# Patient Record
Sex: Female | Born: 1940
Health system: Southern US, Community
[De-identification: ages and names within clinical notes are randomized; demographics above are authoritative.]

## PROBLEM LIST (undated history)

## (undated) DIAGNOSIS — E119 Type 2 diabetes mellitus without complications: Secondary | ICD-10-CM

## (undated) DIAGNOSIS — G4730 Sleep apnea, unspecified: Secondary | ICD-10-CM

## (undated) DIAGNOSIS — J45909 Unspecified asthma, uncomplicated: Secondary | ICD-10-CM

## (undated) DIAGNOSIS — I219 Acute myocardial infarction, unspecified: Secondary | ICD-10-CM

## (undated) DIAGNOSIS — N289 Disorder of kidney and ureter, unspecified: Secondary | ICD-10-CM

## (undated) DIAGNOSIS — I10 Essential (primary) hypertension: Secondary | ICD-10-CM

## (undated) DIAGNOSIS — E785 Hyperlipidemia, unspecified: Secondary | ICD-10-CM

## (undated) HISTORY — DX: Hyperlipidemia, unspecified: E78.5

## (undated) HISTORY — PX: CAROTID STENT: SHX1301

## (undated) HISTORY — PX: TUBAL LIGATION: SHX77

## (undated) HISTORY — DX: Sleep apnea, unspecified: G47.30

## (undated) HISTORY — DX: Disorder of kidney and ureter, unspecified: N28.9

## (undated) HISTORY — DX: Type 2 diabetes mellitus without complications: E11.9

## (undated) HISTORY — PX: VESICOVAGINAL FISTULA CLOSURE W/ TAH: SUR271

## (undated) HISTORY — PX: CHOLECYSTECTOMY: SHX55

## (undated) HISTORY — DX: Unspecified asthma, uncomplicated: J45.909

## (undated) HISTORY — DX: Acute myocardial infarction, unspecified: I21.9

## (undated) HISTORY — DX: Essential (primary) hypertension: I10

## (undated) HISTORY — PX: ABDOMINAL HYSTERECTOMY: SHX81

---

## 1998-03-14 ENCOUNTER — Ambulatory Visit (HOSPITAL_COMMUNITY): Admission: RE | Admit: 1998-03-14 | Discharge: 1998-03-14 | Payer: Self-pay | Admitting: *Deleted

## 1998-10-31 ENCOUNTER — Ambulatory Visit (HOSPITAL_COMMUNITY): Admission: RE | Admit: 1998-10-31 | Discharge: 1998-10-31 | Payer: Self-pay | Admitting: *Deleted

## 1999-05-29 ENCOUNTER — Ambulatory Visit (HOSPITAL_COMMUNITY): Admission: RE | Admit: 1999-05-29 | Discharge: 1999-05-29 | Payer: Self-pay | Admitting: *Deleted

## 2000-02-19 ENCOUNTER — Ambulatory Visit (HOSPITAL_COMMUNITY): Admission: RE | Admit: 2000-02-19 | Discharge: 2000-02-19 | Payer: Self-pay | Admitting: *Deleted

## 2001-07-09 ENCOUNTER — Other Ambulatory Visit: Admission: RE | Admit: 2001-07-09 | Discharge: 2001-07-09 | Payer: Self-pay | Admitting: Obstetrics and Gynecology

## 2003-01-18 ENCOUNTER — Emergency Department (HOSPITAL_COMMUNITY): Admission: EM | Admit: 2003-01-18 | Discharge: 2003-01-18 | Payer: Self-pay | Admitting: Emergency Medicine

## 2003-01-18 ENCOUNTER — Other Ambulatory Visit: Admission: RE | Admit: 2003-01-18 | Discharge: 2003-01-18 | Payer: Self-pay | Admitting: Obstetrics and Gynecology

## 2004-02-16 ENCOUNTER — Other Ambulatory Visit: Admission: RE | Admit: 2004-02-16 | Discharge: 2004-02-16 | Payer: Self-pay | Admitting: Obstetrics and Gynecology

## 2004-02-20 ENCOUNTER — Encounter: Admission: RE | Admit: 2004-02-20 | Discharge: 2004-02-20 | Payer: Self-pay | Admitting: Obstetrics and Gynecology

## 2011-08-06 DIAGNOSIS — Z794 Long term (current) use of insulin: Secondary | ICD-10-CM | POA: Insufficient documentation

## 2011-08-06 DIAGNOSIS — Z8719 Personal history of other diseases of the digestive system: Secondary | ICD-10-CM

## 2011-08-06 DIAGNOSIS — N184 Chronic kidney disease, stage 4 (severe): Secondary | ICD-10-CM | POA: Insufficient documentation

## 2011-08-06 DIAGNOSIS — I701 Atherosclerosis of renal artery: Secondary | ICD-10-CM | POA: Insufficient documentation

## 2011-08-06 DIAGNOSIS — G4733 Obstructive sleep apnea (adult) (pediatric): Secondary | ICD-10-CM | POA: Insufficient documentation

## 2011-08-06 DIAGNOSIS — E119 Type 2 diabetes mellitus without complications: Secondary | ICD-10-CM

## 2011-08-06 DIAGNOSIS — J4530 Mild persistent asthma, uncomplicated: Secondary | ICD-10-CM

## 2011-08-06 DIAGNOSIS — I129 Hypertensive chronic kidney disease with stage 1 through stage 4 chronic kidney disease, or unspecified chronic kidney disease: Secondary | ICD-10-CM

## 2011-08-06 HISTORY — DX: Type 2 diabetes mellitus without complications: Z79.4

## 2011-08-06 HISTORY — DX: Mild persistent asthma, uncomplicated: J45.30

## 2011-08-06 HISTORY — DX: Type 2 diabetes mellitus without complications: E11.9

## 2011-08-06 HISTORY — DX: Chronic kidney disease, stage 4 (severe): N18.4

## 2011-08-06 HISTORY — DX: Hypertensive chronic kidney disease with stage 1 through stage 4 chronic kidney disease, or unspecified chronic kidney disease: I12.9

## 2011-08-06 HISTORY — DX: Obstructive sleep apnea (adult) (pediatric): G47.33

## 2011-08-06 HISTORY — DX: Personal history of other diseases of the digestive system: Z87.19

## 2011-09-24 DIAGNOSIS — E278 Other specified disorders of adrenal gland: Secondary | ICD-10-CM

## 2011-09-24 HISTORY — DX: Other specified disorders of adrenal gland: E27.8

## 2012-01-02 DIAGNOSIS — D509 Iron deficiency anemia, unspecified: Secondary | ICD-10-CM

## 2012-01-02 HISTORY — DX: Hypomagnesemia: E83.42

## 2012-01-02 HISTORY — DX: Iron deficiency anemia, unspecified: D50.9

## 2012-01-22 ENCOUNTER — Encounter (INDEPENDENT_AMBULATORY_CARE_PROVIDER_SITE_OTHER): Payer: Self-pay | Admitting: Ophthalmology

## 2012-01-29 ENCOUNTER — Encounter (INDEPENDENT_AMBULATORY_CARE_PROVIDER_SITE_OTHER): Payer: Medicare Other | Admitting: Ophthalmology

## 2012-01-29 DIAGNOSIS — H43819 Vitreous degeneration, unspecified eye: Secondary | ICD-10-CM

## 2012-01-29 DIAGNOSIS — H2510 Age-related nuclear cataract, unspecified eye: Secondary | ICD-10-CM

## 2012-01-29 DIAGNOSIS — H35039 Hypertensive retinopathy, unspecified eye: Secondary | ICD-10-CM

## 2012-01-29 DIAGNOSIS — H35059 Retinal neovascularization, unspecified, unspecified eye: Secondary | ICD-10-CM

## 2012-01-29 DIAGNOSIS — D3130 Benign neoplasm of unspecified choroid: Secondary | ICD-10-CM

## 2012-01-29 DIAGNOSIS — E11319 Type 2 diabetes mellitus with unspecified diabetic retinopathy without macular edema: Secondary | ICD-10-CM

## 2012-02-26 ENCOUNTER — Encounter (INDEPENDENT_AMBULATORY_CARE_PROVIDER_SITE_OTHER): Payer: Medicare Other | Admitting: Ophthalmology

## 2012-02-26 DIAGNOSIS — E11319 Type 2 diabetes mellitus with unspecified diabetic retinopathy without macular edema: Secondary | ICD-10-CM

## 2012-02-26 DIAGNOSIS — H43399 Other vitreous opacities, unspecified eye: Secondary | ICD-10-CM

## 2012-02-26 DIAGNOSIS — H35039 Hypertensive retinopathy, unspecified eye: Secondary | ICD-10-CM

## 2012-02-26 DIAGNOSIS — H2510 Age-related nuclear cataract, unspecified eye: Secondary | ICD-10-CM

## 2012-02-26 DIAGNOSIS — E1165 Type 2 diabetes mellitus with hyperglycemia: Secondary | ICD-10-CM

## 2012-02-26 DIAGNOSIS — E1139 Type 2 diabetes mellitus with other diabetic ophthalmic complication: Secondary | ICD-10-CM

## 2012-02-26 DIAGNOSIS — I10 Essential (primary) hypertension: Secondary | ICD-10-CM

## 2012-02-26 DIAGNOSIS — H35059 Retinal neovascularization, unspecified, unspecified eye: Secondary | ICD-10-CM

## 2012-03-04 ENCOUNTER — Ambulatory Visit (INDEPENDENT_AMBULATORY_CARE_PROVIDER_SITE_OTHER): Payer: Medicare Other | Admitting: Ophthalmology

## 2012-03-04 DIAGNOSIS — H3530 Unspecified macular degeneration: Secondary | ICD-10-CM

## 2012-03-04 DIAGNOSIS — H35039 Hypertensive retinopathy, unspecified eye: Secondary | ICD-10-CM

## 2012-03-04 DIAGNOSIS — E11319 Type 2 diabetes mellitus with unspecified diabetic retinopathy without macular edema: Secondary | ICD-10-CM

## 2012-03-04 DIAGNOSIS — H35059 Retinal neovascularization, unspecified, unspecified eye: Secondary | ICD-10-CM

## 2012-04-08 ENCOUNTER — Encounter (INDEPENDENT_AMBULATORY_CARE_PROVIDER_SITE_OTHER): Payer: Medicare Other | Admitting: Ophthalmology

## 2012-04-08 DIAGNOSIS — H35059 Retinal neovascularization, unspecified, unspecified eye: Secondary | ICD-10-CM

## 2012-07-15 ENCOUNTER — Encounter (INDEPENDENT_AMBULATORY_CARE_PROVIDER_SITE_OTHER): Payer: Medicare Other | Admitting: Ophthalmology

## 2012-07-15 DIAGNOSIS — H2510 Age-related nuclear cataract, unspecified eye: Secondary | ICD-10-CM

## 2012-07-15 DIAGNOSIS — E1139 Type 2 diabetes mellitus with other diabetic ophthalmic complication: Secondary | ICD-10-CM

## 2012-07-15 DIAGNOSIS — H35039 Hypertensive retinopathy, unspecified eye: Secondary | ICD-10-CM

## 2012-07-15 DIAGNOSIS — I10 Essential (primary) hypertension: Secondary | ICD-10-CM

## 2012-07-15 DIAGNOSIS — H43819 Vitreous degeneration, unspecified eye: Secondary | ICD-10-CM

## 2012-07-15 DIAGNOSIS — H35059 Retinal neovascularization, unspecified, unspecified eye: Secondary | ICD-10-CM

## 2012-07-15 DIAGNOSIS — E11319 Type 2 diabetes mellitus with unspecified diabetic retinopathy without macular edema: Secondary | ICD-10-CM

## 2012-08-26 DIAGNOSIS — I219 Acute myocardial infarction, unspecified: Secondary | ICD-10-CM | POA: Insufficient documentation

## 2012-08-26 HISTORY — DX: Acute myocardial infarction, unspecified: I21.9

## 2013-01-13 ENCOUNTER — Ambulatory Visit (INDEPENDENT_AMBULATORY_CARE_PROVIDER_SITE_OTHER): Payer: Medicare Other | Admitting: Ophthalmology

## 2013-01-13 DIAGNOSIS — I10 Essential (primary) hypertension: Secondary | ICD-10-CM

## 2013-01-13 DIAGNOSIS — H43819 Vitreous degeneration, unspecified eye: Secondary | ICD-10-CM

## 2013-01-13 DIAGNOSIS — H35039 Hypertensive retinopathy, unspecified eye: Secondary | ICD-10-CM

## 2013-01-13 DIAGNOSIS — E11319 Type 2 diabetes mellitus with unspecified diabetic retinopathy without macular edema: Secondary | ICD-10-CM

## 2013-01-13 DIAGNOSIS — E1165 Type 2 diabetes mellitus with hyperglycemia: Secondary | ICD-10-CM

## 2013-01-22 DIAGNOSIS — E559 Vitamin D deficiency, unspecified: Secondary | ICD-10-CM

## 2013-01-22 HISTORY — DX: Vitamin D deficiency, unspecified: E55.9

## 2013-07-26 ENCOUNTER — Ambulatory Visit (INDEPENDENT_AMBULATORY_CARE_PROVIDER_SITE_OTHER): Payer: Medicare Other | Admitting: Ophthalmology

## 2014-01-07 ENCOUNTER — Encounter (INDEPENDENT_AMBULATORY_CARE_PROVIDER_SITE_OTHER): Payer: Medicare Other | Admitting: Ophthalmology

## 2014-01-07 DIAGNOSIS — E1139 Type 2 diabetes mellitus with other diabetic ophthalmic complication: Secondary | ICD-10-CM

## 2014-01-07 DIAGNOSIS — E1165 Type 2 diabetes mellitus with hyperglycemia: Secondary | ICD-10-CM

## 2014-01-07 DIAGNOSIS — H35039 Hypertensive retinopathy, unspecified eye: Secondary | ICD-10-CM

## 2014-01-07 DIAGNOSIS — H43819 Vitreous degeneration, unspecified eye: Secondary | ICD-10-CM

## 2014-01-07 DIAGNOSIS — H3530 Unspecified macular degeneration: Secondary | ICD-10-CM

## 2014-01-07 DIAGNOSIS — I10 Essential (primary) hypertension: Secondary | ICD-10-CM

## 2014-01-07 DIAGNOSIS — E11319 Type 2 diabetes mellitus with unspecified diabetic retinopathy without macular edema: Secondary | ICD-10-CM

## 2014-08-04 ENCOUNTER — Ambulatory Visit (INDEPENDENT_AMBULATORY_CARE_PROVIDER_SITE_OTHER): Payer: Medicare Other | Admitting: Internal Medicine

## 2014-08-04 ENCOUNTER — Encounter: Payer: Self-pay | Admitting: Internal Medicine

## 2014-08-04 VITALS — BP 102/58 | HR 78 | Ht 68.0 in | Wt 174.0 lb

## 2014-08-04 DIAGNOSIS — G4700 Insomnia, unspecified: Secondary | ICD-10-CM

## 2014-08-04 DIAGNOSIS — R0609 Other forms of dyspnea: Secondary | ICD-10-CM

## 2014-08-04 DIAGNOSIS — G4733 Obstructive sleep apnea (adult) (pediatric): Secondary | ICD-10-CM

## 2014-08-04 DIAGNOSIS — R0600 Dyspnea, unspecified: Secondary | ICD-10-CM

## 2014-08-04 HISTORY — DX: Insomnia, unspecified: G47.00

## 2014-08-04 HISTORY — DX: Other forms of dyspnea: R06.09

## 2014-08-04 HISTORY — DX: Dyspnea, unspecified: R06.00

## 2014-08-04 NOTE — Assessment & Plan Note (Signed)
She had mild obstructive sleep apnea in 2012 but failed to tolerate CPAP. It doesn't sound as if she got adequate instruction or adjustment from [place]. She is not eager to try again with the different homecare company. I discussed the availability of oral appliances and she would like to learn more about this. Plan-orthodontic referral to Dr. Ron Parker for consideration of oral appliance to treat sleep apnea.

## 2014-08-04 NOTE — Patient Instructions (Signed)
Order- referral to orthodontist Dr Ron Parker to consider oral appliance for OSA  Try otc melatonin 3-6 mg, about an hour before bedtime  Try otc benadryl/ diphenhydramine a few minutes before bedtime to help sleep.

## 2014-08-04 NOTE — Progress Notes (Signed)
08/04/14- 89 yoF never smoker referred courtesy of Dr Allena Katz; had sleep study about 2-3 years ago at [facility]. NPSG 12/05/10([place]) AHI 14.2/ hr, weight 178 lbs.  She tried CPAP but did not tolerate fullface mask and feels she got inadequate support from [place] at that time. She describes difficulty initiating sleep, more than repeated waking Husband tells her she snores. Some daytime sleepiness if sitting quietly. One cup of coffee. Occasional nap is helpful. He never takes sleeping pill. Bedtime between 1 and 2 AM when her husband gets home from his second shift job. Sleep latency 1 hour, waking 2 or 3 times before up at 9 AM. History of TMJ surgery, HBP, MI/stent, DM, kidney disease, gout. Additional Problem: She asks why she scored poorly on office spirometry at Dr Bruna Potter. History of repeated pneumonia when she was Telesia Ates.  Prior to Admission medications   Medication Sig Start Date End Date Taking? Authorizing Provider  albuterol (PROVENTIL HFA;VENTOLIN HFA) 108 (90 BASE) MCG/ACT inhaler Inhale 2 puffs into the lungs every 6 (six) hours as needed for wheezing or shortness of breath.   Yes Historical Provider, MD  allopurinol (ZYLOPRIM) 100 MG tablet Take 100 mg by mouth daily. 07/20/14  Yes Historical Provider, MD  amLODipine (NORVASC) 5 MG tablet Take 5 mg by mouth 2 (two) times daily. 06/06/14  Yes Historical Provider, MD  aspirin 81 MG tablet Take 81 mg by mouth daily.   Yes Historical Provider, MD  atorvastatin (LIPITOR) 80 MG tablet Take 80 mg by mouth at bedtime. 07/09/14  Yes Historical Provider, MD  B-D UF III MINI PEN NEEDLES 31G X 5 MM MISC  07/09/14  Yes Historical Provider, MD  BRILINTA 90 MG TABS tablet Take 90 mg by mouth 2 (two) times daily. 06/17/14  Yes Historical Provider, MD  calcium carbonate (OS-CAL) 600 MG TABS tablet Take 600 mg by mouth daily with breakfast.   Yes Historical Provider, MD  carvedilol (COREG) 12.5 MG tablet Take 12.5 mg by mouth 2 (two)  times daily. 07/09/14  Yes Historical Provider, MD  Cholecalciferol (VITAMIN D) 2000 UNITS CAPS Take 1 capsule by mouth daily.   Yes Historical Provider, MD  clindamycin (CLEOCIN) 300 MG capsule Take 300 mg by mouth 3 (three) times daily. 07/30/14  Yes Historical Provider, MD  Coenzyme Q10 (COQ10) 200 MG CAPS Take 1 capsule by mouth daily.   Yes Historical Provider, MD  COLCRYS 0.6 MG tablet  07/30/14  Yes Historical Provider, MD  furosemide (LASIX) 40 MG tablet  06/06/14  Yes Historical Provider, MD  HUMALOG KWIKPEN 100 UNIT/ML KiwkPen  07/11/14  Yes Historical Provider, MD  hydrALAZINE (APRESOLINE) 50 MG tablet  07/23/14  Yes Historical Provider, MD  HYDROcodone-acetaminophen (NORCO/VICODIN) 5-325 MG per tablet Take 1 tablet by mouth 2 (two) times daily as needed. 07/30/14  Yes Historical Provider, MD  LANTUS SOLOSTAR 100 UNIT/ML Solostar Pen  06/17/14  Yes Historical Provider, MD  losartan (COZAAR) 50 MG tablet  06/16/14  Yes Historical Provider, MD  montelukast (SINGULAIR) 10 MG tablet Take 10 mg by mouth daily. 06/06/14  Yes Historical Provider, MD  Multiple Vitamin (MULTIVITAMIN) tablet Take 1 tablet by mouth daily.   Yes Historical Provider, MD  NITROSTAT 0.4 MG SL tablet  07/11/14  Yes Historical Provider, MD  QVAR 80 MCG/ACT inhaler  05/23/14  Yes Historical Provider, MD   Past Medical History  Diagnosis Date  . Hypertension   . Heart attack   . Asthma   . Diabetes   .  Hyperlipidemia   . Kidney disease   . Sleep apnea    Past Surgical History  Procedure Laterality Date  . Cholecystectomy    . Carotid stent    . Vesicovaginal fistula closure w/ tah    . Tubal ligation     Family History  Problem Relation Age of Onset  . Heart disease Father   . Heart disease Mother   . Heart disease Maternal Grandfather   . Heart disease Maternal Grandmother   . Heart disease Paternal Grandfather   . Heart disease Paternal Grandmother   . Lung cancer Brother   . Diabetes Sister   .  Diabetes Sister    History   Social History  . Marital Status: Married    Spouse Name: N/A    Number of Children: N/A  . Years of Education: N/A   Occupational History  . retired    Social History Main Topics  . Smoking status: Never Smoker   . Smokeless tobacco: Not on file  . Alcohol Use: No  . Drug Use: No  . Sexual Activity: Not on file   Other Topics Concern  . Not on file   Social History Narrative  . No narrative on file   ROS-see HPI Constitutional:   No-   weight loss, night sweats, fevers, chills, fatigue, lassitude. HEENT:   No-  headaches, difficulty swallowing, tooth/dental problems, sore throat,       No-  sneezing, itching, ear ache, + nasal congestion, post nasal drip,  CV:  No-   chest pain, orthopnea, PND, + swelling in lower extremities, anasarca,                                  dizziness, + palpitations Resp: No-   shortness of breath with exertion or at rest.              No-   productive cough,  No non-productive cough,  No- coughing up of blood.              No-   change in color of mucus.  No- wheezing.   Skin: No-   rash or lesions. GI:  No-   heartburn, + indigestion, abdominal pain, nausea, vomiting, diarrhea,                 change in bowel habits, loss of appetite GU: No-   dysuria, change in color of urine, no urgency or frequency.  No- flank pain. MS:  + joint pain or swelling.  No- decreased range of motion.  No- back pain. Neuro-     nothing unusual Psych:  No- change in mood or affect. No depression or anxiety.  No memory loss.  OBJ- Physical Exam General- Alert, Oriented, Affect-appropriate, Distress- none acute Skin- rash-none, lesions- none, excoriation- none Lymphadenopathy- none Head- atraumatic            Eyes- Gross vision intact, PERRLA, conjunctivae and secretions clear            Ears- Hearing, canals-normal            Nose- Clear, no-Septal dev, mucus, polyps, erosion, perforation             Throat- Mallampati III ,  mucosa clear , drainage- none, tonsils- atrophic Neck- flexible , trachea midline, no stridor , thyroid nl, carotid no bruit Chest - symmetrical excursion , unlabored  Heart/CV- RRR , no murmur , no gallop  , no rub, nl s1 s2                           - JVD- none , edema- none, stasis changes- none, varices- none           Lung- clear to P&A, wheeze- none, cough- none , dullness-none, rub- none           Chest wall-  Abd- tender-no, distended-no, bowel sounds-present, HSM- no Br/ Gen/ Rectal- Not done, not indicated Extrem- cyanosis- none, clubbing, none, atrophy- none, strength- nl. L big toe red in shoe, c/w gout Neuro- grossly intact to observation

## 2014-08-04 NOTE — Assessment & Plan Note (Signed)
She was under the impression that we were to evaluate her breathing. This was not communicated from [place], who has already been analyzing her breathing and has referred her to cardiology. Plan-we can assess from a pulmonary standpoint if requested.

## 2014-08-04 NOTE — Assessment & Plan Note (Signed)
Despite staying up late habitually, to wait for her husband, she reports needing as much as an hour more to fall asleep and waking 2 or 3 times during the night with no specific disturbance recognized. Plan-educate on sleep hygiene. Try melatonin and/or diphenhydramine to help sleep schedule and sleep onset.

## 2014-10-07 ENCOUNTER — Encounter: Payer: Self-pay | Admitting: Internal Medicine

## 2014-10-07 ENCOUNTER — Other Ambulatory Visit: Payer: Medicare Other

## 2014-10-07 ENCOUNTER — Ambulatory Visit (INDEPENDENT_AMBULATORY_CARE_PROVIDER_SITE_OTHER): Payer: Medicare Other | Admitting: Internal Medicine

## 2014-10-07 VITALS — BP 138/60 | HR 79 | Ht 68.0 in | Wt 174.0 lb

## 2014-10-07 DIAGNOSIS — R0609 Other forms of dyspnea: Secondary | ICD-10-CM

## 2014-10-07 DIAGNOSIS — G4733 Obstructive sleep apnea (adult) (pediatric): Secondary | ICD-10-CM

## 2014-10-07 DIAGNOSIS — J432 Centrilobular emphysema: Secondary | ICD-10-CM

## 2014-10-07 DIAGNOSIS — K219 Gastro-esophageal reflux disease without esophagitis: Secondary | ICD-10-CM

## 2014-10-07 HISTORY — DX: Gastro-esophageal reflux disease without esophagitis: K21.9

## 2014-10-07 NOTE — Assessment & Plan Note (Signed)
Grew up in a smoking family. She was told by Dr. Neldon Mc that her breathing scores were "20% down".   Plan-formal PFT. Alpha I antitrypsin assay

## 2014-10-07 NOTE — Progress Notes (Signed)
08/04/14- 20 yoF never smoker referred courtesy of Dr Allena Katz; had sleep study about 2-3 years ago at [facility]. NPSG 12/05/10([place]) AHI 14.2/ hr, weight 178 lbs.  She tried CPAP but did not tolerate fullface mask and feels she got inadequate support from [place] at that time. She describes difficulty initiating sleep, more than repeated waking Husband tells her she snores. Some daytime sleepiness if sitting quietly. One cup of coffee. Occasional nap is helpful. He never takes sleeping pill. Bedtime between 1 and 2 AM when her husband gets home from his second shift job. Sleep latency 1 hour, waking 2 or 3 times before up at 9 AM. History of TMJ surgery, HBP, MI/stent, DM, kidney disease, gout. Additional Problem: She asks why she scored poorly on office spirometry at Dr Bruna Potter. 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  10/07/14- 74 yoF never smoker referred courtesy of Dr Allena Katz; had sleep study about 2-3 years ago at [place]: Pt has not yet seen Dr. Ron Parker d/t insurance not covering visit. Pt denies any changes. Pt stated she is to see Dr. Neldon Mc next week.  She is now interested in trying CPAP with a nasal pillows mask which she thinks she could tolerate better. Aware of reflux. Says cardiologist told her to stop acid blocker because it was not good for her heart (?). We discussed reflux precautions. Ex line family history of COPD and lung cancer. She grew up in a smoking family. Asks again about her lung status-reporting some wheeze and cough  ROS Constitutional:   No-   weight loss, night sweats, fevers, chills, fatigue, lassitude. HEENT:   No-  headaches, difficulty swallowing, tooth/dental problems, sore throat,       No-  sneezing, itching, ear ache, + nasal congestion, post nasal drip,  CV:  No-   chest pain, orthopnea, PND, + swelling in lower extremities, anasarca,                                  dizziness, + palpitations Resp: No-    shortness of breath with exertion or at rest.              No-   productive cough,  No non-productive cough,  No- coughing up of blood.              No-   change in color of mucus.  No- wheezing.   Skin: No-   rash or lesions. GI:  No-   heartburn, + indigestion, abdominal pain, nausea, vomiting,  GU:  MS:  + joint pain or swelling.   Neuro-     nothing unusual Psych:  No- change in mood or affect. No depression or anxiety.  No memory loss.  OBJ- Physical Exam General- Alert, Oriented, Affect-appropriate, Distress- none acute Skin- rash-none, lesions- none, excoriation- none Lymphadenopathy- none Head- atraumatic            Eyes- Gross vision intact, PERRLA, conjunctivae and secretions clear            Ears- Hearing, canals-normal            Nose- Clear, no-Septal dev, mucus, polyps, erosion, perforation             Throat- Mallampati III , mucosa clear , drainage- none, tonsils- atrophic Neck- flexible , trachea midline, no stridor , thyroid nl, carotid no bruit Chest - symmetrical excursion , unlabored  Heart/CV- RRR , no murmur , no gallop  , no rub, nl s1 s2                           - JVD- none , edema- none, stasis changes- none, varices- none           Lung- clear to P&A, wheeze- none, cough- none , dullness-none, rub- none           Chest wall-  Abd-  Br/ Gen/ Rectal- Not done, not indicated Extrem- cyanosis- none, clubbing, none, atrophy- none, strength- nl. Neuro- grossly intact to observation

## 2014-10-07 NOTE — Patient Instructions (Signed)
Order- new DME new CPAP auto 5-15 nasal pillows mask   Dx OSA based on NPSG in 2012 (in media)  Order- Schedule PFT   Dx COPD with centrilobular emphysema              Lab    a1AT assay

## 2014-10-07 NOTE — Assessment & Plan Note (Signed)
Insurance would not cover oral appliance. Plan-discussed CPAP and options. Start CPAP with auto titration and nasal pillows mask

## 2014-10-07 NOTE — Assessment & Plan Note (Signed)
Says her cardiologist took her off of her acid blocker. She thought the problem was confusion of symptoms between reflux and heart pain. I asked her to clarify this and meanwhile emphasized reflux precautions.

## 2014-10-11 ENCOUNTER — Telehealth: Payer: Self-pay | Admitting: Internal Medicine

## 2014-10-11 DIAGNOSIS — G4733 Obstructive sleep apnea (adult) (pediatric): Secondary | ICD-10-CM

## 2014-10-11 NOTE — Telephone Encounter (Signed)
Spoke with representative at [facility] and she needs order sent that states " All Related CPAP supplies" .  Order placed.

## 2014-10-13 LAB — ALPHA-1 ANTITRYPSIN PHENOTYPE: A-1 Antitrypsin: 123 mg/dL (ref 83–199)

## 2014-11-07 ENCOUNTER — Ambulatory Visit (INDEPENDENT_AMBULATORY_CARE_PROVIDER_SITE_OTHER): Payer: Medicare Other | Admitting: Internal Medicine

## 2014-11-07 DIAGNOSIS — J432 Centrilobular emphysema: Secondary | ICD-10-CM

## 2014-11-07 LAB — PULMONARY FUNCTION TEST
DL/VA % pred: 66 %
DL/VA: 3.51 ml/min/mmHg/L
DLCO UNC: 18.08 ml/min/mmHg
DLCO unc % pred: 60 %
FEF 25-75 POST: 2.42 L/s
FEF 25-75 PRE: 2.21 L/s
FEF2575-%Change-Post: 9 %
FEF2575-%PRED-PRE: 110 %
FEF2575-%Pred-Post: 120 %
FEV1-%Change-Post: 1 %
FEV1-%Pred-Post: 103 %
FEV1-%Pred-Pre: 101 %
FEV1-POST: 2.66 L
FEV1-Pre: 2.62 L
FEV1FVC-%CHANGE-POST: 5 %
FEV1FVC-%Pred-Pre: 102 %
FEV6-%CHANGE-POST: -2 %
FEV6-%Pred-Post: 101 %
FEV6-%Pred-Pre: 103 %
FEV6-POST: 3.28 L
FEV6-Pre: 3.37 L
FEV6FVC-%CHANGE-POST: 0 %
FEV6FVC-%PRED-POST: 104 %
FEV6FVC-%Pred-Pre: 103 %
FVC-%CHANGE-POST: -3 %
FVC-%PRED-POST: 96 %
FVC-%PRED-PRE: 99 %
FVC-Post: 3.29 L
FVC-Pre: 3.39 L
POST FEV6/FVC RATIO: 100 %
PRE FEV1/FVC RATIO: 77 %
Post FEV1/FVC ratio: 81 %
Pre FEV6/FVC Ratio: 99 %
RV % pred: 91 %
RV: 2.24 L
TLC % pred: 96 %
TLC: 5.43 L

## 2014-11-07 NOTE — Progress Notes (Signed)
PFT done today. 

## 2014-12-09 ENCOUNTER — Telehealth: Payer: Self-pay | Admitting: Internal Medicine

## 2014-12-09 NOTE — Telephone Encounter (Signed)
Per CY-lets have patient come in on Monday 12-12-14 at 9:00am to follow up and review PFT results then. Thanks.

## 2014-12-09 NOTE — Telephone Encounter (Signed)
Pt would like to have results of her PFT that was done on 11/07/14. [facility] advised her that she will need to CY before 01/25/15 for compliance with her CPAP machine.  CY - please advise on PFT results and also if there is somewhere we can work pt in before 01/25/15. Thanks.

## 2014-12-09 NOTE — Telephone Encounter (Signed)
Attempted to contact pt. Her line was busy. Will try back.

## 2014-12-09 NOTE — Telephone Encounter (Signed)
Spoke with pt, scheduled for Monday at 9:00.  Nothing further needed.

## 2014-12-12 ENCOUNTER — Ambulatory Visit (INDEPENDENT_AMBULATORY_CARE_PROVIDER_SITE_OTHER): Payer: Medicare Other | Admitting: Internal Medicine

## 2014-12-12 ENCOUNTER — Encounter: Payer: Self-pay | Admitting: Internal Medicine

## 2014-12-12 VITALS — BP 162/60 | HR 70 | Ht 68.0 in | Wt 180.0 lb

## 2014-12-12 DIAGNOSIS — G4733 Obstructive sleep apnea (adult) (pediatric): Secondary | ICD-10-CM | POA: Diagnosis not present

## 2014-12-12 DIAGNOSIS — R0609 Other forms of dyspnea: Secondary | ICD-10-CM | POA: Diagnosis not present

## 2014-12-12 NOTE — Patient Instructions (Addendum)
We can continue CPAP at present settings. Ok to turn the humidifier down or off, and ok to use a nasal saline gel for comfort as needed.  Please call as needed

## 2014-12-12 NOTE — Progress Notes (Signed)
08/04/14- 87 yoF never smoker referred courtesy of Dr Allena Katz; had sleep study about 2-3 years ago at [facility]. NPSG 12/05/10([place]) AHI 14.2/ hr, weight 178 lbs.  She tried CPAP but did not tolerate fullface mask and feels she got inadequate support from [place] at that time. She describes difficulty initiating sleep, more than repeated waking Husband tells her she snores. Some daytime sleepiness if sitting quietly. One cup of coffee. Occasional nap is helpful. He never takes sleeping pill. Bedtime between 1 and 2 AM when her husband gets home from his second shift job. Sleep latency 1 hour, waking 2 or 3 times before up at 9 AM. History of TMJ surgery, HBP, MI/stent, DM, kidney disease, gout. Additional Problem: She asks why she scored poorly on office spirometry at Dr Bruna Potter. 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  10/07/14- 43 yoF never smoker referred courtesy of Dr Allena Katz; had sleep study about 2-3 years ago at [place]: Pt has not yet seen Dr. Ron Parker d/t insurance not covering visit. Pt denies any changes. Pt stated she is to see Dr. Neldon Mc next week.  She is now interested in trying CPAP with a nasal pillows mask which she thinks she could tolerate better. Aware of reflux. Says cardiologist told her to stop acid blocker because it was not good for her heart (?). We discussed reflux precautions. Family history of COPD and lung cancer. She grew up in a smoking family. Asks again about her lung status-reporting some wheeze and cough  12/12/14- 73 yoF never smoker followed for OSA, complicated by CAD/MI/stent, asthma PFT 11/07/14-  Moderate Diffusion deficit 60%. Normal flows and lung volumes with insignificant response to bronchodilator. a1AT- 10/07/14-  Nl MM 123 OK FOLLOW FOR:  PFT results.  wears CPAP Auto/ [facility] every night.  sometimes the air in her cpap feels warm, like she's breathing her own air.   Most recent chest  x-ray at [facility] February 2016 also had chest x-ray in [facility]. Results requested. PFT 11/07/14- moderate diffusion defect, normal spirometry flows with insignificant response to bronchodilator, normal lung volumes. DLCO 60% predicted. Asthma managed by Dr. Neldon Mc and she says doing well on Qvar.  ROS Constitutional:   No-   weight loss, night sweats, fevers, chills, fatigue, lassitude. HEENT:   No-  headaches, difficulty swallowing, tooth/dental problems, sore throat,       No-  sneezing, itching, ear ache, + nasal congestion, post nasal drip,  CV:  No-   chest pain, orthopnea, PND, + swelling in lower extremities, anasarca,                                              dizziness, + palpitations Resp: No-   shortness of breath with exertion or at rest.              No-   productive cough,  No non-productive cough,  No- coughing up of blood.              No-   change in color of mucus.  No- wheezing.   Skin: No-   rash or lesions. GI:  No-   heartburn, + indigestion, abdominal pain, nausea, vomiting,  GU:  MS:  + joint pain or swelling.   Neuro-     nothing unusual Psych:  No- change in  mood or affect. No depression or anxiety.  No memory loss.  OBJ- Physical Exam General- Alert, Oriented, Affect-appropriate, Distress- none acute Skin- rash-none, lesions- none, excoriation- none Lymphadenopathy- none Head- atraumatic            Eyes- Gross vision intact, PERRLA, conjunctivae and secretions clear            Ears- Hearing, canals-normal            Nose- Clear, no-Septal dev, mucus, polyps, erosion, perforation             Throat- Mallampati III , mucosa clear , drainage- none, tonsils- atrophic Neck- flexible , trachea midline, no stridor , thyroid nl, carotid no bruit Chest - symmetrical excursion , unlabored           Heart/CV- RRR , no murmur , no gallop  , no rub, nl s1 s2                           - JVD- none , edema- none, stasis changes- none, varices- none            Lung- clear to P&A, wheeze- none, cough- none , dullness-none, rub- none           Chest wall-  Abd-  Br/ Gen/ Rectal- Not done, not indicated Extrem- cyanosis- none, clubbing, none, atrophy- none, strength- nl. Neuro- grossly intact to observation

## 2015-01-13 ENCOUNTER — Ambulatory Visit (INDEPENDENT_AMBULATORY_CARE_PROVIDER_SITE_OTHER): Payer: Medicare Other | Admitting: Ophthalmology

## 2015-01-13 DIAGNOSIS — H43813 Vitreous degeneration, bilateral: Secondary | ICD-10-CM

## 2015-01-13 DIAGNOSIS — I10 Essential (primary) hypertension: Secondary | ICD-10-CM | POA: Diagnosis not present

## 2015-01-13 DIAGNOSIS — E11329 Type 2 diabetes mellitus with mild nonproliferative diabetic retinopathy without macular edema: Secondary | ICD-10-CM | POA: Diagnosis not present

## 2015-01-13 DIAGNOSIS — E11319 Type 2 diabetes mellitus with unspecified diabetic retinopathy without macular edema: Secondary | ICD-10-CM | POA: Diagnosis not present

## 2015-01-13 DIAGNOSIS — H35033 Hypertensive retinopathy, bilateral: Secondary | ICD-10-CM

## 2015-01-13 DIAGNOSIS — H3531 Nonexudative age-related macular degeneration: Secondary | ICD-10-CM

## 2015-02-20 ENCOUNTER — Telehealth: Payer: Self-pay | Admitting: Internal Medicine

## 2015-02-20 NOTE — Telephone Encounter (Signed)
Patient says that she spoke with [facility] 4 times already and each time they told her that they received PA, she requested proof of PA and they told her that they don't do the PA at that office.  Pt asked them for the number to the office that does the PA and they would not give her the number, they told her that everything goes through that office.    Called [facility] -Spoke to Cockrell Hill at [facility], the number to call is [phone] ext [phone] for PA Called number given, spoke with Tammy, she said I need to speak to [employer] at [employer] - left message on voicemail to call back.  Awaiting call back.

## 2015-02-20 NOTE — Telephone Encounter (Signed)
She needs to call [facility] and find out what the problem is tell them about her letter Leah Walsh

## 2015-02-20 NOTE — Telephone Encounter (Signed)
Patient says she received letter from [employer] saying they will not pay for the CPAP machine because prior authorization has not been obtained through [facility].  She called [facility] and they told her that they did a prior authorization but [facility] advised her that [facility] did not get PA.  Insurance company advised patient that [place] is at fault.  She said she paid $106.89 to [facility] when she picked up machine on 11/03/14, she is worried that she may have to turn in her CPAP machine.  She said that the machine is doing her good and she doesn't want to turn it in.  She said that if she has to turn it in, she feels like she should at least get her money back that she paid on it.    [facility] - can you follow up on this?

## 2015-02-21 NOTE — Telephone Encounter (Signed)
Will forward to PCC's to assist

## 2015-02-21 NOTE — Telephone Encounter (Signed)
Called [facility] at [facility] [phone] to obtain information regarding PA Lenna Sciara is the contact at [facility] for patient's account  Spoke with Melissa at [facility] and she said that they did not get prior approval, she said that she will work on it today and contact [employer], she said that she will contact the patient once this has been done.  Called and spoke to patient, advised her of above.  Gave her the contact information for Melissa at [facility] and advised her to contact Melissa at that number if she has any problems.  Nothing further needed.

## 2015-03-12 DIAGNOSIS — I252 Old myocardial infarction: Secondary | ICD-10-CM | POA: Insufficient documentation

## 2015-03-12 DIAGNOSIS — I219 Acute myocardial infarction, unspecified: Secondary | ICD-10-CM | POA: Insufficient documentation

## 2015-03-12 HISTORY — DX: Old myocardial infarction: I25.2

## 2015-03-12 NOTE — Assessment & Plan Note (Signed)
Good compliance with CPAP auto/[facility]

## 2015-03-12 NOTE — Assessment & Plan Note (Signed)
Dyspnea reflects her cardiac disease and associated reduced diffusion capacity. Any asthma component is well controlled, managed by Dr. Neldon Mc.

## 2015-04-13 ENCOUNTER — Encounter: Payer: Self-pay | Admitting: Internal Medicine

## 2015-04-13 ENCOUNTER — Ambulatory Visit (INDEPENDENT_AMBULATORY_CARE_PROVIDER_SITE_OTHER): Payer: Medicare Other | Admitting: Internal Medicine

## 2015-04-13 VITALS — BP 122/60 | HR 66 | Wt 178.2 lb

## 2015-04-13 DIAGNOSIS — G4733 Obstructive sleep apnea (adult) (pediatric): Secondary | ICD-10-CM

## 2015-04-13 DIAGNOSIS — I2129 ST elevation (STEMI) myocardial infarction involving other sites: Secondary | ICD-10-CM | POA: Diagnosis not present

## 2015-04-13 NOTE — Patient Instructions (Signed)
We can continue CPAP auto/ [facility]  Please call us if we can help.

## 2015-04-13 NOTE — Progress Notes (Signed)
08/04/14- 10 yoF never smoker referred courtesy of Dr Allena Katz; had sleep study about 2-3 years ago at [facility]. NPSG 12/05/10([place]) AHI 14.2/ hr, weight 178 lbs.  She tried CPAP but did not tolerate fullface mask and feels she got inadequate support from [place] at that time. She describes difficulty initiating sleep, more than repeated waking Husband tells her she snores. Some daytime sleepiness if sitting quietly. One cup of coffee. Occasional nap is helpful. He never takes sleeping pill. Bedtime between 1 and 2 AM when her husband gets home from his second shift job. Sleep latency 1 hour, waking 2 or 3 times before up at 9 AM. History of TMJ surgery, HBP, MI/stent, DM, kidney disease, gout. Additional Problem: She asks why she scored poorly on office spirometry at Dr Bruna Potter. History of repeated pneumonia when she was Elasha Tess.  10/07/14- 71 yoF never smoker referred courtesy of Dr Allena Katz; had sleep study about 2-3 years ago at [place]: Pt has not yet seen Dr. Ron Parker d/t insurance not covering visit. Pt denies any changes. Pt stated she is to see Dr. Neldon Mc next week.  She is now interested in trying CPAP with a nasal pillows mask which she thinks she could tolerate better. Aware of reflux. Says cardiologist told her to stop acid blocker because it was not good for her heart (?). We discussed reflux precautions. Family history of COPD and lung cancer. She grew up in a smoking family. Asks again about her lung status-reporting some wheeze and cough  12/12/14- 73 yoF never smoker followed for OSA, complicated by CAD/MI/stent, asthma PFT 11/07/14-  Moderate Diffusion deficit 60%. Normal flows and lung volumes with insignificant response to bronchodilator. a1AT- 10/07/14-  Nl MM 123 OK FOLLOW FOR:  PFT results.  wears CPAP Auto/ [facility] every night.  sometimes the air in her cpap feels warm, like she's breathing her own air.   Most recent chest  x-ray at [facility] February 2016 also had chest x-ray in [facility]. Results requested. Asthma managed by Dr. Neldon Mc and she says doing well on Qvar.  04/13/15- 73 yoF never smoker followed for OSA, complicated by CAD/MI/stent, asthma (Dr Neldon Mc), DM2 Follows For: Wearing cpap auto/ [facility]([employer])6-7 hrs /night - Nasal pillows work well - Denies sob, cough or chest discomfort She very much likes her CPAP now and can't sleep comfortably without it. AutoPap is comfortable for her in current pressure range. She would like simpler option for travel. Denies recent cardiac problems-followed by cardiology.  ROS Constitutional:   No-   weight loss, night sweats, fevers, chills, fatigue, lassitude. HEENT:   No-  headaches, difficulty swallowing, tooth/dental problems, sore throat,       No-  sneezing, itching, ear ache, + nasal congestion, post nasal drip,  CV:  No-   chest pain, orthopnea, PND, + swelling in lower extremities, anasarca,                                              dizziness, + palpitations Resp: No-   shortness of breath with exertion or at rest.              No-   productive cough,  No non-productive cough,  No- coughing up of blood.              No-  change in color of mucus.  No- wheezing.   Skin: No-   rash or lesions. GI:  No-   heartburn, + indigestion, abdominal pain, nausea, vomiting,  GU:  MS:  + joint pain or swelling.   Neuro-     nothing unusual Psych:  No- change in mood or affect. No depression or anxiety.  No memory loss.  OBJ- Physical Exam General- Alert, Oriented, Affect-appropriate, Distress- none acute Skin- rash-none, lesions- none, excoriation- none Lymphadenopathy- none Head- atraumatic            Eyes- Gross vision intact, PERRLA, conjunctivae and secretions clear            Ears- Hearing, canals-normal            Nose- Clear, no-Septal dev, mucus, polyps, erosion, perforation             Throat- Mallampati III , mucosa  clear , drainage- none, tonsils- atrophic Neck- flexible , trachea midline, no stridor , thyroid nl, carotid no bruit Chest - symmetrical excursion , unlabored           Heart/CV- RRR , no murmur , no gallop  , no rub, nl s1 s2                           - JVD- none , edema- none, stasis changes- none, varices- none           Lung- clear to P&A, wheeze- none, cough- none , dullness-none, rub- none           Chest wall-  Abd-  Br/ Gen/ Rectal- Not done, not indicated Extrem- cyanosis- none, clubbing, none, atrophy- none, strength- nl. Neuro- grossly intact to observation

## 2015-04-14 NOTE — Assessment & Plan Note (Signed)
Cardiology is following

## 2015-04-14 NOTE — Assessment & Plan Note (Signed)
CPAP auto/[facility]

## 2015-04-24 ENCOUNTER — Encounter: Payer: Self-pay | Admitting: Internal Medicine

## 2015-08-30 DIAGNOSIS — L820 Inflamed seborrheic keratosis: Secondary | ICD-10-CM | POA: Diagnosis not present

## 2015-09-26 ENCOUNTER — Other Ambulatory Visit: Payer: Self-pay | Admitting: Allergy and Immunology

## 2015-11-20 DIAGNOSIS — N184 Chronic kidney disease, stage 4 (severe): Secondary | ICD-10-CM | POA: Diagnosis not present

## 2015-11-21 DIAGNOSIS — Z1231 Encounter for screening mammogram for malignant neoplasm of breast: Secondary | ICD-10-CM | POA: Diagnosis not present

## 2015-11-23 DIAGNOSIS — M898X9 Other specified disorders of bone, unspecified site: Secondary | ICD-10-CM | POA: Insufficient documentation

## 2015-11-23 DIAGNOSIS — E559 Vitamin D deficiency, unspecified: Secondary | ICD-10-CM | POA: Diagnosis not present

## 2015-11-23 DIAGNOSIS — E889 Metabolic disorder, unspecified: Secondary | ICD-10-CM

## 2015-11-23 DIAGNOSIS — N183 Chronic kidney disease, stage 3 (moderate): Secondary | ICD-10-CM | POA: Diagnosis not present

## 2015-11-23 DIAGNOSIS — I701 Atherosclerosis of renal artery: Secondary | ICD-10-CM | POA: Diagnosis not present

## 2015-11-23 DIAGNOSIS — M9080 Osteopathy in diseases classified elsewhere, unspecified site: Secondary | ICD-10-CM

## 2015-11-23 DIAGNOSIS — I129 Hypertensive chronic kidney disease with stage 1 through stage 4 chronic kidney disease, or unspecified chronic kidney disease: Secondary | ICD-10-CM | POA: Diagnosis not present

## 2015-11-23 HISTORY — DX: Metabolic disorder, unspecified: E88.9

## 2015-11-23 HISTORY — DX: Other specified disorders of bone, unspecified site: M89.8X9

## 2015-12-07 DIAGNOSIS — E119 Type 2 diabetes mellitus without complications: Secondary | ICD-10-CM | POA: Diagnosis not present

## 2015-12-13 DIAGNOSIS — Z1389 Encounter for screening for other disorder: Secondary | ICD-10-CM | POA: Diagnosis not present

## 2015-12-13 DIAGNOSIS — E119 Type 2 diabetes mellitus without complications: Secondary | ICD-10-CM | POA: Diagnosis not present

## 2015-12-13 DIAGNOSIS — Z0000 Encounter for general adult medical examination without abnormal findings: Secondary | ICD-10-CM | POA: Diagnosis not present

## 2015-12-20 ENCOUNTER — Other Ambulatory Visit: Payer: Self-pay | Admitting: Allergy and Immunology

## 2015-12-20 DIAGNOSIS — H524 Presbyopia: Secondary | ICD-10-CM | POA: Diagnosis not present

## 2015-12-20 DIAGNOSIS — H1013 Acute atopic conjunctivitis, bilateral: Secondary | ICD-10-CM | POA: Diagnosis not present

## 2015-12-20 DIAGNOSIS — H40013 Open angle with borderline findings, low risk, bilateral: Secondary | ICD-10-CM | POA: Diagnosis not present

## 2016-01-24 ENCOUNTER — Ambulatory Visit (INDEPENDENT_AMBULATORY_CARE_PROVIDER_SITE_OTHER): Payer: PPO | Admitting: Ophthalmology

## 2016-01-24 DIAGNOSIS — H353132 Nonexudative age-related macular degeneration, bilateral, intermediate dry stage: Secondary | ICD-10-CM

## 2016-01-24 DIAGNOSIS — H43813 Vitreous degeneration, bilateral: Secondary | ICD-10-CM | POA: Diagnosis not present

## 2016-01-24 DIAGNOSIS — E10311 Type 1 diabetes mellitus with unspecified diabetic retinopathy with macular edema: Secondary | ICD-10-CM | POA: Diagnosis not present

## 2016-01-24 DIAGNOSIS — H35033 Hypertensive retinopathy, bilateral: Secondary | ICD-10-CM

## 2016-01-24 DIAGNOSIS — I10 Essential (primary) hypertension: Secondary | ICD-10-CM | POA: Diagnosis not present

## 2016-01-24 DIAGNOSIS — E113293 Type 2 diabetes mellitus with mild nonproliferative diabetic retinopathy without macular edema, bilateral: Secondary | ICD-10-CM | POA: Diagnosis not present

## 2016-02-23 ENCOUNTER — Telehealth: Payer: Self-pay | Admitting: Internal Medicine

## 2016-02-23 DIAGNOSIS — G4733 Obstructive sleep apnea (adult) (pediatric): Secondary | ICD-10-CM

## 2016-02-23 NOTE — Telephone Encounter (Signed)
Spoke with pt and she would like to switch DME's due to her insurance. [facility] does not take her ins and she needs CPAP supplies. Pt requested [facility]. Order placed. Pt aware they will contact her directly. Nothing further needed.

## 2016-02-28 ENCOUNTER — Telehealth: Payer: Self-pay | Admitting: Internal Medicine

## 2016-02-28 DIAGNOSIS — D225 Melanocytic nevi of trunk: Secondary | ICD-10-CM | POA: Diagnosis not present

## 2016-02-28 DIAGNOSIS — L820 Inflamed seborrheic keratosis: Secondary | ICD-10-CM | POA: Diagnosis not present

## 2016-02-28 NOTE — Telephone Encounter (Signed)
Spoke with Melissa. They received the order we placed to change pt's DME to [facility]. [facility] is going to need a copy of the pt's insurnace card and updated OV notes. Pt has not been seen since 03/2015, [facility] needs OV notes within the past 6 months. Pt has pending OV next month with [facility]. Melissa asks that we reach out to the pt to see if we can move up her appointment with one of the NP's so that they can get the pt what she needs. Spoke with pt. She has been scheduled to see TP on 03/11/16 at 2:45pm. Nothing further was needed.

## 2016-03-04 DIAGNOSIS — I2510 Atherosclerotic heart disease of native coronary artery without angina pectoris: Secondary | ICD-10-CM | POA: Diagnosis not present

## 2016-03-04 DIAGNOSIS — R002 Palpitations: Secondary | ICD-10-CM | POA: Diagnosis not present

## 2016-03-04 DIAGNOSIS — I252 Old myocardial infarction: Secondary | ICD-10-CM | POA: Diagnosis not present

## 2016-03-04 DIAGNOSIS — I10 Essential (primary) hypertension: Secondary | ICD-10-CM | POA: Diagnosis not present

## 2016-03-04 DIAGNOSIS — Z955 Presence of coronary angioplasty implant and graft: Secondary | ICD-10-CM | POA: Diagnosis not present

## 2016-03-04 DIAGNOSIS — E785 Hyperlipidemia, unspecified: Secondary | ICD-10-CM | POA: Diagnosis not present

## 2016-03-04 DIAGNOSIS — R1013 Epigastric pain: Secondary | ICD-10-CM | POA: Diagnosis not present

## 2016-03-06 DIAGNOSIS — E119 Type 2 diabetes mellitus without complications: Secondary | ICD-10-CM | POA: Diagnosis not present

## 2016-03-06 DIAGNOSIS — E559 Vitamin D deficiency, unspecified: Secondary | ICD-10-CM | POA: Diagnosis not present

## 2016-03-11 ENCOUNTER — Ambulatory Visit (INDEPENDENT_AMBULATORY_CARE_PROVIDER_SITE_OTHER): Payer: PPO | Admitting: Adult Health

## 2016-03-11 ENCOUNTER — Encounter: Payer: Self-pay | Admitting: Adult Health

## 2016-03-11 VITALS — BP 136/70 | HR 78 | Temp 98.0°F | Ht 68.0 in | Wt 178.0 lb

## 2016-03-11 DIAGNOSIS — G4733 Obstructive sleep apnea (adult) (pediatric): Secondary | ICD-10-CM | POA: Diagnosis not present

## 2016-03-11 NOTE — Patient Instructions (Signed)
Restart CPAP At bedtime.  Wear for at least 4hr each night.  Do not drive if sleepy.  Orders sent to change to [facility].  Follow up Dr. Annamaria Boots  As planned and As needed

## 2016-03-11 NOTE — Progress Notes (Signed)
Subjective:    Patient ID: Leah Walsh, female    DOB: [birth year], 75 y.o.   MRN: [id number]  HPI 75 yo Never smoker followed for OSA PMH CAD/s/p stent , Asthma   03/11/2016 Follow up : OSA  Patient returns for a one-year follow-up for sleep apnea. Says she had been doing very well but tubing broke and is awaiting new order Her insurance changed and does not cover [facility] , requires her to change to [facility] .  She will need to change to [facility] to cover supplies.  She wants to get started back on CPAP as soon as possible , it really helped her.  Last ov with excellent compliance on autoset with avg usage at 6 hr each night.     Past Medical History  Diagnosis Date  . Hypertension   . Heart attack ([place])   . Asthma   . Diabetes ([place])   . Hyperlipidemia   . Kidney disease   . Sleep apnea    Current Outpatient Prescriptions on File Prior to Visit  Medication Sig Dispense Refill  . albuterol (PROVENTIL HFA;VENTOLIN HFA) 108 (90 BASE) MCG/ACT inhaler Inhale 2 puffs into the lungs every 6 (six) hours as needed for wheezing or shortness of breath.    . allopurinol (ZYLOPRIM) 100 MG tablet Take 100 mg by mouth daily.  3  . amLODipine (NORVASC) 5 MG tablet Take 5 mg by mouth 2 (two) times daily.  2  . aspirin 81 MG tablet Take 81 mg by mouth daily.    [name] atorvastatin (LIPITOR) 80 MG tablet Take 80 mg by mouth at bedtime.  2  . Azelastine-Fluticasone (DYMISTA) 137-50 MCG/ACT SUSP Place 1 spray into the nose 2 (two) times daily.    . B-D UF III MINI PEN NEEDLES 31G X 5 MM MISC   6  . BRILINTA 90 MG TABS tablet Take 90 mg by mouth 2 (two) times daily.  1  . calcium carbonate (OS-CAL) 600 MG TABS tablet Take 600 mg by mouth daily with breakfast.    . carvedilol (COREG) 12.5 MG tablet Take 12.5 mg by mouth 2 (two) times daily.  2  . Cholecalciferol (VITAMIN D) 2000 UNITS CAPS Take 1 capsule by mouth daily.    . Coenzyme Q10 (COQ10) 200 MG CAPS Take 1 capsule by mouth daily.    . furosemide (LASIX) 40  MG tablet   1  . hydrALAZINE (APRESOLINE) 50 MG tablet Take 50 mg by mouth 4 (four) times daily.   11  . LANTUS SOLOSTAR 100 UNIT/ML Solostar Pen 40 Units 2 (two) times daily.   6  . loratadine (CLARITIN) 10 MG tablet Take 10 mg by mouth daily as needed for allergies.    [name] losartan (COZAAR) 50 MG tablet   2  . montelukast (SINGULAIR) 10 MG tablet TAKE 1 TABLET BY MOUTH EVERY DAY 90 tablet 0  . Multiple Vitamin (MULTIVITAMIN) tablet Take 1 tablet by mouth daily.    [name] NITROSTAT 0.4 MG SL tablet Place 0.4 mg under the tongue every 5 (five) minutes as needed.   3  . QVAR 80 MCG/ACT inhaler   4  . omeprazole (PRILOSEC) 20 MG capsule Take 20 mg by mouth daily.     No current facility-administered medications on file prior to visit.     Review of Systems Constitutional:   No  weight loss, night sweats,  Fevers, chills, fatigue, or  lassitude.  HEENT:   No headaches,  Difficulty swallowing,  Tooth/dental  problems, or  Sore throat,                No sneezing, itching, ear ache, nasal congestion, post nasal drip,   CV:  No chest pain,  Orthopnea, PND, swelling in lower extremities, anasarca, dizziness, palpitations, syncope.   GI  No heartburn, indigestion, abdominal pain, nausea, vomiting, diarrhea, change in bowel habits, loss of appetite, bloody stools.   Resp: No shortness of breath with exertion or at rest.  No excess mucus, no productive cough,  No non-productive cough,  No coughing up of blood.  No change in color of mucus.  No wheezing.  No chest wall deformity  Skin: no rash or lesions.  GU: no dysuria, change in color of urine, no urgency or frequency.  No flank pain, no hematuria   MS:  No joint pain or swelling.  No decreased range of motion.  No back pain.  Psych:  No change in mood or affect. No depression or anxiety.  No memory loss.         Objective:   Physical Exam Filed Vitals:   03/11/16 1446  BP: 136/70  Pulse: 78  Temp: 98 F (36.7 C)  TempSrc: Oral    Height: 5\' 8"  (1.727 m)  Weight: 178 lb (80.74 kg)  SpO2: 98%  Body mass index is 27.07 kg/(m^2).   GEN: A/Ox3; pleasant , NAD, well nourished   HEENT:  [place]/AT,  EACs-clear, TMs-wnl, NOSE-clear, THROAT-clear, no lesions, no postnasal drip or exudate noted. Class 2-3 MP airway   NECK:  Supple w/ fair ROM; no JVD; normal carotid impulses w/o bruits; no thyromegaly or nodules palpated; no lymphadenopathy.  RESP  Clear  P & A; w/o, wheezes/ rales/ or rhonchi.no accessory muscle use, no dullness to percussion  CARD:  RRR, no m/r/g  , no peripheral edema, pulses intact, no cyanosis or clubbing.  GI:   Soft & nt; nml bowel sounds; no organomegaly or masses detected.  Musco: Warm bil, no deformities or joint swelling noted.   Neuro: alert, no focal deficits noted.    Skin: Warm, no lesions or rashes  Tammy Parrett NP-C  [facility]  03/11/2016        Assessment & Plan:

## 2016-03-11 NOTE — Assessment & Plan Note (Signed)
Good control on CPAP  unfortuanately tubing is broke and needs new supplies.  Will need to change to [facility] due to insurance coverage  Plan  Restart CPAP At bedtime.  Wear for at least 4hr each night.  Do not drive if sleepy.  Orders sent to change to [facility].  Follow up Dr. Annamaria Boots  As planned and As needed

## 2016-03-13 DIAGNOSIS — M8580 Other specified disorders of bone density and structure, unspecified site: Secondary | ICD-10-CM | POA: Diagnosis not present

## 2016-03-13 DIAGNOSIS — E119 Type 2 diabetes mellitus without complications: Secondary | ICD-10-CM | POA: Diagnosis not present

## 2016-03-13 DIAGNOSIS — I129 Hypertensive chronic kidney disease with stage 1 through stage 4 chronic kidney disease, or unspecified chronic kidney disease: Secondary | ICD-10-CM | POA: Diagnosis not present

## 2016-03-19 DIAGNOSIS — R002 Palpitations: Secondary | ICD-10-CM | POA: Diagnosis not present

## 2016-03-19 DIAGNOSIS — I129 Hypertensive chronic kidney disease with stage 1 through stage 4 chronic kidney disease, or unspecified chronic kidney disease: Secondary | ICD-10-CM | POA: Diagnosis not present

## 2016-03-19 DIAGNOSIS — E559 Vitamin D deficiency, unspecified: Secondary | ICD-10-CM | POA: Diagnosis not present

## 2016-03-19 DIAGNOSIS — N183 Chronic kidney disease, stage 3 (moderate): Secondary | ICD-10-CM | POA: Diagnosis not present

## 2016-03-19 DIAGNOSIS — D631 Anemia in chronic kidney disease: Secondary | ICD-10-CM | POA: Diagnosis not present

## 2016-03-21 DIAGNOSIS — R002 Palpitations: Secondary | ICD-10-CM | POA: Diagnosis not present

## 2016-03-21 DIAGNOSIS — I2510 Atherosclerotic heart disease of native coronary artery without angina pectoris: Secondary | ICD-10-CM | POA: Diagnosis not present

## 2016-03-25 DIAGNOSIS — I252 Old myocardial infarction: Secondary | ICD-10-CM | POA: Diagnosis not present

## 2016-03-25 DIAGNOSIS — I2510 Atherosclerotic heart disease of native coronary artery without angina pectoris: Secondary | ICD-10-CM | POA: Diagnosis not present

## 2016-03-25 DIAGNOSIS — R002 Palpitations: Secondary | ICD-10-CM | POA: Diagnosis not present

## 2016-03-25 DIAGNOSIS — R1013 Epigastric pain: Secondary | ICD-10-CM | POA: Diagnosis not present

## 2016-03-26 DIAGNOSIS — I701 Atherosclerosis of renal artery: Secondary | ICD-10-CM | POA: Diagnosis not present

## 2016-03-26 DIAGNOSIS — I129 Hypertensive chronic kidney disease with stage 1 through stage 4 chronic kidney disease, or unspecified chronic kidney disease: Secondary | ICD-10-CM | POA: Diagnosis not present

## 2016-03-26 DIAGNOSIS — M9080 Osteopathy in diseases classified elsewhere, unspecified site: Secondary | ICD-10-CM | POA: Diagnosis not present

## 2016-03-26 DIAGNOSIS — E559 Vitamin D deficiency, unspecified: Secondary | ICD-10-CM | POA: Diagnosis not present

## 2016-03-26 DIAGNOSIS — E889 Metabolic disorder, unspecified: Secondary | ICD-10-CM | POA: Diagnosis not present

## 2016-03-26 DIAGNOSIS — N183 Chronic kidney disease, stage 3 (moderate): Secondary | ICD-10-CM | POA: Diagnosis not present

## 2016-03-26 DIAGNOSIS — D631 Anemia in chronic kidney disease: Secondary | ICD-10-CM | POA: Diagnosis not present

## 2016-04-14 ENCOUNTER — Encounter: Payer: Self-pay | Admitting: Internal Medicine

## 2016-04-15 ENCOUNTER — Encounter: Payer: Self-pay | Admitting: Internal Medicine

## 2016-04-15 ENCOUNTER — Ambulatory Visit (INDEPENDENT_AMBULATORY_CARE_PROVIDER_SITE_OTHER): Payer: PPO | Admitting: Internal Medicine

## 2016-04-15 VITALS — BP 122/70 | HR 76 | Ht 68.0 in | Wt 182.4 lb

## 2016-04-15 DIAGNOSIS — G4733 Obstructive sleep apnea (adult) (pediatric): Secondary | ICD-10-CM

## 2016-04-15 DIAGNOSIS — G4700 Insomnia, unspecified: Secondary | ICD-10-CM | POA: Diagnosis not present

## 2016-04-15 NOTE — Progress Notes (Signed)
08/04/14- 75 yoF never smoker referred courtesy of Dr Allena Katz; had sleep study about 2-3 years ago at [facility]. NPSG 12/05/10([place]) AHI 14.2/ hr, weight 178 lbs.  She tried CPAP but did not tolerate fullface mask and feels she got inadequate support from [place] at that time. She describes difficulty initiating sleep, more than repeated waking Husband tells her she snores. Some daytime sleepiness if sitting quietly. One cup of coffee. Occasional nap is helpful. He never takes sleeping pill. Bedtime between 1 and 2 AM when her husband gets home from his second shift job. Sleep latency 1 hour, waking 2 or 3 times before up at 9 AM. History of TMJ surgery, HBP, MI/stent, DM, kidney disease, gout. Additional Problem: She asks why she scored poorly on office spirometry at Dr Bruna Potter. History of repeated pneumonia when she was young.  10/07/14- 75 yoF never smoker referred courtesy of Dr Allena Katz; had sleep study about 2-3 years ago at [facility]: Pt has not yet seen Dr. Ron Parker d/t insurance not covering visit. Pt denies any changes. Pt stated she is to see Dr. Neldon Mc next week.  She is now interested in trying CPAP with a nasal pillows mask which she thinks she could tolerate better. Aware of reflux. Says cardiologist told her to stop acid blocker because it was not good for her heart (?). We discussed reflux precautions. Family history of COPD and lung cancer. She grew up in a smoking family. Asks again about her lung status-reporting some wheeze and cough  12/12/14- 75 yoF never smoker followed for OSA, complicated by CAD/MI/stent, asthma PFT 11/07/14-  Moderate Diffusion deficit 60%. Normal flows and lung volumes with insignificant response to bronchodilator. a1AT- 10/07/14-  Nl MM 123 OK FOLLOW FOR:  PFT results.  wears CPAP Auto/ [facility] every night.  sometimes the air in her cpap feels warm, like she's breathing her own air.   Most recent chest  x-ray at [facility] February 2016 also had chest x-ray in [facility]. Results requested. Asthma managed by Dr. Neldon Mc and she says doing well on Qvar.  04/13/15- 75 yoF never smoker followed for OSA, complicated by CAD/MI/stent, asthma (Dr Neldon Mc), DM2 Follows For: Wearing cpap auto/ [facility]([employer])6-7 hrs /night - Nasal pillows work well - Denies sob, cough or chest discomfort She very much likes her CPAP now and can't sleep comfortably without it. AutoPap is comfortable for her in current pressure range. She would like simpler option for travel. Denies recent cardiac problems-followed by cardiology.  04/15/2016-75 year old female never smoker followed for OSA, complicated by CAD/MI/stent, asthma (Dr. Neldon Mc), DM 2 LOV- NP- 7/17- change to [facility] for insurance. CPAP auto 5-15 /[facility] FOLLOWS FOR: DME: [facility] in [place], Pt wears CPAP nightly and DL attached. Pt will need order for new supplies sent to [facility].  Needs replacement supplies. Mask was too big and blowing air in her eyes.. Admits sleeping much better with CPAP. Download indicates 63% 4 hour compliance, AHI 3.8. Wore heart monitor for tachycardia with history of MI/stent  ROS Constitutional:   No-   weight loss, night sweats, fevers, chills, fatigue, lassitude. HEENT:   No-  headaches, difficulty swallowing, tooth/dental problems, sore throat,       No-  sneezing, itching, ear ache, + nasal congestion, post nasal drip,  CV:  No-   chest pain, orthopnea, PND, + swelling in lower extremities, anasarca,  dizziness, + palpitations Resp: No-   shortness of breath with exertion or at rest.              No-   productive cough,  No non-productive cough,  No- coughing up of blood.              No-   change in color of mucus.  No- wheezing.   Skin: No-   rash or lesions. GI:  No-   heartburn, + indigestion, abdominal pain, nausea, vomiting,  GU:  MS:  + joint pain or  swelling.   Neuro-     nothing unusual Psych:  No- change in mood or affect. No depression or anxiety.  No memory loss.  OBJ- Physical Exam General- Alert, Oriented, Affect-appropriate, Distress- none acute Skin- rash-none, lesions- none, excoriation- none Lymphadenopathy- none Head- atraumatic            Eyes- Gross vision intact, PERRLA, conjunctivae and secretions clear            Ears- Hearing, canals-normal            Nose- Clear, no-Septal dev, mucus, polyps, erosion, perforation             Throat- Mallampati III , mucosa clear , drainage- none, tonsils- atrophic Neck- flexible , trachea midline, no stridor , thyroid nl, carotid no bruit Chest - symmetrical excursion , unlabored           Heart/CV- RRR , no murmur , no gallop  , no rub, nl s1 s2                           - JVD- none , edema- none, stasis changes- none, varices- none           Lung- clear to P&A, wheeze- none, cough- none , dullness-none, rub- none           Chest wall-  Abd-  Br/ Gen/ Rectal- Not done, not indicated Extrem- cyanosis- none, clubbing, none, atrophy- none, strength- nl. Neuro- grossly intact to observation

## 2016-04-15 NOTE — Patient Instructions (Addendum)
Order- DME [facility]-  Continue CPAP, current pressure range auto mask of choice, supplies, humidifier, AirView    Dx OSA  Please call as needed

## 2016-05-14 NOTE — Assessment & Plan Note (Signed)
We discussed goals for CPAP use and ways to improve comfort so that her compliance can improve. Plan-replacement CPAP mask and supplies.

## 2016-05-14 NOTE — Assessment & Plan Note (Signed)
Discussed continued attention to good sleep habits and environment to support CPAP use

## 2016-05-21 DIAGNOSIS — I129 Hypertensive chronic kidney disease with stage 1 through stage 4 chronic kidney disease, or unspecified chronic kidney disease: Secondary | ICD-10-CM | POA: Diagnosis not present

## 2016-05-21 DIAGNOSIS — N183 Chronic kidney disease, stage 3 (moderate): Secondary | ICD-10-CM | POA: Diagnosis not present

## 2016-05-27 DIAGNOSIS — I129 Hypertensive chronic kidney disease with stage 1 through stage 4 chronic kidney disease, or unspecified chronic kidney disease: Secondary | ICD-10-CM | POA: Diagnosis not present

## 2016-05-27 DIAGNOSIS — I701 Atherosclerosis of renal artery: Secondary | ICD-10-CM | POA: Diagnosis not present

## 2016-05-27 DIAGNOSIS — M9080 Osteopathy in diseases classified elsewhere, unspecified site: Secondary | ICD-10-CM | POA: Diagnosis not present

## 2016-05-27 DIAGNOSIS — N184 Chronic kidney disease, stage 4 (severe): Secondary | ICD-10-CM | POA: Diagnosis not present

## 2016-05-27 DIAGNOSIS — D631 Anemia in chronic kidney disease: Secondary | ICD-10-CM | POA: Diagnosis not present

## 2016-05-27 DIAGNOSIS — E559 Vitamin D deficiency, unspecified: Secondary | ICD-10-CM | POA: Diagnosis not present

## 2016-05-27 DIAGNOSIS — E889 Metabolic disorder, unspecified: Secondary | ICD-10-CM | POA: Diagnosis not present

## 2016-05-31 DIAGNOSIS — E161 Other hypoglycemia: Secondary | ICD-10-CM | POA: Diagnosis not present

## 2016-05-31 DIAGNOSIS — R7309 Other abnormal glucose: Secondary | ICD-10-CM | POA: Diagnosis not present

## 2016-07-01 ENCOUNTER — Telehealth: Payer: Self-pay | Admitting: Internal Medicine

## 2016-07-01 DIAGNOSIS — G4733 Obstructive sleep apnea (adult) (pediatric): Secondary | ICD-10-CM

## 2016-07-01 NOTE — Telephone Encounter (Signed)
Called spoke with patient who would like to change DME companies from [facility] to [facility] because [facility] requires a debot card be kept on file and she would like to use a check.  She is also requesting an appt to go to [facility] to pick out her mask (last order for supplies 03/2016 stated 'mask of choice), allow them the view her current machine and she would like to meet the staff.  Order placed Nothing further needed; will sign off

## 2016-07-03 DIAGNOSIS — H1131 Conjunctival hemorrhage, right eye: Secondary | ICD-10-CM | POA: Diagnosis not present

## 2016-07-03 DIAGNOSIS — H109 Unspecified conjunctivitis: Secondary | ICD-10-CM | POA: Diagnosis not present

## 2016-07-04 DIAGNOSIS — H1131 Conjunctival hemorrhage, right eye: Secondary | ICD-10-CM | POA: Diagnosis not present

## 2016-07-04 DIAGNOSIS — H01003 Unspecified blepharitis right eye, unspecified eyelid: Secondary | ICD-10-CM | POA: Diagnosis not present

## 2016-07-17 DIAGNOSIS — E113393 Type 2 diabetes mellitus with moderate nonproliferative diabetic retinopathy without macular edema, bilateral: Secondary | ICD-10-CM | POA: Diagnosis not present

## 2016-07-17 DIAGNOSIS — H35033 Hypertensive retinopathy, bilateral: Secondary | ICD-10-CM | POA: Diagnosis not present

## 2016-07-17 DIAGNOSIS — H40013 Open angle with borderline findings, low risk, bilateral: Secondary | ICD-10-CM | POA: Diagnosis not present

## 2016-07-17 DIAGNOSIS — H353130 Nonexudative age-related macular degeneration, bilateral, stage unspecified: Secondary | ICD-10-CM | POA: Diagnosis not present

## 2016-07-22 DIAGNOSIS — G4700 Insomnia, unspecified: Secondary | ICD-10-CM | POA: Diagnosis not present

## 2016-07-22 DIAGNOSIS — G4733 Obstructive sleep apnea (adult) (pediatric): Secondary | ICD-10-CM | POA: Diagnosis not present

## 2016-08-07 DIAGNOSIS — L821 Other seborrheic keratosis: Secondary | ICD-10-CM | POA: Diagnosis not present

## 2016-08-07 DIAGNOSIS — L739 Follicular disorder, unspecified: Secondary | ICD-10-CM | POA: Diagnosis not present

## 2016-08-07 DIAGNOSIS — L728 Other follicular cysts of the skin and subcutaneous tissue: Secondary | ICD-10-CM | POA: Diagnosis not present

## 2016-08-07 DIAGNOSIS — D485 Neoplasm of uncertain behavior of skin: Secondary | ICD-10-CM | POA: Diagnosis not present

## 2016-08-07 DIAGNOSIS — D225 Melanocytic nevi of trunk: Secondary | ICD-10-CM | POA: Diagnosis not present

## 2016-08-08 DIAGNOSIS — H1013 Acute atopic conjunctivitis, bilateral: Secondary | ICD-10-CM | POA: Diagnosis not present

## 2016-08-08 DIAGNOSIS — H16223 Keratoconjunctivitis sicca, not specified as Sjogren's, bilateral: Secondary | ICD-10-CM | POA: Diagnosis not present

## 2016-08-08 DIAGNOSIS — H04123 Dry eye syndrome of bilateral lacrimal glands: Secondary | ICD-10-CM | POA: Diagnosis not present

## 2016-08-08 DIAGNOSIS — H01003 Unspecified blepharitis right eye, unspecified eyelid: Secondary | ICD-10-CM | POA: Diagnosis not present

## 2016-08-15 DIAGNOSIS — N184 Chronic kidney disease, stage 4 (severe): Secondary | ICD-10-CM | POA: Diagnosis not present

## 2016-08-22 DIAGNOSIS — I701 Atherosclerosis of renal artery: Secondary | ICD-10-CM | POA: Diagnosis not present

## 2016-08-22 DIAGNOSIS — E559 Vitamin D deficiency, unspecified: Secondary | ICD-10-CM | POA: Diagnosis not present

## 2016-08-22 DIAGNOSIS — D631 Anemia in chronic kidney disease: Secondary | ICD-10-CM | POA: Diagnosis not present

## 2016-08-22 DIAGNOSIS — N184 Chronic kidney disease, stage 4 (severe): Secondary | ICD-10-CM | POA: Diagnosis not present

## 2016-08-22 DIAGNOSIS — I129 Hypertensive chronic kidney disease with stage 1 through stage 4 chronic kidney disease, or unspecified chronic kidney disease: Secondary | ICD-10-CM | POA: Diagnosis not present

## 2016-08-22 HISTORY — DX: Other disorders of phosphorus metabolism: E83.39

## 2016-09-17 DIAGNOSIS — E119 Type 2 diabetes mellitus without complications: Secondary | ICD-10-CM | POA: Diagnosis not present

## 2016-09-19 DIAGNOSIS — I2510 Atherosclerotic heart disease of native coronary artery without angina pectoris: Secondary | ICD-10-CM | POA: Diagnosis not present

## 2016-09-19 DIAGNOSIS — E119 Type 2 diabetes mellitus without complications: Secondary | ICD-10-CM | POA: Diagnosis not present

## 2016-09-19 DIAGNOSIS — J45909 Unspecified asthma, uncomplicated: Secondary | ICD-10-CM | POA: Diagnosis not present

## 2016-09-19 DIAGNOSIS — Z9181 History of falling: Secondary | ICD-10-CM | POA: Diagnosis not present

## 2016-09-19 DIAGNOSIS — Z1389 Encounter for screening for other disorder: Secondary | ICD-10-CM | POA: Diagnosis not present

## 2016-09-26 DIAGNOSIS — J111 Influenza due to unidentified influenza virus with other respiratory manifestations: Secondary | ICD-10-CM | POA: Diagnosis not present

## 2016-09-26 DIAGNOSIS — R509 Fever, unspecified: Secondary | ICD-10-CM | POA: Diagnosis not present

## 2016-09-26 DIAGNOSIS — R112 Nausea with vomiting, unspecified: Secondary | ICD-10-CM | POA: Diagnosis not present

## 2016-11-17 DIAGNOSIS — E161 Other hypoglycemia: Secondary | ICD-10-CM | POA: Diagnosis not present

## 2016-11-17 DIAGNOSIS — E162 Hypoglycemia, unspecified: Secondary | ICD-10-CM | POA: Diagnosis not present

## 2016-12-02 DIAGNOSIS — N907 Vulvar cyst: Secondary | ICD-10-CM | POA: Diagnosis not present

## 2016-12-02 DIAGNOSIS — N949 Unspecified condition associated with female genital organs and menstrual cycle: Secondary | ICD-10-CM | POA: Diagnosis not present

## 2016-12-02 DIAGNOSIS — Z01419 Encounter for gynecological examination (general) (routine) without abnormal findings: Secondary | ICD-10-CM | POA: Diagnosis not present

## 2016-12-02 DIAGNOSIS — R1031 Right lower quadrant pain: Secondary | ICD-10-CM | POA: Diagnosis not present

## 2016-12-02 DIAGNOSIS — R1909 Other intra-abdominal and pelvic swelling, mass and lump: Secondary | ICD-10-CM | POA: Diagnosis not present

## 2016-12-02 DIAGNOSIS — R1903 Right lower quadrant abdominal swelling, mass and lump: Secondary | ICD-10-CM | POA: Diagnosis not present

## 2016-12-02 DIAGNOSIS — R109 Unspecified abdominal pain: Secondary | ICD-10-CM | POA: Diagnosis not present

## 2016-12-02 DIAGNOSIS — Z1231 Encounter for screening mammogram for malignant neoplasm of breast: Secondary | ICD-10-CM | POA: Diagnosis not present

## 2016-12-03 DIAGNOSIS — E119 Type 2 diabetes mellitus without complications: Secondary | ICD-10-CM | POA: Diagnosis not present

## 2016-12-03 DIAGNOSIS — N839 Noninflammatory disorder of ovary, fallopian tube and broad ligament, unspecified: Secondary | ICD-10-CM | POA: Diagnosis not present

## 2016-12-03 DIAGNOSIS — Z6827 Body mass index (BMI) 27.0-27.9, adult: Secondary | ICD-10-CM | POA: Diagnosis not present

## 2016-12-05 DIAGNOSIS — Z7901 Long term (current) use of anticoagulants: Secondary | ICD-10-CM | POA: Diagnosis not present

## 2016-12-05 DIAGNOSIS — Z7982 Long term (current) use of aspirin: Secondary | ICD-10-CM | POA: Diagnosis not present

## 2016-12-05 DIAGNOSIS — N184 Chronic kidney disease, stage 4 (severe): Secondary | ICD-10-CM | POA: Diagnosis not present

## 2016-12-05 DIAGNOSIS — R635 Abnormal weight gain: Secondary | ICD-10-CM | POA: Diagnosis not present

## 2016-12-05 DIAGNOSIS — Z79899 Other long term (current) drug therapy: Secondary | ICD-10-CM | POA: Diagnosis not present

## 2016-12-05 DIAGNOSIS — R1909 Other intra-abdominal and pelvic swelling, mass and lump: Secondary | ICD-10-CM | POA: Diagnosis not present

## 2016-12-05 DIAGNOSIS — J45909 Unspecified asthma, uncomplicated: Secondary | ICD-10-CM | POA: Diagnosis not present

## 2016-12-05 DIAGNOSIS — I252 Old myocardial infarction: Secondary | ICD-10-CM | POA: Diagnosis not present

## 2016-12-05 DIAGNOSIS — E1122 Type 2 diabetes mellitus with diabetic chronic kidney disease: Secondary | ICD-10-CM | POA: Diagnosis not present

## 2016-12-05 DIAGNOSIS — K219 Gastro-esophageal reflux disease without esophagitis: Secondary | ICD-10-CM | POA: Diagnosis not present

## 2016-12-05 DIAGNOSIS — R6881 Early satiety: Secondary | ICD-10-CM | POA: Diagnosis not present

## 2016-12-05 DIAGNOSIS — I2510 Atherosclerotic heart disease of native coronary artery without angina pectoris: Secondary | ICD-10-CM | POA: Diagnosis not present

## 2016-12-05 DIAGNOSIS — Z955 Presence of coronary angioplasty implant and graft: Secondary | ICD-10-CM | POA: Diagnosis not present

## 2016-12-05 DIAGNOSIS — R140 Abdominal distension (gaseous): Secondary | ICD-10-CM | POA: Diagnosis not present

## 2016-12-05 DIAGNOSIS — E785 Hyperlipidemia, unspecified: Secondary | ICD-10-CM | POA: Diagnosis not present

## 2016-12-05 DIAGNOSIS — N949 Unspecified condition associated with female genital organs and menstrual cycle: Secondary | ICD-10-CM | POA: Diagnosis not present

## 2016-12-05 DIAGNOSIS — G4733 Obstructive sleep apnea (adult) (pediatric): Secondary | ICD-10-CM | POA: Diagnosis not present

## 2016-12-09 DIAGNOSIS — N184 Chronic kidney disease, stage 4 (severe): Secondary | ICD-10-CM | POA: Diagnosis not present

## 2016-12-09 DIAGNOSIS — I701 Atherosclerosis of renal artery: Secondary | ICD-10-CM | POA: Diagnosis not present

## 2016-12-09 DIAGNOSIS — I129 Hypertensive chronic kidney disease with stage 1 through stage 4 chronic kidney disease, or unspecified chronic kidney disease: Secondary | ICD-10-CM | POA: Diagnosis not present

## 2016-12-09 DIAGNOSIS — D631 Anemia in chronic kidney disease: Secondary | ICD-10-CM | POA: Diagnosis not present

## 2016-12-09 DIAGNOSIS — E889 Metabolic disorder, unspecified: Secondary | ICD-10-CM | POA: Diagnosis not present

## 2016-12-09 DIAGNOSIS — E559 Vitamin D deficiency, unspecified: Secondary | ICD-10-CM | POA: Diagnosis not present

## 2016-12-09 DIAGNOSIS — M9080 Osteopathy in diseases classified elsewhere, unspecified site: Secondary | ICD-10-CM | POA: Diagnosis not present

## 2016-12-17 DIAGNOSIS — I252 Old myocardial infarction: Secondary | ICD-10-CM | POA: Diagnosis not present

## 2016-12-17 DIAGNOSIS — I10 Essential (primary) hypertension: Secondary | ICD-10-CM | POA: Diagnosis not present

## 2016-12-17 DIAGNOSIS — I2510 Atherosclerotic heart disease of native coronary artery without angina pectoris: Secondary | ICD-10-CM | POA: Diagnosis not present

## 2016-12-17 DIAGNOSIS — E785 Hyperlipidemia, unspecified: Secondary | ICD-10-CM | POA: Diagnosis not present

## 2016-12-17 DIAGNOSIS — Z01810 Encounter for preprocedural cardiovascular examination: Secondary | ICD-10-CM | POA: Diagnosis not present

## 2016-12-17 DIAGNOSIS — N949 Unspecified condition associated with female genital organs and menstrual cycle: Secondary | ICD-10-CM | POA: Diagnosis not present

## 2016-12-19 DIAGNOSIS — I2510 Atherosclerotic heart disease of native coronary artery without angina pectoris: Secondary | ICD-10-CM | POA: Diagnosis not present

## 2016-12-19 DIAGNOSIS — R1900 Intra-abdominal and pelvic swelling, mass and lump, unspecified site: Secondary | ICD-10-CM | POA: Diagnosis not present

## 2016-12-19 DIAGNOSIS — Z01818 Encounter for other preprocedural examination: Secondary | ICD-10-CM | POA: Diagnosis not present

## 2016-12-19 DIAGNOSIS — K219 Gastro-esophageal reflux disease without esophagitis: Secondary | ICD-10-CM | POA: Diagnosis not present

## 2016-12-19 DIAGNOSIS — N189 Chronic kidney disease, unspecified: Secondary | ICD-10-CM | POA: Diagnosis not present

## 2016-12-19 DIAGNOSIS — J45909 Unspecified asthma, uncomplicated: Secondary | ICD-10-CM | POA: Diagnosis not present

## 2016-12-19 DIAGNOSIS — I10 Essential (primary) hypertension: Secondary | ICD-10-CM | POA: Diagnosis not present

## 2016-12-19 DIAGNOSIS — G4733 Obstructive sleep apnea (adult) (pediatric): Secondary | ICD-10-CM | POA: Diagnosis not present

## 2016-12-19 DIAGNOSIS — E118 Type 2 diabetes mellitus with unspecified complications: Secondary | ICD-10-CM | POA: Diagnosis not present

## 2016-12-20 DIAGNOSIS — R1900 Intra-abdominal and pelvic swelling, mass and lump, unspecified site: Secondary | ICD-10-CM | POA: Insufficient documentation

## 2016-12-20 HISTORY — DX: Intra-abdominal and pelvic swelling, mass and lump, unspecified site: R19.00

## 2016-12-24 HISTORY — PX: TUMOR REMOVAL: SHX12

## 2017-01-07 DIAGNOSIS — E1122 Type 2 diabetes mellitus with diabetic chronic kidney disease: Secondary | ICD-10-CM | POA: Diagnosis not present

## 2017-01-07 DIAGNOSIS — K219 Gastro-esophageal reflux disease without esophagitis: Secondary | ICD-10-CM | POA: Diagnosis not present

## 2017-01-07 DIAGNOSIS — Z955 Presence of coronary angioplasty implant and graft: Secondary | ICD-10-CM | POA: Diagnosis not present

## 2017-01-07 DIAGNOSIS — E785 Hyperlipidemia, unspecified: Secondary | ICD-10-CM | POA: Diagnosis not present

## 2017-01-07 DIAGNOSIS — J811 Chronic pulmonary edema: Secondary | ICD-10-CM | POA: Diagnosis not present

## 2017-01-07 DIAGNOSIS — N184 Chronic kidney disease, stage 4 (severe): Secondary | ICD-10-CM | POA: Diagnosis not present

## 2017-01-07 DIAGNOSIS — R1900 Intra-abdominal and pelvic swelling, mass and lump, unspecified site: Secondary | ICD-10-CM | POA: Diagnosis not present

## 2017-01-07 DIAGNOSIS — G4733 Obstructive sleep apnea (adult) (pediatric): Secondary | ICD-10-CM | POA: Diagnosis not present

## 2017-01-07 DIAGNOSIS — D270 Benign neoplasm of right ovary: Secondary | ICD-10-CM | POA: Diagnosis not present

## 2017-01-07 DIAGNOSIS — Z7951 Long term (current) use of inhaled steroids: Secondary | ICD-10-CM | POA: Diagnosis not present

## 2017-01-07 DIAGNOSIS — Z7982 Long term (current) use of aspirin: Secondary | ICD-10-CM | POA: Diagnosis not present

## 2017-01-07 DIAGNOSIS — R1904 Left lower quadrant abdominal swelling, mass and lump: Secondary | ICD-10-CM | POA: Diagnosis not present

## 2017-01-07 DIAGNOSIS — J9811 Atelectasis: Secondary | ICD-10-CM | POA: Diagnosis not present

## 2017-01-07 DIAGNOSIS — Z794 Long term (current) use of insulin: Secondary | ICD-10-CM | POA: Diagnosis not present

## 2017-01-07 DIAGNOSIS — R0902 Hypoxemia: Secondary | ICD-10-CM | POA: Diagnosis not present

## 2017-01-07 DIAGNOSIS — D271 Benign neoplasm of left ovary: Secondary | ICD-10-CM | POA: Diagnosis not present

## 2017-01-07 DIAGNOSIS — Z801 Family history of malignant neoplasm of trachea, bronchus and lung: Secondary | ICD-10-CM | POA: Diagnosis not present

## 2017-01-07 DIAGNOSIS — Z79899 Other long term (current) drug therapy: Secondary | ICD-10-CM | POA: Diagnosis not present

## 2017-01-07 DIAGNOSIS — I252 Old myocardial infarction: Secondary | ICD-10-CM | POA: Diagnosis not present

## 2017-01-07 DIAGNOSIS — J45909 Unspecified asthma, uncomplicated: Secondary | ICD-10-CM | POA: Diagnosis not present

## 2017-01-07 DIAGNOSIS — N838 Other noninflammatory disorders of ovary, fallopian tube and broad ligament: Secondary | ICD-10-CM | POA: Diagnosis not present

## 2017-01-07 DIAGNOSIS — I129 Hypertensive chronic kidney disease with stage 1 through stage 4 chronic kidney disease, or unspecified chronic kidney disease: Secondary | ICD-10-CM | POA: Diagnosis not present

## 2017-01-07 DIAGNOSIS — I2510 Atherosclerotic heart disease of native coronary artery without angina pectoris: Secondary | ICD-10-CM | POA: Diagnosis not present

## 2017-01-09 DIAGNOSIS — R0902 Hypoxemia: Secondary | ICD-10-CM | POA: Diagnosis not present

## 2017-01-09 DIAGNOSIS — R918 Other nonspecific abnormal finding of lung field: Secondary | ICD-10-CM | POA: Diagnosis not present

## 2017-01-09 DIAGNOSIS — J811 Chronic pulmonary edema: Secondary | ICD-10-CM | POA: Diagnosis not present

## 2017-01-09 DIAGNOSIS — R1900 Intra-abdominal and pelvic swelling, mass and lump, unspecified site: Secondary | ICD-10-CM | POA: Diagnosis not present

## 2017-01-09 DIAGNOSIS — R9431 Abnormal electrocardiogram [ECG] [EKG]: Secondary | ICD-10-CM | POA: Diagnosis not present

## 2017-01-10 DIAGNOSIS — E785 Hyperlipidemia, unspecified: Secondary | ICD-10-CM | POA: Diagnosis not present

## 2017-01-10 DIAGNOSIS — R1900 Intra-abdominal and pelvic swelling, mass and lump, unspecified site: Secondary | ICD-10-CM | POA: Diagnosis not present

## 2017-01-10 DIAGNOSIS — I2510 Atherosclerotic heart disease of native coronary artery without angina pectoris: Secondary | ICD-10-CM | POA: Diagnosis not present

## 2017-01-10 DIAGNOSIS — Z955 Presence of coronary angioplasty implant and graft: Secondary | ICD-10-CM | POA: Diagnosis not present

## 2017-01-10 DIAGNOSIS — R5381 Other malaise: Secondary | ICD-10-CM | POA: Diagnosis not present

## 2017-01-10 DIAGNOSIS — I10 Essential (primary) hypertension: Secondary | ICD-10-CM | POA: Diagnosis not present

## 2017-01-14 DIAGNOSIS — E1122 Type 2 diabetes mellitus with diabetic chronic kidney disease: Secondary | ICD-10-CM | POA: Diagnosis not present

## 2017-01-14 DIAGNOSIS — Z7982 Long term (current) use of aspirin: Secondary | ICD-10-CM | POA: Diagnosis not present

## 2017-01-14 DIAGNOSIS — I252 Old myocardial infarction: Secondary | ICD-10-CM | POA: Diagnosis not present

## 2017-01-14 DIAGNOSIS — G4733 Obstructive sleep apnea (adult) (pediatric): Secondary | ICD-10-CM | POA: Diagnosis not present

## 2017-01-14 DIAGNOSIS — Z483 Aftercare following surgery for neoplasm: Secondary | ICD-10-CM | POA: Diagnosis not present

## 2017-01-14 DIAGNOSIS — Z7902 Long term (current) use of antithrombotics/antiplatelets: Secondary | ICD-10-CM | POA: Diagnosis not present

## 2017-01-14 DIAGNOSIS — Z794 Long term (current) use of insulin: Secondary | ICD-10-CM | POA: Diagnosis not present

## 2017-01-14 DIAGNOSIS — I2510 Atherosclerotic heart disease of native coronary artery without angina pectoris: Secondary | ICD-10-CM | POA: Diagnosis not present

## 2017-01-14 DIAGNOSIS — Z90722 Acquired absence of ovaries, bilateral: Secondary | ICD-10-CM | POA: Diagnosis not present

## 2017-01-14 DIAGNOSIS — Z95828 Presence of other vascular implants and grafts: Secondary | ICD-10-CM | POA: Diagnosis not present

## 2017-01-14 DIAGNOSIS — I129 Hypertensive chronic kidney disease with stage 1 through stage 4 chronic kidney disease, or unspecified chronic kidney disease: Secondary | ICD-10-CM | POA: Diagnosis not present

## 2017-01-14 DIAGNOSIS — J45909 Unspecified asthma, uncomplicated: Secondary | ICD-10-CM | POA: Diagnosis not present

## 2017-01-14 DIAGNOSIS — N184 Chronic kidney disease, stage 4 (severe): Secondary | ICD-10-CM | POA: Diagnosis not present

## 2017-01-14 DIAGNOSIS — Z9181 History of falling: Secondary | ICD-10-CM | POA: Diagnosis not present

## 2017-01-21 DIAGNOSIS — Z9889 Other specified postprocedural states: Secondary | ICD-10-CM | POA: Diagnosis not present

## 2017-01-21 DIAGNOSIS — L608 Other nail disorders: Secondary | ICD-10-CM | POA: Diagnosis not present

## 2017-01-21 DIAGNOSIS — E1122 Type 2 diabetes mellitus with diabetic chronic kidney disease: Secondary | ICD-10-CM | POA: Diagnosis not present

## 2017-01-21 DIAGNOSIS — Z6826 Body mass index (BMI) 26.0-26.9, adult: Secondary | ICD-10-CM | POA: Diagnosis not present

## 2017-01-23 DIAGNOSIS — Z483 Aftercare following surgery for neoplasm: Secondary | ICD-10-CM | POA: Diagnosis not present

## 2017-01-27 DIAGNOSIS — I10 Essential (primary) hypertension: Secondary | ICD-10-CM | POA: Diagnosis not present

## 2017-01-27 DIAGNOSIS — I2510 Atherosclerotic heart disease of native coronary artery without angina pectoris: Secondary | ICD-10-CM | POA: Diagnosis not present

## 2017-01-27 DIAGNOSIS — Z955 Presence of coronary angioplasty implant and graft: Secondary | ICD-10-CM | POA: Diagnosis not present

## 2017-01-27 DIAGNOSIS — I252 Old myocardial infarction: Secondary | ICD-10-CM | POA: Diagnosis not present

## 2017-01-29 ENCOUNTER — Ambulatory Visit (INDEPENDENT_AMBULATORY_CARE_PROVIDER_SITE_OTHER): Payer: PPO | Admitting: Ophthalmology

## 2017-01-29 DIAGNOSIS — H43813 Vitreous degeneration, bilateral: Secondary | ICD-10-CM

## 2017-01-29 DIAGNOSIS — H353132 Nonexudative age-related macular degeneration, bilateral, intermediate dry stage: Secondary | ICD-10-CM | POA: Diagnosis not present

## 2017-01-29 DIAGNOSIS — H35033 Hypertensive retinopathy, bilateral: Secondary | ICD-10-CM | POA: Diagnosis not present

## 2017-01-29 DIAGNOSIS — E113293 Type 2 diabetes mellitus with mild nonproliferative diabetic retinopathy without macular edema, bilateral: Secondary | ICD-10-CM | POA: Diagnosis not present

## 2017-01-29 DIAGNOSIS — I10 Essential (primary) hypertension: Secondary | ICD-10-CM | POA: Diagnosis not present

## 2017-01-29 DIAGNOSIS — E11319 Type 2 diabetes mellitus with unspecified diabetic retinopathy without macular edema: Secondary | ICD-10-CM | POA: Diagnosis not present

## 2017-03-07 DIAGNOSIS — N184 Chronic kidney disease, stage 4 (severe): Secondary | ICD-10-CM | POA: Diagnosis not present

## 2017-03-11 DIAGNOSIS — E559 Vitamin D deficiency, unspecified: Secondary | ICD-10-CM | POA: Diagnosis not present

## 2017-03-11 DIAGNOSIS — E889 Metabolic disorder, unspecified: Secondary | ICD-10-CM | POA: Diagnosis not present

## 2017-03-11 DIAGNOSIS — I701 Atherosclerosis of renal artery: Secondary | ICD-10-CM | POA: Diagnosis not present

## 2017-03-11 DIAGNOSIS — I129 Hypertensive chronic kidney disease with stage 1 through stage 4 chronic kidney disease, or unspecified chronic kidney disease: Secondary | ICD-10-CM | POA: Diagnosis not present

## 2017-03-12 DIAGNOSIS — H40013 Open angle with borderline findings, low risk, bilateral: Secondary | ICD-10-CM | POA: Diagnosis not present

## 2017-03-12 DIAGNOSIS — G4733 Obstructive sleep apnea (adult) (pediatric): Secondary | ICD-10-CM | POA: Diagnosis not present

## 2017-03-12 DIAGNOSIS — H04123 Dry eye syndrome of bilateral lacrimal glands: Secondary | ICD-10-CM | POA: Diagnosis not present

## 2017-03-12 DIAGNOSIS — H16223 Keratoconjunctivitis sicca, not specified as Sjogren's, bilateral: Secondary | ICD-10-CM | POA: Diagnosis not present

## 2017-03-12 DIAGNOSIS — H01003 Unspecified blepharitis right eye, unspecified eyelid: Secondary | ICD-10-CM | POA: Diagnosis not present

## 2017-03-14 DIAGNOSIS — E1122 Type 2 diabetes mellitus with diabetic chronic kidney disease: Secondary | ICD-10-CM | POA: Diagnosis not present

## 2017-03-17 DIAGNOSIS — D225 Melanocytic nevi of trunk: Secondary | ICD-10-CM | POA: Diagnosis not present

## 2017-03-17 DIAGNOSIS — L728 Other follicular cysts of the skin and subcutaneous tissue: Secondary | ICD-10-CM | POA: Diagnosis not present

## 2017-03-17 DIAGNOSIS — L821 Other seborrheic keratosis: Secondary | ICD-10-CM | POA: Diagnosis not present

## 2017-03-20 DIAGNOSIS — Z6827 Body mass index (BMI) 27.0-27.9, adult: Secondary | ICD-10-CM | POA: Diagnosis not present

## 2017-03-20 DIAGNOSIS — Z0000 Encounter for general adult medical examination without abnormal findings: Secondary | ICD-10-CM | POA: Diagnosis not present

## 2017-03-20 DIAGNOSIS — E119 Type 2 diabetes mellitus without complications: Secondary | ICD-10-CM | POA: Diagnosis not present

## 2017-03-24 ENCOUNTER — Telehealth: Payer: Self-pay | Admitting: Internal Medicine

## 2017-03-24 NOTE — Telephone Encounter (Signed)
Called and spoke to pt. Appt made with 04/29/2017 with [facility]. Pt verbalized understanding and denied any further questions or concerns at this time.

## 2017-03-24 NOTE — Telephone Encounter (Signed)
Please advise Joellen Jersey of a work in appt for this patient around the 2nd week of Sept.  Pt cannot do Thursday and Friday appts.   Pt states that she got supplies for her CPAP 03/12/17 and she was told that her insurance may not cover this because she does not have an appt on file with our office. Advised that any supplies received up until 04/15/2017 should be covered by insurance.  DME: [facility]   Please advise Dr Joni Reining. Thanks.

## 2017-03-24 NOTE — Telephone Encounter (Signed)
Okay to use RNA slots where available. Thanks.

## 2017-04-17 ENCOUNTER — Encounter: Payer: Self-pay | Admitting: Allergy and Immunology

## 2017-04-17 ENCOUNTER — Ambulatory Visit (INDEPENDENT_AMBULATORY_CARE_PROVIDER_SITE_OTHER): Payer: PPO | Admitting: Allergy and Immunology

## 2017-04-17 VITALS — BP 180/70 | HR 96 | Temp 99.6°F | Resp 24 | Ht 65.95 in | Wt 177.8 lb

## 2017-04-17 DIAGNOSIS — E119 Type 2 diabetes mellitus without complications: Secondary | ICD-10-CM

## 2017-04-17 DIAGNOSIS — J4551 Severe persistent asthma with (acute) exacerbation: Secondary | ICD-10-CM | POA: Diagnosis not present

## 2017-04-17 DIAGNOSIS — Z794 Long term (current) use of insulin: Secondary | ICD-10-CM

## 2017-04-17 MED ORDER — ALBUTEROL SULFATE HFA 108 (90 BASE) MCG/ACT IN AERS: INHALATION_SPRAY | RESPIRATORY_TRACT | 1 refills | Status: DC

## 2017-04-17 MED ORDER — METHYLPREDNISOLONE ACETATE 80 MG/ML IJ SUSP
80.0000 mg | Freq: Once | INTRAMUSCULAR | Status: AC
  Administered 2017-04-17: 80 mg via INTRAMUSCULAR

## 2017-04-17 MED ORDER — MONTELUKAST SODIUM 10 MG PO TABS: 10.0000 mg | ORAL_TABLET | Freq: Every day | ORAL | 5 refills | Status: AC

## 2017-04-17 NOTE — Progress Notes (Signed)
Follow-up Note  Referring Provider: [name], MD Primary Provider: [name], MD Date of Office Visit: 04/17/2017  Subjective:   Leah Walsh (DOB: Jan 21, 1941) is a 76 y.o. female who returns to the [facility] [place] on 04/17/2017 in re-evaluation of the following:  HPI: Briar presents to this clinic in evaluation of breathing problems that have developed over the course of the past week. I have not seen her in this clinic in over 2 years.  She states that over the past week she has developed coughing and wheezing and shortness of breath occurring both daytime and nighttime along with nasal congestion and sneezing and feeling as though she is "freezing". There is no chest pain or sputum production or ugly nasal discharge or headaches or obvious fever or leg swelling. She has had no significant environmental change that may act as a trigger and she is not on any new medications recently.  She has had some problems postoperatively since 01/08/2017 when she had a total hysterectomy performed. Apparently she had evaluation for significant hypoxemia during her surgery. It was suggested that she have a CT scan in evaluation of pulmonary embolus during her hospitalization but she refused to have this done based upon her very severe kidney dysfunction and the risk of exposure to contrast dye. She was placed back on her Brilinta after surgery was completed so she is presently anticoagulated. Apparently after a week her oxygenation came back to normal and as far as she knows she has had good oxygenation since that event.  I had seen her over two years ago for a history of asthma but apparently this has been under very good control without the use of any controller agents and she rarely if ever uses a short acting bronchodilator and in fact she is out of all medications and does not possess a short acting bronchodilator and has not done so for the past year. She also has sleep apnea and  is treated with CPAP. She has a history of reflux that is under good control currently.  Allergies as of 04/17/2017      Reactions   Compazine [prochlorperazine Edisylate] Other (See Comments)   Stroke like symptoms   Colcrys [colchicine] Diarrhea   Hydrocodone    Codeine Nausea And Vomiting      Medication List      allopurinol 100 MG tablet Commonly known as:  ZYLOPRIM Take 100 mg by mouth daily.   amLODipine 5 MG tablet Commonly known as:  NORVASC Take 5 mg by mouth 2 (two) times daily.   aspirin 81 MG tablet Take 81 mg by mouth daily.   atorvastatin 80 MG tablet Commonly known as:  LIPITOR Take 80 mg by mouth at bedtime.   B-D UF III MINI PEN NEEDLES 31G X 5 MM Misc Generic drug:  Insulin Pen Needle   BRILINTA 90 MG Tabs tablet Generic drug:  ticagrelor Take 60 mg by mouth 2 (two) times daily.   carvedilol 12.5 MG tablet Commonly known as:  COREG Take 12.5 mg by mouth 3 (three) times daily.   CoQ10 200 MG Caps Take 1 capsule by mouth daily.   docusate sodium 100 MG capsule Commonly known as:  COLACE TAKE 1 CAPSULE (100 MG TOTAL) BY MOUTH TWO (2) TIMES A DAY.   FISH OIL PO Take by mouth daily.   furosemide 40 MG tablet Commonly known as:  LASIX   hydrALAZINE 50 MG tablet Commonly known as:  APRESOLINE  Take 50 mg by mouth 4 (four) times daily.   LANTUS SOLOSTAR 100 UNIT/ML Solostar Pen Generic drug:  Insulin Glargine 40 Units 2 (two) times daily.   losartan 50 MG tablet Commonly known as:  COZAAR Take 50 mg by mouth daily.   MELATONIN PO Take by mouth at bedtime.   montelukast 10 MG tablet Commonly known as:  SINGULAIR TAKE 1 TABLET BY MOUTH EVERY DAY   multivitamin tablet Take 1 tablet by mouth daily.   NITROSTAT 0.4 MG SL tablet Generic drug:  nitroGLYCERIN Place 0.4 mg under the tongue every 5 (five) minutes as needed.   NOVOLOG FLEXPEN 100 UNIT/ML FlexPen Generic drug:  insulin aspart   omeprazole 40 MG capsule Commonly known as:   PRILOSEC Take 40 mg by mouth daily.   PRESERVISION AREDS 2 PO Take by mouth.   Vitamin D 2000 units Caps Take 1 capsule by mouth daily.       Past Medical History:  Diagnosis Date  . Asthma   . Diabetes ([place])   . Heart attack ([place]) 2014  . Hyperlipidemia   . Hypertension   . Kidney disease   . Sleep apnea    Using CPAP    Past Surgical History:  Procedure Laterality Date  . ABDOMINAL HYSTERECTOMY    . CAROTID STENT    . CHOLECYSTECTOMY    . TUBAL LIGATION    . 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 VESICOVAGINAL FISTULA CLOSURE W/ TAH      Review of systems negative except as noted in HPI / PMHx or noted below:  Review of Systems  Constitutional: Negative.   HENT: Negative.   Eyes: Negative.   Respiratory: Negative.   Cardiovascular: Negative.   Gastrointestinal: Negative.   Genitourinary: Negative.   Musculoskeletal: Negative.   Skin: Negative.   Neurological: Negative.   Endo/Heme/Allergies: Negative.   Psychiatric/Behavioral: Negative.      Objective:   Vitals:   04/17/17 1122  BP: (!) 180/70  Pulse: 96  Resp: (!) 24  Temp: 99.6 F (37.6 C)  SpO2: 92%   Height: 5' 5.95" (167.5 cm)  Weight: 177 lb 12.8 oz (80.6 kg)   Physical Exam  Constitutional: She is well-developed, well-nourished, and in no distress.  HENT:  Head: Normocephalic.  Right Ear: Tympanic membrane, external ear and ear canal normal.  Left Ear: Tympanic membrane, external ear and ear canal normal.  Nose: Nose normal. No mucosal edema or rhinorrhea.  Mouth/Throat: Uvula is midline, oropharynx is clear and moist and mucous membranes are normal. No oropharyngeal exudate.  Eyes: Conjunctivae are normal.  Neck: Trachea normal. No tracheal tenderness present. No tracheal deviation present. No thyromegaly present.  Cardiovascular: Normal rate, regular rhythm, S1 normal, S2 normal and normal heart sounds.   No murmur heard. Pulmonary/Chest: Breath sounds normal. No stridor.  No respiratory distress. She has no wheezes. She has no rales.  Musculoskeletal: She exhibits no edema.  Lymphadenopathy:       Head (right side): No tonsillar adenopathy present.       Head (left side): No tonsillar adenopathy present.    She has no cervical adenopathy.  Neurological: She is alert. Gait normal.  Skin: No rash noted. She is not diaphoretic. No erythema. Nails show no clubbing.  Psychiatric: Mood and affect normal.    Diagnostics: Oxygen saturation was 92% on room air at rest  Results of blood tests obtained 03/07/2017 identified a hemoglobin of 9.9, white blood cell count 7.7  with an absolute eosinophil count of 300 and absolute lymphocyte count of 2200 and a platelet count of 227   Spirometry was performed and demonstrated an FEV1 of 1.25 at 54 % of predicted. Following the administration of nebulized ipratropium and levo albuterol her FEV1 rose to 1.57 which was an increase in the FEV1 of 26%.  The patient had an Asthma Control Test with the following results: ACT Total Score: 15.    Assessment and Plan:   1. Asthma, not well controlled, severe persistent, with acute exacerbation   2. Type 2 diabetes mellitus without complication, with long-term current use of insulin ([place])     1. Nebulized ipratropium and levo albuterol delivered in clinic today  2. Depo-Medrol 80 IM delivered in clinic today. Check blood sugars  3. QVAR 80 REDIHALER 2 inhalations two times per day WITHOUT spacer  4. Montelukast 10mg  one tablet one time per day  5. If Needed: Proventil HFA 2 puffs every 4-6 hours if needed  6. Return to clinic Tuesday or earlier if problem  I will make the assumption that Twylah developed a viral respiratory tract infection affecting both her upper and lower airways with exacerbation of her asthma and treat her with anti-inflammatory agents for her respiratory tract as noted above. If she does not improve in the face of this therapy then we need to consider  other etiologic factors contributing to her respiratory tract symptoms including possible heart dysfunction. With all her issues I would rather hold off on administering a long-acting bronchodilator at this point and we will see we can get things under control just with an inhaled steroid. She will need to check her blood sugars as we are giving her a systemic steroid which may cause some degree of hyperglycemia. She will return to this clinic next week or earlier if there is a problem.  Allena Katz, MD Allergy / Immunology [place]

## 2017-04-17 NOTE — Patient Instructions (Addendum)
  1. Nebulized ipratropium and levo albuterol delivered in clinic today  2. Depo-Medrol 80 IM delivered in clinic today. Check blood sugars  3. QVAR 80 REDIHALER 2 inhalations two times per day WITHOUT spacer  4. Montelukast 10mg  one tablet one time per day  5. If Needed: Proventil HFA 2 puffs every 4-6 hours if needed  6. Return to clinic Tuesday or earlier if problem

## 2017-04-22 ENCOUNTER — Ambulatory Visit: Payer: PPO | Admitting: Allergy

## 2017-04-23 ENCOUNTER — Encounter: Payer: Self-pay | Admitting: Allergy

## 2017-04-23 ENCOUNTER — Ambulatory Visit (INDEPENDENT_AMBULATORY_CARE_PROVIDER_SITE_OTHER): Payer: PPO | Admitting: Allergy

## 2017-04-23 VITALS — BP 132/62 | HR 70 | Resp 18

## 2017-04-23 DIAGNOSIS — J4550 Severe persistent asthma, uncomplicated: Secondary | ICD-10-CM

## 2017-04-23 DIAGNOSIS — E119 Type 2 diabetes mellitus without complications: Secondary | ICD-10-CM

## 2017-04-23 DIAGNOSIS — R0981 Nasal congestion: Secondary | ICD-10-CM

## 2017-04-23 DIAGNOSIS — Z794 Long term (current) use of insulin: Secondary | ICD-10-CM

## 2017-04-23 NOTE — Patient Instructions (Addendum)
  Doing much better!   1.  Continue QVAR 80 REDIHALER 2 inhalations two times per day WITHOUT spacer  2. Continue Montelukast 10mg  one tablet one time per day  3. If Needed: Proventil HFA 2 puffs every 4-6 hours if needed  4. Use Dymista 2 sprays each nostril up to twice a day  5. Return to clinic in 4 months or sooner if needed

## 2017-04-23 NOTE — Progress Notes (Signed)
Follow-up Note  RE: Leah Walsh MRN: [id number] DOB: 04/07/1941 Date of Office Visit: 04/23/2017   History of present illness: Leah Walsh is a 76 y.o. female presenting today for follow-up of asthma exacerbation. She was last in the office on 04/17/2017 by Dr. Neldon Mc. He recommended that she return to the office in a week to make sure she had improved.  At her last visit she was provided with albuterol treatment in the office as well as a Depo-Medrol injection. She was advised to use Qvar redihaler 80  2 puffs twice a day and Singulair daily.  She states she is much improved now. Her cough and wheeze is much improved.  She is only needed to use albuterol twice in the past week. She denies any nighttime awakenings.  She has been monitoring her blood sugars and they have been okay.  She does state she has been having more of a stuffy nose. She has used Dymista in the past and she noted that it worked well and requests if we could provide her with some samples today.  Review of systems: Review of Systems  Constitutional: Negative for chills, fever and malaise/fatigue.  HENT: Positive for congestion. Negative for ear discharge, ear pain, nosebleeds, sinus pain, sore throat and tinnitus.   Eyes: Negative for pain, discharge and redness.  Respiratory: Positive for cough and wheezing. Negative for hemoptysis, sputum production and shortness of breath.   Cardiovascular: Negative for chest pain.  Gastrointestinal: Negative for abdominal pain, constipation, diarrhea, nausea and vomiting.  Skin: Negative for itching and rash.  Neurological: Negative for headaches.    All other systems negative unless noted above in HPI  Past medical/social/surgical/family history have been reviewed and are unchanged unless specifically indicated below.  No changes  Medication List: Allergies as of 04/23/2017      Reactions   Compazine [prochlorperazine Edisylate] Other (See Comments)   Stroke like  symptoms   Colcrys [colchicine] Diarrhea   Hydrocodone    Codeine Nausea And Vomiting      Medication List       Accurate as of 04/23/17  5:28 PM. Always use your most recent med list.          albuterol 108 (90 Base) MCG/ACT inhaler Commonly known as:  PROVENTIL HFA Inhale two puffs every four to six hours as needed for cough or wheeze   allopurinol 100 MG tablet Commonly known as:  ZYLOPRIM Take 100 mg by mouth daily.   amLODipine 5 MG tablet Commonly known as:  NORVASC Take 5 mg by mouth 2 (two) times daily.   aspirin 81 MG tablet Take 81 mg by mouth daily.   atorvastatin 80 MG tablet Commonly known as:  LIPITOR Take 80 mg by mouth at bedtime.   B-D UF III MINI PEN NEEDLES 31G X 5 MM Misc Generic drug:  Insulin Pen Needle   BRILINTA 90 MG Tabs tablet Generic drug:  ticagrelor Take 60 mg by mouth 2 (two) times daily.   carvedilol 12.5 MG tablet Commonly known as:  COREG Take 12.5 mg by mouth 3 (three) times daily.   CoQ10 200 MG Caps Take 1 capsule by mouth daily.   docusate sodium 100 MG capsule Commonly known as:  COLACE TAKE 1 CAPSULE (100 MG TOTAL) BY MOUTH TWO (2) TIMES A DAY.   FISH OIL PO Take by mouth daily.   furosemide 40 MG tablet Commonly known as:  LASIX   hydrALAZINE 50 MG tablet Commonly known as:  APRESOLINE Take 50 mg by mouth 4 (four) times daily.   LANTUS SOLOSTAR 100 UNIT/ML Solostar Pen Generic drug:  Insulin Glargine 40 Units 2 (two) times daily.   losartan 50 MG tablet Commonly known as:  COZAAR Take 50 mg by mouth daily.   MELATONIN PO Take by mouth at bedtime.   montelukast 10 MG tablet Commonly known as:  SINGULAIR Take 1 tablet (10 mg total) by mouth daily.   multivitamin tablet Take 1 tablet by mouth daily.   NITROSTAT 0.4 MG SL tablet Generic drug:  nitroGLYCERIN Place 0.4 mg under the tongue every 5 (five) minutes as needed.   NOVOLOG FLEXPEN 100 UNIT/ML FlexPen Generic drug:  insulin aspart     NUTRITIONAL SUPPLEMENT PO Take by mouth. OTC supplement to help with cholesterol. Patient unsure of name.   omeprazole 40 MG capsule Commonly known as:  PRILOSEC Take 40 mg by mouth daily.   PRESERVISION AREDS 2 PO Take by mouth.   QVAR REDIHALER 80 MCG/ACT inhaler Generic drug:  beclomethasone Inhale 2 puffs into the lungs 2 (two) times daily.   Vitamin D 2000 units Caps Take 1 capsule by mouth daily.       Known medication allergies: Allergies  Allergen Reactions  . Compazine [Prochlorperazine Edisylate] Other (See Comments)    Stroke like symptoms  . Colcrys [Colchicine] Diarrhea  . Hydrocodone   . Codeine Nausea And Vomiting     Physical examination: Blood pressure 132/62, pulse 70, resp. rate 18, SpO2 98 %.  General: Alert, interactive, in no acute distress. HEENT: PERRLA, TMs pearly gray, turbinates moderately edematous without discharge, post-pharynx non erythematous. Neck: Supple without lymphadenopathy. Lungs: Clear to auscultation without wheezing, rhonchi or rales. {increased work of breathing. CV: Normal S1, S2 without murmurs. Abdomen: Nondistended, nontender. Skin: Warm and dry, without lesions or rashes. Extremities:  No clubbing, cyanosis or edema. Neuro:   Grossly intact.  Diagnositics/Labs:  Spirometry: FEV1: 1.72L  74%, FVC: 2.15L  70%.Study is much improved from last week's     Assessment and plan:   Severe persistent asthma      - she is recovering from an exacerbation likely triggered by viral illness. She is much better this week.     -  Continue QVAR 80 REDIHALER 2 inhalations two times per day WITHOUT spacer   -  Continue Montelukast 10mg  one tablet one time per day   - If Needed: Proventil HFA 2 puffs every 4-6 hours if needed  Nasal congestion    - we unfortunately do not have any Dymista samples today but we are able to acquire some and she will come by the office tomorrow and pick some up.  Advised to use 2 sprays each nostril up  to twice a day   Type II DM    - advised she continue to take her blood sugars routinely as depo-Medrol injection can have lasting effects for weeks     Return to clinic in 4 months or sooner if needed   I appreciate the opportunity to take part in S[place]oney's care. Please do not hesitate to contact me with questions.  Sincerely,   Prudy Feeler, MD Allergy/Immunology [facility]

## 2017-04-28 ENCOUNTER — Encounter: Payer: Self-pay | Admitting: Internal Medicine

## 2017-04-29 ENCOUNTER — Encounter: Payer: Self-pay | Admitting: Internal Medicine

## 2017-04-29 ENCOUNTER — Ambulatory Visit (INDEPENDENT_AMBULATORY_CARE_PROVIDER_SITE_OTHER)
Admission: RE | Admit: 2017-04-29 | Discharge: 2017-04-29 | Disposition: A | Discharge status: Home / Self Care | Payer: PPO | Source: Ambulatory Visit | Attending: Internal Medicine | Admitting: Internal Medicine

## 2017-04-29 ENCOUNTER — Ambulatory Visit (INDEPENDENT_AMBULATORY_CARE_PROVIDER_SITE_OTHER): Payer: PPO | Admitting: Internal Medicine

## 2017-04-29 VITALS — BP 130/72 | HR 62 | Ht 68.0 in | Wt 174.8 lb

## 2017-04-29 DIAGNOSIS — G4733 Obstructive sleep apnea (adult) (pediatric): Secondary | ICD-10-CM | POA: Diagnosis not present

## 2017-04-29 DIAGNOSIS — R0609 Other forms of dyspnea: Secondary | ICD-10-CM | POA: Diagnosis not present

## 2017-04-29 DIAGNOSIS — J4540 Moderate persistent asthma, uncomplicated: Secondary | ICD-10-CM

## 2017-04-29 DIAGNOSIS — Z23 Encounter for immunization: Secondary | ICD-10-CM | POA: Diagnosis not present

## 2017-04-29 DIAGNOSIS — J45909 Unspecified asthma, uncomplicated: Secondary | ICD-10-CM | POA: Diagnosis not present

## 2017-04-29 NOTE — Assessment & Plan Note (Signed)
After interruption while hospitalized for surgery, she is now using CPAP again full-time and benefits.

## 2017-04-29 NOTE — Progress Notes (Signed)
HPI female never smoker followed for OSA, complicated by CAD/MI/stent, asthma (Dr. Neldon Mc), DM 2 NPSG 12/05/10([place]) AHI 14.2/ hr, weight 178 lbs.  PFT 11/07/14-  Moderate Diffusion deficit 60%. Normal flows and lung volumes with insignificant response to bronchodilator. a1AT- 10/07/14-  Nl MM 123 OK  --------------------------------------------------------------------------------------------  04/15/2016-76 year old female never smoker followed for OSA, complicated by CAD/MI/stent, asthma (Dr. Neldon Mc), DM 2 LOV- NP- 7/17- change to [facility] for insurance. CPAP auto 5-15 /[facility] FOLLOWS FOR: DME: [facility] in [place], Pt wears CPAP nightly and DL attached. Pt will need order for new supplies sent to [facility].  Needs replacement supplies. Mask was too big and blowing air in her eyes.. Admits sleeping much better with CPAP. Download indicates 63% 4 hour compliance, AHI 3.8. Wore heart monitor for tachycardia with history of MI/stent  04/29/17- 75 year old female never smoker followed for OSA, complicated by CAD/MI/stent, asthma (Dr. Neldon Mc), DM 2 CPAP auto 5-15/[facility] FOLLOWS FOR: DME: [facility]. Pt wears CPAP nightly and doing well with new mask that goes under the nose. DL attached.  Pt will need order for new supplies now. GYN surgery for benign tumor in May at [facility]. By report had some atelectasis postop. She declined a CT scan with contrast because she didn't want to further worsening kidney function as a diabetic. She was off CPAP during that time but has resumed every night now. Download reflects the break in therapy while she was hospitalized.  ROS see HPI    + = positive Constitutional:   No-   weight loss, night sweats, fevers, chills, fatigue, lassitude. HEENT:   No-  headaches, difficulty swallowing, tooth/dental problems, sore throat,       No-  sneezing, itching, ear ache, + nasal congestion, post nasal drip,  CV:  No-   chest pain, orthopnea, PND, + swelling in lower extremities, anasarca,                                               dizziness, + palpitations Resp: No-   shortness of breath with exertion or at rest.              No-   productive cough,  No non-productive cough,  No- coughing up of blood.              No-   change in color of mucus.  No- wheezing.   Skin: No-   rash or lesions. GI:  No-   heartburn, + indigestion, abdominal pain, nausea, vomiting,  GU:  MS:  + joint pain or swelling.   Neuro-     nothing unusual Psych:  No- change in mood or affect. No depression or anxiety.  No memory loss.  OBJ- Physical Exam General- Alert, Oriented, Affect-appropriate, Distress- none acute Skin- rash-none, lesions- none, excoriation- none Lymphadenopathy- none Head- atraumatic            Eyes- Gross vision intact, PERRLA, conjunctivae and secretions clear            Ears- Hearing, canals-normal            Nose- Clear, no-Septal dev, mucus, polyps, erosion, perforation             Throat- Mallampati III , mucosa clear , drainage- none, tonsils- atrophic Neck- flexible , trachea midline, no stridor , thyroid nl, carotid no bruit Chest - symmetrical excursion ,  unlabored           Heart/CV- RRR , no murmur , no gallop  , no rub, nl s1 s2                           - JVD- none , edema- none, stasis changes- none, varices- none           Lung- clear to P&A, wheeze- none, cough- none , dullness-none, rub- none           Chest wall-  Abd-  Br/ Gen/ Rectal- Not done, not indicated Extrem- cyanosis- none, clubbing, none, atrophy- none, strength- nl. Neuro- grossly intact to observation

## 2017-04-29 NOTE — Patient Instructions (Signed)
We can continue CPAP auto 5-15, mask of choice, humidifier, supplies, AirView   Dx OSA  Order- CXR dx asthma moderate persistent,   Flu vax hi dose  Please call if we can help

## 2017-04-29 NOTE — Assessment & Plan Note (Signed)
She reports atelectasis after GYN surgery. Now improved. Plan-CXR, flu shot

## 2017-05-02 ENCOUNTER — Telehealth: Payer: Self-pay | Admitting: Internal Medicine

## 2017-05-02 NOTE — Telephone Encounter (Signed)
Notes recorded by Deneise Lever, MD on 04/29/2017 at 3:51 PM EDT CXR- lungs are now clear and fully expanded. Minor old scarring. No new process.  LMTCB

## 2017-05-05 NOTE — Telephone Encounter (Signed)
Spoke with pt. She is aware of results. Nothing further was needed.  

## 2017-05-08 ENCOUNTER — Telehealth: Payer: Self-pay | Admitting: Internal Medicine

## 2017-05-08 NOTE — Telephone Encounter (Signed)
Notes recorded by Deneise Lever, MD on 04/29/2017 at 3:51 PM EDT CXR- lungs are now clear and fully expanded. Minor old scarring. No new process.       Left a detailed message letting pt know chest xray results as requested. Nothing further is needed.

## 2017-06-09 DIAGNOSIS — N184 Chronic kidney disease, stage 4 (severe): Secondary | ICD-10-CM | POA: Diagnosis not present

## 2017-06-09 DIAGNOSIS — I129 Hypertensive chronic kidney disease with stage 1 through stage 4 chronic kidney disease, or unspecified chronic kidney disease: Secondary | ICD-10-CM | POA: Diagnosis not present

## 2017-06-09 DIAGNOSIS — M9080 Osteopathy in diseases classified elsewhere, unspecified site: Secondary | ICD-10-CM | POA: Diagnosis not present

## 2017-06-09 DIAGNOSIS — E559 Vitamin D deficiency, unspecified: Secondary | ICD-10-CM | POA: Diagnosis not present

## 2017-06-09 DIAGNOSIS — E889 Metabolic disorder, unspecified: Secondary | ICD-10-CM | POA: Diagnosis not present

## 2017-06-11 DIAGNOSIS — E889 Metabolic disorder, unspecified: Secondary | ICD-10-CM | POA: Diagnosis not present

## 2017-06-11 DIAGNOSIS — I129 Hypertensive chronic kidney disease with stage 1 through stage 4 chronic kidney disease, or unspecified chronic kidney disease: Secondary | ICD-10-CM | POA: Diagnosis not present

## 2017-06-11 DIAGNOSIS — I701 Atherosclerosis of renal artery: Secondary | ICD-10-CM | POA: Diagnosis not present

## 2017-06-11 DIAGNOSIS — N184 Chronic kidney disease, stage 4 (severe): Secondary | ICD-10-CM | POA: Diagnosis not present

## 2017-07-03 DIAGNOSIS — I2510 Atherosclerotic heart disease of native coronary artery without angina pectoris: Secondary | ICD-10-CM | POA: Insufficient documentation

## 2017-07-03 DIAGNOSIS — I252 Old myocardial infarction: Secondary | ICD-10-CM | POA: Diagnosis not present

## 2017-07-03 DIAGNOSIS — I701 Atherosclerosis of renal artery: Secondary | ICD-10-CM | POA: Diagnosis not present

## 2017-07-03 HISTORY — DX: Atherosclerotic heart disease of native coronary artery without angina pectoris: I25.10

## 2017-07-24 DIAGNOSIS — H35033 Hypertensive retinopathy, bilateral: Secondary | ICD-10-CM | POA: Diagnosis not present

## 2017-07-24 DIAGNOSIS — H353132 Nonexudative age-related macular degeneration, bilateral, intermediate dry stage: Secondary | ICD-10-CM | POA: Diagnosis not present

## 2017-07-24 DIAGNOSIS — E113393 Type 2 diabetes mellitus with moderate nonproliferative diabetic retinopathy without macular edema, bilateral: Secondary | ICD-10-CM | POA: Diagnosis not present

## 2017-07-24 DIAGNOSIS — H40013 Open angle with borderline findings, low risk, bilateral: Secondary | ICD-10-CM | POA: Diagnosis not present

## 2017-07-28 DIAGNOSIS — M25531 Pain in right wrist: Secondary | ICD-10-CM | POA: Diagnosis not present

## 2017-07-28 DIAGNOSIS — W19XXXA Unspecified fall, initial encounter: Secondary | ICD-10-CM | POA: Diagnosis not present

## 2017-07-28 DIAGNOSIS — Z6828 Body mass index (BMI) 28.0-28.9, adult: Secondary | ICD-10-CM | POA: Diagnosis not present

## 2017-07-28 DIAGNOSIS — Y92009 Unspecified place in unspecified non-institutional (private) residence as the place of occurrence of the external cause: Secondary | ICD-10-CM | POA: Diagnosis not present

## 2017-07-28 DIAGNOSIS — M25562 Pain in left knee: Secondary | ICD-10-CM | POA: Diagnosis not present

## 2017-08-21 ENCOUNTER — Ambulatory Visit: Payer: PPO | Admitting: Allergy and Immunology

## 2017-09-08 DIAGNOSIS — M25551 Pain in right hip: Secondary | ICD-10-CM | POA: Diagnosis not present

## 2017-09-08 DIAGNOSIS — S8991XA Unspecified injury of right lower leg, initial encounter: Secondary | ICD-10-CM | POA: Diagnosis not present

## 2017-09-08 DIAGNOSIS — S79911A Unspecified injury of right hip, initial encounter: Secondary | ICD-10-CM | POA: Diagnosis not present

## 2017-09-08 DIAGNOSIS — M79651 Pain in right thigh: Secondary | ICD-10-CM | POA: Diagnosis not present

## 2017-09-08 DIAGNOSIS — M25561 Pain in right knee: Secondary | ICD-10-CM | POA: Diagnosis not present

## 2017-09-19 DIAGNOSIS — E119 Type 2 diabetes mellitus without complications: Secondary | ICD-10-CM | POA: Diagnosis not present

## 2017-09-22 DIAGNOSIS — I252 Old myocardial infarction: Secondary | ICD-10-CM | POA: Diagnosis not present

## 2017-09-22 DIAGNOSIS — I701 Atherosclerosis of renal artery: Secondary | ICD-10-CM | POA: Diagnosis not present

## 2017-09-22 DIAGNOSIS — N184 Chronic kidney disease, stage 4 (severe): Secondary | ICD-10-CM | POA: Diagnosis not present

## 2017-09-22 DIAGNOSIS — I2510 Atherosclerotic heart disease of native coronary artery without angina pectoris: Secondary | ICD-10-CM | POA: Diagnosis not present

## 2017-09-24 DIAGNOSIS — I129 Hypertensive chronic kidney disease with stage 1 through stage 4 chronic kidney disease, or unspecified chronic kidney disease: Secondary | ICD-10-CM | POA: Diagnosis not present

## 2017-09-24 DIAGNOSIS — Z1339 Encounter for screening examination for other mental health and behavioral disorders: Secondary | ICD-10-CM | POA: Diagnosis not present

## 2017-09-24 DIAGNOSIS — E1165 Type 2 diabetes mellitus with hyperglycemia: Secondary | ICD-10-CM | POA: Diagnosis not present

## 2017-09-24 DIAGNOSIS — Z6827 Body mass index (BMI) 27.0-27.9, adult: Secondary | ICD-10-CM | POA: Diagnosis not present

## 2017-09-24 DIAGNOSIS — Z1331 Encounter for screening for depression: Secondary | ICD-10-CM | POA: Diagnosis not present

## 2017-09-24 DIAGNOSIS — M109 Gout, unspecified: Secondary | ICD-10-CM | POA: Diagnosis not present

## 2017-09-24 DIAGNOSIS — Z9181 History of falling: Secondary | ICD-10-CM | POA: Diagnosis not present

## 2017-09-24 DIAGNOSIS — E7800 Pure hypercholesterolemia, unspecified: Secondary | ICD-10-CM | POA: Diagnosis not present

## 2017-10-22 DIAGNOSIS — J0100 Acute maxillary sinusitis, unspecified: Secondary | ICD-10-CM | POA: Diagnosis not present

## 2017-10-22 DIAGNOSIS — Z6827 Body mass index (BMI) 27.0-27.9, adult: Secondary | ICD-10-CM | POA: Diagnosis not present

## 2017-10-22 DIAGNOSIS — J069 Acute upper respiratory infection, unspecified: Secondary | ICD-10-CM | POA: Diagnosis not present

## 2017-10-22 DIAGNOSIS — S8991XD Unspecified injury of right lower leg, subsequent encounter: Secondary | ICD-10-CM | POA: Diagnosis not present

## 2017-11-05 DIAGNOSIS — J309 Allergic rhinitis, unspecified: Secondary | ICD-10-CM | POA: Diagnosis not present

## 2017-11-05 DIAGNOSIS — Z6827 Body mass index (BMI) 27.0-27.9, adult: Secondary | ICD-10-CM | POA: Diagnosis not present

## 2017-12-04 DIAGNOSIS — E559 Vitamin D deficiency, unspecified: Secondary | ICD-10-CM | POA: Diagnosis not present

## 2017-12-04 DIAGNOSIS — E889 Metabolic disorder, unspecified: Secondary | ICD-10-CM | POA: Diagnosis not present

## 2017-12-04 DIAGNOSIS — N184 Chronic kidney disease, stage 4 (severe): Secondary | ICD-10-CM | POA: Diagnosis not present

## 2017-12-04 DIAGNOSIS — M9080 Osteopathy in diseases classified elsewhere, unspecified site: Secondary | ICD-10-CM | POA: Diagnosis not present

## 2017-12-04 DIAGNOSIS — I129 Hypertensive chronic kidney disease with stage 1 through stage 4 chronic kidney disease, or unspecified chronic kidney disease: Secondary | ICD-10-CM | POA: Diagnosis not present

## 2017-12-10 ENCOUNTER — Ambulatory Visit: Payer: PPO | Admitting: Allergy and Immunology

## 2017-12-10 ENCOUNTER — Encounter: Payer: Self-pay | Admitting: Allergy and Immunology

## 2017-12-10 VITALS — BP 122/68 | HR 76 | Resp 20

## 2017-12-10 DIAGNOSIS — T485X1A Poisoning by other anti-common-cold drugs, accidental (unintentional), initial encounter: Secondary | ICD-10-CM | POA: Diagnosis not present

## 2017-12-10 DIAGNOSIS — J310 Chronic rhinitis: Secondary | ICD-10-CM | POA: Diagnosis not present

## 2017-12-10 DIAGNOSIS — J4530 Mild persistent asthma, uncomplicated: Secondary | ICD-10-CM

## 2017-12-10 DIAGNOSIS — J3089 Other allergic rhinitis: Secondary | ICD-10-CM | POA: Diagnosis not present

## 2017-12-10 DIAGNOSIS — G4733 Obstructive sleep apnea (adult) (pediatric): Secondary | ICD-10-CM

## 2017-12-10 DIAGNOSIS — T485X5A Adverse effect of other anti-common-cold drugs, initial encounter: Secondary | ICD-10-CM

## 2017-12-10 NOTE — Progress Notes (Signed)
Follow-up Note  Referring Provider: [name], MD Primary Provider: [name], MD Date of Office Visit: 12/10/2017  Subjective:   Leah Walsh (DOB: 1941-07-04) is a 77 y.o. female who returns to the [facility] on 12/10/2017 in re-evaluation of the following:  HPI: Leah Walsh returns to this clinic in reevaluation of her asthma and rhinitis and sleep apnea.  I have not seen her in this clinic since August 2018.  Most recently she developed an episode of "sinusitis" which was manifested for the most part as facial pain and nasal congestion.  She was treated with an antibiotic and apparently did relatively well.  For the past month she thinks that she has been having problems with wheezing and coughing although she does not use a short acting bronchodilator.  It should be noted that she discontinued the use of her Qvar over the course of the past month or so.  Otherwise, Leah Walsh has not required a systemic steroid or an antibiotic since being seen in this clinic and for the most part she has done well.  She is using Afrin every night as this does help her CPAP use.  Allergies as of 12/10/2017      Reactions   Compazine [prochlorperazine Edisylate] Other (See Comments)   Stroke like symptoms   Colcrys [colchicine] Diarrhea   Hydrocodone    Codeine Nausea And Vomiting      Medication List      albuterol 108 (90 Base) MCG/ACT inhaler Commonly known as:  PROVENTIL HFA Inhale two puffs every four to six hours as needed for cough or wheeze   allopurinol 100 MG tablet Commonly known as:  ZYLOPRIM Take 100 mg by mouth daily.   amLODipine 5 MG tablet Commonly known as:  NORVASC Take 5 mg by mouth 2 (two) times daily.   aspirin 81 MG tablet Take 81 mg by mouth daily.   atorvastatin 80 MG tablet Commonly known as:  LIPITOR Take 80 mg by mouth at bedtime.   B-D UF III MINI PEN NEEDLES 31G X 5 MM Misc Generic drug:  Insulin Pen Needle   carvedilol 12.5 MG  tablet Commonly known as:  COREG Take 12.5 mg by mouth 3 (three) times daily.   CoQ10 200 MG Caps Take 1 capsule by mouth daily.   docusate sodium 100 MG capsule Commonly known as:  COLACE TAKE 1 CAPSULE (100 MG TOTAL) BY MOUTH TWO (2) TIMES A DAY.   FISH OIL PO Take by mouth daily.   furosemide 40 MG tablet Commonly known as:  LASIX   hydrALAZINE 50 MG tablet Commonly known as:  APRESOLINE Take 50 mg by mouth 4 (four) times daily.   LANTUS SOLOSTAR 100 UNIT/ML Solostar Pen Generic drug:  Insulin Glargine 40 Units 2 (two) times daily.   losartan 50 MG tablet Commonly known as:  COZAAR Take 50 mg by mouth daily.   MELATONIN PO Take by mouth at bedtime.   montelukast 10 MG tablet Commonly known as:  SINGULAIR Take 1 tablet (10 mg total) by mouth daily.   multivitamin tablet Take 1 tablet by mouth daily.   NITROSTAT 0.4 MG SL tablet Generic drug:  nitroGLYCERIN Place 0.4 mg under the tongue every 5 (five) minutes as needed.   NOVOLOG FLEXPEN 100 UNIT/ML FlexPen Generic drug:  insulin aspart   NUTRITIONAL SUPPLEMENT PO Take by mouth. OTC supplement to help with cholesterol. Patient unsure of name.   omeprazole 40 MG capsule Commonly known  as:  PRILOSEC Take 40 mg by mouth daily.   PRESERVISION AREDS 2 PO Take by mouth.   QVAR REDIHALER 80 MCG/ACT inhaler Generic drug:  beclomethasone Inhale 2 puffs into the lungs 2 (two) times daily.   Vitamin D 2000 units Caps Take 1 capsule by mouth daily.       Past Medical History:  Diagnosis Date  . Asthma   . Diabetes ([place])   . Heart attack ([place]) 2014  . Hyperlipidemia   . Hypertension   . Kidney disease   . Sleep apnea    Using CPAP    Past Surgical History:  Procedure Laterality Date  . ABDOMINAL HYSTERECTOMY    . CAROTID STENT    . CHOLECYSTECTOMY    . TUBAL LIGATION    . 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 VESICOVAGINAL FISTULA CLOSURE W/ TAH      Review of systems negative except  as noted in HPI / PMHx or noted below:  Review of Systems  Constitutional: Negative.   HENT: Negative.   Eyes: Negative.   Respiratory: Negative.   Cardiovascular: Negative.   Gastrointestinal: Negative.   Genitourinary: Negative.   Musculoskeletal: Negative.   Skin: Negative.   Neurological: Negative.   Endo/Heme/Allergies: Negative.   Psychiatric/Behavioral: Negative.      Objective:   Vitals:   12/10/17 1530  BP: 122/68  Pulse: 76  Resp: 20          Physical Exam  HENT:  Head: Normocephalic.  Right Ear: Tympanic membrane, external ear and ear canal normal.  Left Ear: Tympanic membrane, external ear and ear canal normal.  Nose: Nose normal. No mucosal edema or rhinorrhea.  Mouth/Throat: Uvula is midline, oropharynx is clear and moist and mucous membranes are normal. No oropharyngeal exudate.  Eyes: Conjunctivae are normal.  Neck: Trachea normal. No tracheal tenderness present. No tracheal deviation present. No thyromegaly present.  Cardiovascular: Normal rate, regular rhythm, S1 normal, S2 normal and normal heart sounds.  No murmur heard. Pulmonary/Chest: Breath sounds normal. No stridor. No respiratory distress. She has no wheezes. She has no rales.  Musculoskeletal: She exhibits no edema.  Lymphadenopathy:       Head (right side): No tonsillar adenopathy present.       Head (left side): No tonsillar adenopathy present.    She has no cervical adenopathy.  Neurological: She is alert.  Skin: No rash noted. She is not diaphoretic. No erythema. Nails show no clubbing.    Diagnostics:    Spirometry was performed and demonstrated an FEV1 of 2.03 at 88 % of predicted.  The patient had an Asthma Control Test with the following results: ACT Total Score: 15.    Assessment and Plan:   1. Not well controlled mild persistent asthma   2. Other allergic rhinitis   3. Rhinitis medicamentosa   4. Obstructive sleep apnea      1.  Restart QVAR 80 REDIHALER 2  inhalations ONE time per day   2. Continue Montelukast 10mg  one tablet one time per day  3. Use a combination of Afrin - one spray each nostril + OTC Nasacort - one spray each nostril in evening.  4. If Needed:    A. Proventil HFA 2 puffs every 4-6 hours if needed  B. Nasal saline spray  C. OTC Loratadine 10mg  one tablet one time   5. Return to clinic in 6 months or sooner if needed  Leah Walsh needs to use a inhaled steroid  as well as a leukotriene modifier to prevent her from developing significant inflammation of her airway which she has demonstrated in the past.  She has rather significant diabetes and we need to make sure that she is not put in a situation where she needs to receive systemic steroids to treat an asthma exacerbation.  I am going to have her utilize a over-the-counter nasal steroid in addition to her Afrin with the hope of preventing her from developing significant rebound with the use of Afrin.  Obviously Afrin is helping her significantly regarding use of her CPAP machine.  I will see her back in his clinic in 6 months or sooner if there is a problem.  Allena Katz, MD Allergy / Immunology [place]

## 2017-12-10 NOTE — Patient Instructions (Addendum)
     1.  Restart QVAR 80 REDIHALER 2 inhalations ONE time per day   2. Continue Montelukast 10mg  one tablet one time per day  3. Use a combination of Afrin - one spray each nostril + OTC Nasacort - one spray each nostril in evening.  4. If Needed:    A. Proventil HFA 2 puffs every 4-6 hours if needed  B. Nasal saline spray  C. OTC Loratadine 10mg  one tablet one time   5. Return to clinic in 6 months or sooner if needed

## 2017-12-11 ENCOUNTER — Encounter: Payer: Self-pay | Admitting: Allergy and Immunology

## 2017-12-11 DIAGNOSIS — N184 Chronic kidney disease, stage 4 (severe): Secondary | ICD-10-CM | POA: Diagnosis not present

## 2017-12-11 DIAGNOSIS — E889 Metabolic disorder, unspecified: Secondary | ICD-10-CM | POA: Diagnosis not present

## 2017-12-11 DIAGNOSIS — I129 Hypertensive chronic kidney disease with stage 1 through stage 4 chronic kidney disease, or unspecified chronic kidney disease: Secondary | ICD-10-CM | POA: Diagnosis not present

## 2017-12-11 DIAGNOSIS — I701 Atherosclerosis of renal artery: Secondary | ICD-10-CM | POA: Diagnosis not present

## 2017-12-18 DIAGNOSIS — E1122 Type 2 diabetes mellitus with diabetic chronic kidney disease: Secondary | ICD-10-CM | POA: Diagnosis not present

## 2017-12-24 DIAGNOSIS — E1122 Type 2 diabetes mellitus with diabetic chronic kidney disease: Secondary | ICD-10-CM | POA: Diagnosis not present

## 2017-12-24 DIAGNOSIS — Z6828 Body mass index (BMI) 28.0-28.9, adult: Secondary | ICD-10-CM | POA: Diagnosis not present

## 2018-01-28 ENCOUNTER — Ambulatory Visit (INDEPENDENT_AMBULATORY_CARE_PROVIDER_SITE_OTHER): Payer: PPO | Admitting: Ophthalmology

## 2018-01-28 DIAGNOSIS — H353132 Nonexudative age-related macular degeneration, bilateral, intermediate dry stage: Secondary | ICD-10-CM | POA: Diagnosis not present

## 2018-01-28 DIAGNOSIS — H35033 Hypertensive retinopathy, bilateral: Secondary | ICD-10-CM | POA: Diagnosis not present

## 2018-01-28 DIAGNOSIS — E113311 Type 2 diabetes mellitus with moderate nonproliferative diabetic retinopathy with macular edema, right eye: Secondary | ICD-10-CM | POA: Diagnosis not present

## 2018-01-28 DIAGNOSIS — I10 Essential (primary) hypertension: Secondary | ICD-10-CM

## 2018-01-28 DIAGNOSIS — H34831 Tributary (branch) retinal vein occlusion, right eye, with macular edema: Secondary | ICD-10-CM

## 2018-01-28 DIAGNOSIS — H43813 Vitreous degeneration, bilateral: Secondary | ICD-10-CM | POA: Diagnosis not present

## 2018-01-28 DIAGNOSIS — E11311 Type 2 diabetes mellitus with unspecified diabetic retinopathy with macular edema: Secondary | ICD-10-CM

## 2018-01-28 LAB — HM DIABETES EYE EXAM

## 2018-01-30 ENCOUNTER — Other Ambulatory Visit: Payer: Self-pay

## 2018-01-30 NOTE — Patient Outreach (Signed)
[facility]) Care Management  01/30/2018  Leah Walsh May 16, 1941 [id number]   Referral Date: 01/30/18 Referral Source: HTA Referral Referral Reason: Unable to afford insulin   Outreach Attempt #1 Telephone call to patient for HTA Concierge referral. No answer.  HIPAA compliant voice message left.     Plan: RN CM will send letter and attempt patient again within 4 business days.     Jone Baseman, RN, MSN [facility] Care Management Care Management Coordinator Direct Line [phone] Toll Free: [phone]  Fax: [phone]

## 2018-01-30 NOTE — Patient Outreach (Signed)
[facility]) Care Management  01/30/2018  Leah Walsh Jun 09, 1941 [id number]   Referral Date: 01/30/18 Referral Source: HTA Concierge Referral Reason: Unable to afford insulin.   Incoming call for patient. Patient able to verify HIPAA,  Discussed reason for referral.  Patient reports that she has a hard time paying for her lantus, novolog, and restasis.  Patient reports her insulin alone is about 700.00.   Social: Patient lives in the home with spouse.  Patient is independent with all aspects of care.   Conditions: Patient admits to DM for 28 years. Last A1c 8.0 and wants to get it down.  Physician goal is 7.0.  However patient reports not taking insulin as she should due to cost.  Patient also reports some kidney disease, heart disease, heart attack, HTN, and asthma.  Patient also reports some problems with her blood pressure being increased at times. Patient also has dry macular degeneration and received eye injection recently.     Medications: Patient reports that her insulin cost are about $700.00 but was able to get it at [facility] this time for $38.00 patient states she was told that they are sure what the cost will be next time.  Patient also reports that her restasis eye drops are expensive as well cutting into medication costs.   Appointments:  Patient saw PCP about 3 weeks ago.   Consent: Discussed with patient [place].  Patient agreeable to health coach for disease management of DM and HTN and pharmacy for medication assistance.   Plan: RN CM will refer to health coach and pharmacy.     Jone Baseman, RN, MSN [facility] Care Management Care Management Coordinator Direct Line [phone] Toll Free: [phone]  Fax: [phone]

## 2018-02-02 ENCOUNTER — Encounter: Payer: Self-pay | Admitting: *Deleted

## 2018-02-03 ENCOUNTER — Ambulatory Visit: Payer: PPO

## 2018-02-09 ENCOUNTER — Other Ambulatory Visit: Payer: Self-pay | Admitting: Pharmacist

## 2018-02-09 NOTE — Patient Outreach (Signed)
[place] [facility] [facility]) Care Management  02/09/2018  Leah Walsh 11/08/1940 [id number]   77 year old female referred to [place] [facility] by HTA concierge for medication assistance with insulin.   Unsuccessful telephone call #1 to [name] today.  I left a HIPPA compliant voicemail on the home phone.    Plan: I will send patient a letter describing [facility] services and follow-up again in 3-4 business days telephonically.   Ralene Bathe, PharmD, [place] [phone]

## 2018-02-10 ENCOUNTER — Other Ambulatory Visit: Payer: Self-pay | Admitting: Pharmacist

## 2018-02-10 ENCOUNTER — Other Ambulatory Visit: Payer: Self-pay | Admitting: Pharmacy Technician

## 2018-02-10 NOTE — Patient Outreach (Signed)
[facility]) Care Management  02/10/2018  Liyana Suniga [birth year] [id number]   Incoming call and voicemail received from Ms. [name].  HIPAA identifiers verified. Patient agreeable to review medications.    Subjective:  Ms. Ragan reports that she is having trouble affording her insulin and eye drops.  She states she and her husband live paycheck to paycheck and often has to ration her insulin.  She reports she checks her blood sugar several times throughout the day and has all the diabetic testing supplies that she needs.  She states she is in the coverage gap and has likely spent close to $2000 thus far in [place] in 2019.    Objective: Medications Reviewed Today    Reviewed by Rudean Haskell, RPH (Pharmacist) on 02/10/18 at 30  Med List Status: <None>  Medication Order Taking? Sig Documenting Provider Last Dose Status Informant  albuterol (PROVENTIL HFA) 108 (90 Base) MCG/ACT inhaler [id number] Yes Inhale two puffs every four to six hours as needed for cough or wheeze Kozlow, Donnamarie Poag, MD Taking Active   allopurinol (ZYLOPRIM) 100 MG tablet [id number] Yes Take 100 mg by mouth daily. [provider] Taking Active            Med Note Lockie Pares, KATIE C   Thu Aug 04, 2014 10:17 AM) Received from: External Pharmacy  amLODipine (NORVASC) 5 MG tablet [id number] Yes Take 5 mg by mouth daily.  [provider] Taking Active            Med Note Lockie Pares, KATIE C   Thu Aug 04, 2014 10:17 AM) Received from: External Pharmacy  aspirin 81 MG tablet [id number] Yes Take 81 mg by mouth daily. [provider] Taking Active   atorvastatin (LIPITOR) 80 MG tablet [id number] Yes Take 80 mg by mouth at bedtime. [provider] Taking Active            Med Note Lockie Pares, Elmer Sow   Thu Aug 04, 2014 10:17 AM) Received from: External Pharmacy  B-D UF III MINI PEN NEEDLES 31G X 5 MM MISC [id number] Yes  [provider] Taking Active            Med Note  Lockie Pares, KATIE C   Thu Aug 04, 2014 10:17 AM) Received from: External Pharmacy  beclomethasone (QVAR REDIHALER) 80 MCG/ACT inhaler [id number] Yes Inhale 2 puffs into the lungs 2 (two) times daily. [provider] Taking Active   carvedilol (COREG) 12.5 MG tablet [id number] Yes Take 12.5 mg by mouth 2 (two) times daily with a meal.  [provider] Taking Active            Med Note Lockie Pares, KATIE C   Thu Aug 04, 2014 10:17 AM) Received from: External Pharmacy  Cholecalciferol (VITAMIN D) 2000 UNITS CAPS [id number] Yes Take 1 capsule by mouth daily. [provider] Taking Active   Coenzyme Q10 (COQ10) 200 MG CAPS [id number] Yes Take 1 capsule by mouth daily. [provider] Taking Active   cycloSPORINE (RESTASIS) 0.05 % ophthalmic emulsion [id number] Yes Place 1 drop into both eyes 2 (two) times daily. [provider] Taking Active         Discontinued 02/10/18 1223 (Completed Course)   furosemide (LASIX) 40 MG tablet [id number] Yes Take 40 mg by mouth 2 (two) times daily.  [provider] Taking Active            Med Note Noemi Chapel Apr 17, 2017 11:25  AM) Taking 2 tablets QAM   hydrALAZINE (APRESOLINE) 50 MG tablet [id number] Yes Take 50 mg by mouth 3 (three) times daily.  [provider] Taking Active            Med Note Rise Paganini   Thu Apr 17, 2017 11:25 AM) Taking BID  LANTUS SOLOSTAR 100 UNIT/ML Solostar Pen [id number] Yes 40 Units 2 (two) times daily.  [provider] Taking Active            Med Note Lockie Pares, KATIE C   Thu Aug 04, 2014 10:17 AM) Received from: External Pharmacy  losartan (COZAAR) 50 MG tablet [id number] Yes Take 50 mg by mouth daily.  [provider] Taking Active            Med Note Noemi Chapel Apr 17, 2017 11:26 AM) Taking BID        Discontinued 02/10/18 1224 (Completed Course)   montelukast (SINGULAIR) 10 MG tablet [id number] Yes Take 1 tablet (10 mg total) by mouth  daily. Kozlow, Donnamarie Poag, MD Taking Active   Multiple Vitamin (MULTIVITAMIN) tablet [id number] Yes Take 1 tablet by mouth daily. [provider] Taking Active   Multiple Vitamins-Minerals (PRESERVISION AREDS 2 PO) [id number] Yes Take by mouth. [provider] Taking Active   NITROSTAT 0.4 MG SL tablet [id number] Yes Place 0.4 mg under the tongue every 5 (five) minutes as needed.  [provider] Taking Active            Med Note Lockie Pares, KATIE C   Thu Aug 04, 2014 10:18 AM) Received from: External Pharmacy  NOVOLOG FLEXPEN 100 UNIT/ML FlexPen [id number] Yes 16 Units 3 (three) times daily with meals.  [provider] Taking Active         Discontinued 02/10/18 1225 (Completed Course)   Omega-3 Fatty Acids (FISH OIL PO) [id number] Yes Take 1 capsule by mouth daily.  [provider] Taking Active   omeprazole (PRILOSEC) 40 MG capsule [id number] Yes Take 40 mg by mouth daily.  [provider] Taking Active          Assessment:   Drugs sorted by system:  Neurologic/Psychologic:  Cardiovascular: amlodipine, aspirin, atorvastatin, carvedilol, hydralazine, losartan, PRN NTG SL, omega-3 fatty acids  Pulmonary/Allergy: albuterol, beclomethasone inhaler, montelukast  Gastrointestinal:omeprazole  Endocrine: Lantus, Novolog  Renal:  Topical: cyclosporine eye drops  Pain:  Vitamins/Minerals:cholecalciferol, Co-Enzyme Q10, MVI, preservision areds  Infectious Diseases:  Gout: allopurinol   Medication issues: Patient taking Qvar inhaler (beclomethasone) once daily as instructed by Dr. Carmelina Peal.  She reports that she is aware usual dosing is BID but her provider and she have discussed that she only needs to take it once daily.   Medication assistance:  Patient may qualify for PAPs for Restasis, Novolog, and Lantus based on stated income and stated TROOP.  She is agreeable to apply and will look for financial documents to include with  applications.   Plan: I will route patient assistance letter to pharmacy technician, Etter Sjogren, who will coordinate application process for: 1) Restasis prescribed by Dr. Monna Fam through [employer] PAP 2) Lantus prescribed by Dr. Kennith Maes through [employer]of[employer] PAP 3) Novolog prescribed by Dr. Kennith Maes through [place] technician will assist with obtaining all pertinent documents from both patient and providers and submit application once completed.    Ralene Bathe, PharmD, [place] [phone]

## 2018-02-10 NOTE — Patient Outreach (Signed)
[facility]) Care Management  02/10/2018  Leah Walsh [birth year] [id number]   Received patient assistance referral from [place], prepared and faxed the following: Prepared patient portion of [employer] (Novolog), [employer]ofi (Lantus), and [employer] (Restasis) to be mailed to patient. Faxed provider portion of [employer] and Sanofi to Dr. Helene Kelp and faxed provider portion of [employer]er[employer] to Dr. Herbert Deaner.  Will follow up with patient in 5-7 business days to confirm applications have been received.  Maud Deed [place], [place] Management [phone]

## 2018-02-12 ENCOUNTER — Encounter: Payer: Self-pay | Admitting: Pharmacist

## 2018-02-12 ENCOUNTER — Ambulatory Visit: Payer: Self-pay | Admitting: Pharmacist

## 2018-02-12 NOTE — Telephone Encounter (Signed)
This encounter was created in error - please disregard.

## 2018-02-24 ENCOUNTER — Encounter (INDEPENDENT_AMBULATORY_CARE_PROVIDER_SITE_OTHER): Payer: PPO | Admitting: Ophthalmology

## 2018-02-24 DIAGNOSIS — H353132 Nonexudative age-related macular degeneration, bilateral, intermediate dry stage: Secondary | ICD-10-CM | POA: Diagnosis not present

## 2018-02-24 DIAGNOSIS — E113311 Type 2 diabetes mellitus with moderate nonproliferative diabetic retinopathy with macular edema, right eye: Secondary | ICD-10-CM | POA: Diagnosis not present

## 2018-02-24 DIAGNOSIS — E11311 Type 2 diabetes mellitus with unspecified diabetic retinopathy with macular edema: Secondary | ICD-10-CM

## 2018-02-24 DIAGNOSIS — I10 Essential (primary) hypertension: Secondary | ICD-10-CM

## 2018-02-24 DIAGNOSIS — H35033 Hypertensive retinopathy, bilateral: Secondary | ICD-10-CM

## 2018-02-24 DIAGNOSIS — H34831 Tributary (branch) retinal vein occlusion, right eye, with macular edema: Secondary | ICD-10-CM

## 2018-02-24 DIAGNOSIS — H43813 Vitreous degeneration, bilateral: Secondary | ICD-10-CM | POA: Diagnosis not present

## 2018-03-04 DIAGNOSIS — H04123 Dry eye syndrome of bilateral lacrimal glands: Secondary | ICD-10-CM | POA: Diagnosis not present

## 2018-03-04 DIAGNOSIS — H40013 Open angle with borderline findings, low risk, bilateral: Secondary | ICD-10-CM | POA: Diagnosis not present

## 2018-03-04 DIAGNOSIS — H16223 Keratoconjunctivitis sicca, not specified as Sjogren's, bilateral: Secondary | ICD-10-CM | POA: Diagnosis not present

## 2018-03-04 DIAGNOSIS — H01003 Unspecified blepharitis right eye, unspecified eyelid: Secondary | ICD-10-CM | POA: Diagnosis not present

## 2018-03-05 ENCOUNTER — Other Ambulatory Visit: Payer: Self-pay | Admitting: Pharmacy Technician

## 2018-03-05 NOTE — Patient Outreach (Signed)
[facility] Care Management  03/05/2018  Leah Walsh April 04, 1941 [id number]   Unsuccessful outreach attempt #1, HIPAA compliant voicemail left.  Will make 2nd call attempt in 2-3 business days  Maud Deed. [place], [place] Management [phone]

## 2018-03-06 ENCOUNTER — Other Ambulatory Visit: Payer: Self-pay | Admitting: *Deleted

## 2018-03-06 NOTE — Patient Outreach (Signed)
[facility]) Care Management  03/06/2018  Wilhelmena Zea 10/08/1940 [id number]   RN Health Coach Initial Assessment  Referral Date:  02/02/2018 Referral Source:  HTA Concierge Reason for Referral:  Medication Assistance Insurance:  [employer]   Outreach Attempt:  Outreach attempt #1 to patient for initial telephone assessment. No answer. RN Health Coach left HIPAA compliant voicemail message along with contact information.  Plan:  RN Health Coach will send unsuccessful outreach letter to patient.  RN Health Coach will make another outreach attempt to patient within 3-4 business days if no return call back from patient.   [place] Coach [phone] [email or web address]

## 2018-03-09 ENCOUNTER — Other Ambulatory Visit: Payer: Self-pay | Admitting: *Deleted

## 2018-03-09 ENCOUNTER — Other Ambulatory Visit: Payer: Self-pay | Admitting: Pharmacy Technician

## 2018-03-09 NOTE — Patient Outreach (Signed)
[facility]) Care Management  03/09/2018  Meenakshi Sazama 1941-08-09 [id number]   Successful outreach attempt to Mrs. Lapenna, HIPAA identifiers verified. Mrs. Villanueva confirms that she has received the patient assistance applications that were mailed to her. She states she hasn't filled them out yet and that she has additional finance questions about the applications. I informed her that I would have [place] give her a call to attempt to answer her questions.  Will route note to [place] to follow up with patient.  Maud Deed [place], [place] Management [phone]

## 2018-03-09 NOTE — Patient Outreach (Signed)
[place] [facility]) Care Management  03/09/2018  Mallary Kreger Nov 03, 1940 [id number]   RN Health Coach Initial Assessment  Referral Date:  02/02/2018 Referral Source:  HTA Concierge Reason for Referral:  Medication Assistance Insurance:  [employer]   Outreach Attempt:  Outreach attempt #2 to patient for initial telephone assessment.  Patient answered and verified HIPAA.  RN Health Coach introduced self and role.  Patient verbally agrees to monthly telephone outreaches.  States she is unable to complete initial telephone assessment today and would like a call back another day.  Plan:  RN Health Coach will attempt another outreach to complete initial telephone assessment within the next 3-4 business days.   [place] [phone] [email or web address]

## 2018-03-10 ENCOUNTER — Ambulatory Visit: Payer: PPO | Admitting: Pharmacy Technician

## 2018-03-10 ENCOUNTER — Other Ambulatory Visit: Payer: Self-pay | Admitting: Pharmacist

## 2018-03-10 NOTE — Patient Outreach (Addendum)
[facility]) Care Management  03/10/2018  Leah Walsh 1940-09-06 [id number]   Message received from [place] that patient had question regarding patient assistance applications.   Unsuccessful call placed to Leah Walsh today.  I left a HIPAA compliant voicemail on the home / mobile phone.   Plan: I will reach out to Leah Walsh again later this week unless I have heard back from her beforehand.   Ralene Bathe, PharmD, [place] [phone]    Addendum: Incoming call and voicemail received form Leah Walsh.   Return call to patient.  Patient reports income is slightly higher than previously stated and wants to confirm she is still eligible for PAPs.  I reviewed the following information on income per specific PAP websites:   Lantus PAP income: $42,275 Novolog PAP income: $67,640 Restasis PAP income: Not listed.   Although patient reports she makes over the Lantus PAP income, I recommended that we still apply as sometimes the drug companies make exceptions to the income.  We reviewed that patient will need a handout of TROOP and how to request this from her pharmacy.  Patient expressed understanding.  She reports she was out of town last week and has not had a chance to look at applications in detail yet.    Patient requests assistance with applying for [place] which she will need to take after an upcoming eye injection on August 6th.  I reviewed with patient that we may not be able to have medication PAP decision by this time as usually applications take at least 6-8 weeks.  Patient voiced understanding.  She is willing to meet at [facility] office to expedite application process.  Patient will bring completed applications for other medications to [facility] at this time.    Plan: I will meet with patient at [facility] next Monday, July 22nd to complete Besivance PAP and receive other completed applications for Novolog,  Lantus, and Restasis.   Ralene Bathe, PharmD, [place] [phone]

## 2018-03-12 ENCOUNTER — Encounter: Payer: Self-pay | Admitting: *Deleted

## 2018-03-12 ENCOUNTER — Other Ambulatory Visit: Payer: Self-pay | Admitting: *Deleted

## 2018-03-12 NOTE — Patient Outreach (Signed)
[facility]) Care Management  [place]  03/12/2018   Leah Walsh May 04, 1941 [id number]   RN Health Coach Initial Assessment   Referral Date:  02/02/2018 Referral Source:  HTA Concierge Reason for Referral:  Medication Assistance Insurance:  Health Team Advantage   Outreach Attempt:  Successful telephone outreach to patient for initial telephone assessment.  HIPAA verified with patient.  Patient completed initial telephone assessment.  Social:  Patient lives at home with husband.  Ambulates independently and reports 2 falls in the month of December causing knee pain.  Reports being independent with ADLs and IADLs.  Patient states she transports herself to her medical appointments in town and her husband drives her to medical appointments out of town.  DME in the home include:  Libre continuous blood glucose meter, built in shower seat, eyeglasses, grab bars in the shower, and Rolator walker.  Conditions:  Per chart review and discussion with patient, PMH include but not limited to:  Asthma, diabetes, heart attack with stenting, hyperlipidemia, hypertension, stage 3 chronic kidney disease, sleep apnea, carotid stent, bilateral cataracts removed and cholecystectomy.  Patient reports being recently diagnosed with Macular Degeneration and starting to receive eye injections.  Wears the [place] continuous blood glucose monitor system and monitors blood glucose at least 4 times a day.  This mornings fasting blood sugar was 93, with patient reporting fasting ranges from 40-120's.  Patient verbalizes multiple episodes of hypoglycemia, usually in the mornings or the middle of the night. Reports at times she does not take scheduled insulins due to fear of hypoglycemia.  Encouraged patient to discuss insulins and hypoglycemic episodes with primary care provider.  Reports last Hgb A1C was 8.5 11/2017.  Patient also endorsing her blood pressures are elevated at times.  States she does  not have blood pressure cuff at home, discussed with patient [facility] will try to provide patient with blood pressure cuff to help monitor blood pressures at home.  Medications:  Reports taking about 13 medications.  Manages medications herself without difficulties.  Self administers insulins.  Reports having difficulties affording medications.  [place] is assisting patient with medication assistance forms.  Encounter Medications:  Outpatient Encounter Medications as of 03/12/2018  Medication Sig Note  . albuterol (PROVENTIL HFA) 108 (90 Base) MCG/ACT inhaler Inhale two puffs every four to six hours as needed for cough or wheeze   . allopurinol (ZYLOPRIM) 100 MG tablet Take 100 mg by mouth daily. 08/04/2014: Received from: External Pharmacy  . amLODipine (NORVASC) 5 MG tablet Take 5 mg by mouth daily.  08/04/2014: Received from: External Pharmacy  . aspirin 81 MG tablet Take 81 mg by mouth daily.   [name] atorvastatin (LIPITOR) 80 MG tablet Take 80 mg by mouth at bedtime. 08/04/2014: Received from: External Pharmacy  . B-D UF III MINI PEN NEEDLES 31G X 5 MM MISC  08/04/2014: Received from: External Pharmacy  . beclomethasone (QVAR REDIHALER) 80 MCG/ACT inhaler Inhale 2 puffs into the lungs 2 (two) times daily.   . carvedilol (COREG) 12.5 MG tablet Take 12.5 mg by mouth 2 (two) times daily with a meal.  08/04/2014: Received from: External Pharmacy  . Coenzyme Q10 (COQ10) 200 MG CAPS Take 1 capsule by mouth daily.   . cycloSPORINE (RESTASIS) 0.05 % ophthalmic emulsion Place 1 drop into both eyes 2 (two) times daily.   . furosemide (LASIX) 40 MG tablet Take 40 mg by mouth 2 (two) times daily.  04/17/2017: Taking 2 tablets QAM   . hydrALAZINE (  APRESOLINE) 50 MG tablet Take 50 mg by mouth 3 (three) times daily.  03/12/2018: Reports taking 3 times a day  . LANTUS SOLOSTAR 100 UNIT/ML Solostar Pen 40 Units 2 (two) times daily.  08/04/2014: Received from: External Pharmacy  . losartan (COZAAR) 50 MG tablet Take  50 mg by mouth daily.  04/17/2017: Taking BID  . montelukast (SINGULAIR) 10 MG tablet Take 1 tablet (10 mg total) by mouth daily.   . Multiple Vitamin (MULTIVITAMIN) tablet Take 1 tablet by mouth daily.   . Multiple Vitamins-Minerals (PRESERVISION AREDS 2 PO) Take by mouth.   [name] NITROSTAT 0.4 MG SL tablet Place 0.4 mg under the tongue every 5 (five) minutes as needed.  08/04/2014: Received from: External Pharmacy  . NOVOLOG FLEXPEN 100 UNIT/ML FlexPen 16 Units 3 (three) times daily with meals.    . Omega-3 Fatty Acids (FISH OIL PO) Take 1 capsule by mouth daily.    [name] omeprazole (PRILOSEC) 40 MG capsule Take 40 mg by mouth daily.    . Cholecalciferol (VITAMIN D) 2000 UNITS CAPS Take 1 capsule by mouth daily. 03/12/2018: Reports not taking currently   No facility-administered encounter medications on file as of 03/12/2018.     Functional Status:  In your present state of health, do you have any difficulty performing the following activities: 03/12/2018  Hearing? N  Vision? Y  Comment Macular degeneration  Difficulty concentrating or making decisions? N  Walking or climbing stairs? Y  Comment knee pain after falls  Dressing or bathing? N  Doing errands, shopping? N  Preparing Food and eating ? N  Using the Toilet? N  In the past six months, have you accidently leaked urine? N  Do you have problems with loss of bowel control? N  Managing your Medications? N  Managing your Finances? N  Housekeeping or managing your Housekeeping? N  Some recent data might be hidden    Fall/Depression Screening: Fall Risk  03/12/2018  Falls in the past year? Yes  Comment fall x2 in December  Number falls in past yr: 2 or more  Injury with Fall? No  Risk Factor Category  High Fall Risk  Risk for fall due to : History of fall(s);Medication side effect;Impaired mobility;Impaired vision  Follow up Falls prevention discussed;Education provided;Falls evaluation completed   PHQ 2/9 Scores 03/12/2018 01/30/2018   PHQ - 2 Score 0 0   THN CM Care Plan Problem One     Most Recent Value  Care Plan Problem One  Financial deficiets related to diabetes management  Role Documenting the Problem One  [place] for Problem One  Active  THN Long Term Goal   Patient will reduce her Hgb A1C by 0.5 points in the next 90 days.  THN Long Term Goal Start Date  03/12/18  Interventions for Problem One Long Term Goal  Reviewed and discussed care plan and goals with patient, confirmed patient has diabetes education material at home, encouraged to keep and attend medical appointments, reviewed medications and encouraged medication compliance, discussed with patient frequency of monitoring blood glucose with the continuous monitoring device, encouraged patient to utilize diabetic diet with heart healthy diet, discussed current Hgb A1C and what the goal is and how to reduce to goal range  THN CM Short Term Goal #1   Patient will complete medication assistance forms and mail off forms in the next 30 days.  THN CM Short Term Goal #1 Start Date  03/12/18  Interventions for Short Term Goal #  1  Reviewed medications and encouraged compliance, discussed insulin copays and inaffordability of copays, encouraged patient to continue to work with [facility] Pharmacist and Pharmacy Tech to complete applications,   THN CM Short Term Goal #2   Patient will report a decrease in the number of hypoglycemic events in the next 30 days.  THN CM Short Term Goal #2 Start Date  03/12/18  Interventions for Short Term Goal #2  Reviewed signs and symptoms of hypo and hyperglycemia, encouraged patient to document blood sugar readings and doses of insulin she is taking at the time of readings to discuss with her physician, encouraged patient to eat bedtime snack to prevent hypoglycemia in the mornings, discussed dangers of hypoglycemia      Appointments:  Reports attending primary care appointment with Dr. Helene Kelp on 12/24/2017 and has scheduled follow up  on 03/26/2018.  Scheduled Cardiology follow up on 06/24/2018.  Advanced Directives:  Denies having an Advance Directive in place.  Does request information to create one.   Consent:  [facility] services reviewed and discussed.  Patient verbally agrees to monthly telephone outreaches and continued assistance from [place].  Plan: RN Health Coach will send primary MD barriers letter. RN Health Coach will route initial telephone assessment note to primary MD. Patient to reviewed Diabetes Educational Booklet she already has at home. RN Health Coach will send patient [facility]. RN Health Coach will send patient 2019 Calendar Booklet. RN Health Coach will send patient Emergency planning/management officer. RN Health Coach will send patient home scale. RN Health Coach will send patient blood pressure cuff for home. RN Health Coach will make next monthly outreach to patient in the month of August.  Kanin Lia RN [place] [phone] [email or web address]

## 2018-03-13 ENCOUNTER — Ambulatory Visit: Payer: PPO | Admitting: Pharmacist

## 2018-03-16 ENCOUNTER — Other Ambulatory Visit: Payer: Self-pay | Admitting: Pharmacist

## 2018-03-16 NOTE — Patient Outreach (Signed)
[facility]) Care Management  03/16/2018  Leah Walsh [birth year] [id number]  Appointment with patient at [facility] office.   Assisted patient with completing PAP applications for Restasis, Novolog, Lantus, and Besivance.  Patient provided copy of financial documents and insurance EOB.    Blood pressure cuff provided to patient per [facility] health coach request.  I reviewed and demonstrated how to use machine.  Patient voiced understanding.  Scale has been ordered and will be shipped directly to patient's home.   Plan: [place] technician will assist with receiving provider portion of applications and submitting application once all forms completed.    Ralene Bathe, PharmD, [place] [phone]

## 2018-03-19 ENCOUNTER — Other Ambulatory Visit: Payer: Self-pay | Admitting: Pharmacy Technician

## 2018-03-19 NOTE — Patient Outreach (Signed)
[facility]) Care Management  03/20/2018  Leah Walsh 10-19-1940 [id number]  Received patient portion of patient assistance applications. Faxed the following completed application and required documents to: [employer] (Novolog), [employer] (Lantus) and [employer] (Restasis). Also received new referral for Besivance thru [place], have patient portion and faxed provider portion to Dr. Herbert Deaner for completion.  Will follow up with patient assistance companies in the next 2-28 business days based on company processing times.  Maud Deed [place], [place] Management [phone]

## 2018-03-20 ENCOUNTER — Other Ambulatory Visit: Payer: Self-pay | Admitting: Pharmacy Technician

## 2018-03-20 NOTE — Patient Outreach (Signed)
[facility]) Care Management  03/20/2018  Lyssa Hackley 01-05-1941 [id number]   Follow up call to [place] patient assistance to check status of patients application for Novolog. Barry Brunner stated that patient has been approved as of 07/26 until 07/25/2018.  Follow up call to [employer] patient assistance to check status of patients application for Lantus. Sheryl stated that application needs to state vials or if it is for the [employer]. Clarified drug and resubmitted application.  Will follow up with [employer] in 2-3 business days.  Maud Deed [place], [place] Management [phone]

## 2018-03-23 DIAGNOSIS — I10 Essential (primary) hypertension: Secondary | ICD-10-CM | POA: Diagnosis not present

## 2018-03-25 ENCOUNTER — Other Ambulatory Visit: Payer: Self-pay | Admitting: Pharmacy Technician

## 2018-03-25 DIAGNOSIS — N184 Chronic kidney disease, stage 4 (severe): Secondary | ICD-10-CM | POA: Diagnosis not present

## 2018-03-25 DIAGNOSIS — I129 Hypertensive chronic kidney disease with stage 1 through stage 4 chronic kidney disease, or unspecified chronic kidney disease: Secondary | ICD-10-CM | POA: Diagnosis not present

## 2018-03-25 DIAGNOSIS — Z6827 Body mass index (BMI) 27.0-27.9, adult: Secondary | ICD-10-CM | POA: Diagnosis not present

## 2018-03-25 DIAGNOSIS — E1122 Type 2 diabetes mellitus with diabetic chronic kidney disease: Secondary | ICD-10-CM | POA: Diagnosis not present

## 2018-03-25 DIAGNOSIS — I10 Essential (primary) hypertension: Secondary | ICD-10-CM | POA: Diagnosis not present

## 2018-03-25 DIAGNOSIS — R413 Other amnesia: Secondary | ICD-10-CM | POA: Diagnosis not present

## 2018-03-25 NOTE — Patient Outreach (Signed)
[facility]) Care Management  03/25/2018  Leah Walsh 17-Nov-1940 [id number]   Follow up call to [employer] patient assistance to check status of patient application for Lantus Solostar. [name] confirmed that patient had been approved as of 07/30 until 08/25/18. Drug should be shipped out on Monday 8/5 and providers office should receive it in 5-7 business days.  Will follow up with patient in 7-10 business days to confirm medication has been received.  [name], [place] Management [phone]

## 2018-03-31 ENCOUNTER — Encounter (INDEPENDENT_AMBULATORY_CARE_PROVIDER_SITE_OTHER): Payer: PPO | Admitting: Ophthalmology

## 2018-03-31 DIAGNOSIS — H353132 Nonexudative age-related macular degeneration, bilateral, intermediate dry stage: Secondary | ICD-10-CM | POA: Diagnosis not present

## 2018-03-31 DIAGNOSIS — H34831 Tributary (branch) retinal vein occlusion, right eye, with macular edema: Secondary | ICD-10-CM | POA: Diagnosis not present

## 2018-03-31 DIAGNOSIS — H43813 Vitreous degeneration, bilateral: Secondary | ICD-10-CM | POA: Diagnosis not present

## 2018-03-31 DIAGNOSIS — H35033 Hypertensive retinopathy, bilateral: Secondary | ICD-10-CM | POA: Diagnosis not present

## 2018-03-31 DIAGNOSIS — I10 Essential (primary) hypertension: Secondary | ICD-10-CM

## 2018-04-01 ENCOUNTER — Encounter: Payer: Self-pay | Admitting: *Deleted

## 2018-04-01 ENCOUNTER — Other Ambulatory Visit: Payer: Self-pay | Admitting: *Deleted

## 2018-04-01 NOTE — Patient Outreach (Signed)
[facility]) Care Management  04/01/2018  Leah Walsh 1941/05/01 [id number]   RN Health Coach Monthly Outreach  Referral Date:02/02/2018 Referral Source:HTA Concierge Reason for Referral:Medication Assistance/Chronic Care Improvement Program Insurance:Health Team Advantage   Outreach Attempt:  Outreach attempt #1 to patient for monthly follow up. No answer. RN Health Coach left HIPAA compliant voicemail message along with contact information.  Plan:  RN Health Coach will send unsuccessful outreach letter to patient.  RN Health Coach will make another outreach attempt to patient within 3-4 business days if no return call back from patient.   Leah Walsh [phone] [email or web address]

## 2018-04-02 NOTE — Patient Outreach (Signed)
[facility]) Care Management  04/02/2018  [name] 1941-01-15 [id number]   RN Health Coach Monthly Outreach  Referral Date:02/02/2018 Referral Source:HTA Concierge Reason for Referral:Medication Assistance/Chronic Care Improvement Program Insurance:Health Team Advantage   Outreach Attempt:  Received return call from patient.  HIPAA verified with patient.  Patient stating she is doing ok.  Reports her Leah Walsh continuous monitoring system was malfunctioning and had to be removed due to inaccurate readings.  States she has received a replacement device and plans to go into primary care provider's office to have nurse assist her in replacing the device.  Patient reporting she has been checking her blood sugars with regular finger sticks and have been getting more reasonable numbers.  States fasting blood sugar yesterday was 103 with fasting ranges of 80-100's.  Does report hypoglycemic episode of 46 and feeling symptomatic (lightheaded, dizziness, faint) when her sugars are in the 80's.  Patient has obtained home blood pressure machine and scale.  Reports she was monitoring her blood pressure at home about 3-4 times a day and has had an Urgent Care visit last week related to elevated blood pressures of 200's SBP.  Patient stating she was instructed by her primary care provider to monitor her blood pressure only about 3-4 times a week and her Norvasc  dose was increased.  Reports she got an injection in her eye yesterday and SBP was 178.  Patient encouraged to monitor home blood pressures at least 3-4 times a week per physician's recommendations and to notify her primary care provider of sustained elevations.  Appointments:  Patient has seen primary care provider, Dr. Helene Kelp on 03/25/2018 and has follow up appointment in October 2019 (needs to verify the date).  Has scheduled follow up with Cardiologist on 06/24/2018.  Plan: RN Health Coach will make next monthly outreach to  patient in the month of September.  [place] [phone] [name]

## 2018-04-07 ENCOUNTER — Other Ambulatory Visit: Payer: Self-pay | Admitting: Pharmacy Technician

## 2018-04-07 NOTE — Patient Outreach (Signed)
[facility]) Care Management  04/07/2018  Leah Walsh 07/23/1941 [id number]   Successful outreach call to patient, HIPAA identifiers verified. Patient states that she has received her Lantus Solostar but has not received her Novolog and has about 3 pens left. Informed her that I would follow up with Novo Nordisk to check the shipping status of medication. Also informed patient that I received the provider portion for [employer] patient assistance for Besivance and that I faxed the completed application in today.  Will follow up with [place] in 2-14 business days.  Maud Deed [place], [place] Management [phone]

## 2018-04-08 ENCOUNTER — Other Ambulatory Visit: Payer: Self-pay | Admitting: Pharmacy Technician

## 2018-04-09 NOTE — Patient Outreach (Signed)
[facility]) Care Management  04/09/2018  Jaquelin Meaney 01-22-1941 [id number]   Follow up call on 8/14 to check delivery status of patients Novolog. Spoke to [employer] who stated that [employer] is currently running behind on shipping out meds, however she expedited patient shipment and stated it should arrive at providers office by the end of week.  Successful call attempt to patient on 8/15 @ 10:54am, HIPAA identifiers verified. Patient states that she had received the call from Dr. Danella Deis office that Novolog had arrived.  Will follow up with patient in 2-3 weeks with updates on applications thru [place] and [place].  Maud Deed [place], [place] Management [phone]

## 2018-04-14 DIAGNOSIS — M1711 Unilateral primary osteoarthritis, right knee: Secondary | ICD-10-CM | POA: Diagnosis not present

## 2018-04-16 ENCOUNTER — Other Ambulatory Visit: Payer: Self-pay | Admitting: Pharmacy Technician

## 2018-04-16 NOTE — Patient Outreach (Signed)
[facility]) Care Management  04/16/2018  Shaneal Barasch October 12, 1940 [id number]   Follow up call to [place] patient assistance to check status of application for Restasis. Per automated system, patient has been denied to not meeting the requirements.  Will follow up with [facility] in 3-5 business days to check status of application for besivance.  Maud Deed [place], [place] Management [phone]

## 2018-04-22 DIAGNOSIS — N183 Chronic kidney disease, stage 3 (moderate): Secondary | ICD-10-CM | POA: Diagnosis not present

## 2018-04-22 DIAGNOSIS — N184 Chronic kidney disease, stage 4 (severe): Secondary | ICD-10-CM | POA: Diagnosis not present

## 2018-04-22 DIAGNOSIS — M9080 Osteopathy in diseases classified elsewhere, unspecified site: Secondary | ICD-10-CM | POA: Diagnosis not present

## 2018-04-22 DIAGNOSIS — E559 Vitamin D deficiency, unspecified: Secondary | ICD-10-CM | POA: Diagnosis not present

## 2018-04-22 DIAGNOSIS — E889 Metabolic disorder, unspecified: Secondary | ICD-10-CM | POA: Diagnosis not present

## 2018-04-22 DIAGNOSIS — I129 Hypertensive chronic kidney disease with stage 1 through stage 4 chronic kidney disease, or unspecified chronic kidney disease: Secondary | ICD-10-CM | POA: Diagnosis not present

## 2018-04-22 DIAGNOSIS — D631 Anemia in chronic kidney disease: Secondary | ICD-10-CM | POA: Diagnosis not present

## 2018-04-28 ENCOUNTER — Encounter (INDEPENDENT_AMBULATORY_CARE_PROVIDER_SITE_OTHER): Payer: PPO | Admitting: Ophthalmology

## 2018-04-28 DIAGNOSIS — H34831 Tributary (branch) retinal vein occlusion, right eye, with macular edema: Secondary | ICD-10-CM

## 2018-04-28 DIAGNOSIS — H35033 Hypertensive retinopathy, bilateral: Secondary | ICD-10-CM

## 2018-04-28 DIAGNOSIS — H43813 Vitreous degeneration, bilateral: Secondary | ICD-10-CM

## 2018-04-28 DIAGNOSIS — H353132 Nonexudative age-related macular degeneration, bilateral, intermediate dry stage: Secondary | ICD-10-CM

## 2018-04-28 DIAGNOSIS — I10 Essential (primary) hypertension: Secondary | ICD-10-CM | POA: Diagnosis not present

## 2018-04-29 ENCOUNTER — Encounter: Payer: Self-pay | Admitting: Internal Medicine

## 2018-04-29 ENCOUNTER — Ambulatory Visit: Payer: PPO | Admitting: Internal Medicine

## 2018-04-29 VITALS — BP 130/68 | HR 60 | Ht 67.25 in | Wt 176.2 lb

## 2018-04-29 DIAGNOSIS — G4733 Obstructive sleep apnea (adult) (pediatric): Secondary | ICD-10-CM | POA: Diagnosis not present

## 2018-04-29 DIAGNOSIS — J4530 Mild persistent asthma, uncomplicated: Secondary | ICD-10-CM

## 2018-04-29 DIAGNOSIS — Z23 Encounter for immunization: Secondary | ICD-10-CM | POA: Diagnosis not present

## 2018-04-29 MED ORDER — BECLOMETHASONE DIPROPIONATE 80 MCG/ACT IN AERS: 2.0000 | INHALATION_SPRAY | Freq: Two times a day (BID) | RESPIRATORY_TRACT | 0 refills | Status: DC

## 2018-04-29 MED ORDER — FLUTICASONE PROPIONATE HFA 110 MCG/ACT IN AERO: INHALATION_SPRAY | RESPIRATORY_TRACT | 3 refills | Status: DC

## 2018-04-29 NOTE — Progress Notes (Signed)
HPI female never smoker followed for OSA, complicated by CAD/MI/stent, asthma (Dr. Neldon Mc), DM 2, CKD4 NPSG 12/05/10([place]) AHI 14.2/ hr, weight 178 lbs.  PFT 11/07/14-  Moderate Diffusion deficit 60%. Normal flows and lung volumes with insignificant response to bronchodilator. a1AT- 10/07/14-  Nl MM 123 OK  --------------------------------------------------------------------------------------------  04/29/17- 77 year old female never smoker followed for OSA, complicated by CAD/MI/stent, asthma (Dr. Neldon Mc), DM 2 CPAP auto 5-15/[facility] FOLLOWS FOR: DME: Huey Romans. Pt wears CPAP nightly and doing well with new mask that goes under the nose. DL attached.  Pt will need order for new supplies now. GYN surgery for benign tumor in May at [facility]. By report had some atelectasis postop. She declined a CT scan with contrast because she didn't want to further worsening kidney function as a diabetic. She was off CPAP during that time but has resumed every night now. Download reflects the break in therapy while she was hospitalized.  04/29/18- 77 year old female never smoker followed for OSA, complicated by CAD/MI/stent, asthma (Dr. Neldon Mc), DM 2, CKD4 CPAP auto 5-15/[facility] -----OSA: DME: [facility]. Pt wears CPAP nightly and unable to get DL at this time-pt did not bring Sd Card. Pt is having recurrent sinus infections.  Getting eye injections after hemorrhage.  Has been treated for sinus infection.  We discussed So Clean machine for CPAP care and relatively low incidence of sinus problems with CPAP. She is comfortable with her CPAP and reports using it all night every night.  Likes nasal pillows mask.  Sleeps better with it. Followed by her Allergist for asthma.  They had given sample Qvar 80 which helped while using it.  Sample ran out and her insurance does not cover that.  Says she cannot get anything more until her November appointment so I offered Flovent 110 which is covered by her plan.  ROS see HPI    + =  positive Constitutional:   No-   weight loss, night sweats, fevers, chills, fatigue, lassitude. HEENT:   No-  headaches, difficulty swallowing, tooth/dental problems, sore throat,       No-  sneezing, itching, ear ache, + nasal congestion, post nasal drip,  CV:  No-   chest pain, orthopnea, PND, + swelling in lower extremities, anasarca,                                           dizziness, + palpitations Resp: No-   shortness of breath with exertion or at rest.              No-   productive cough,  No non-productive cough,  No- coughing up of blood.              No-   change in color of mucus.  No- wheezing.   Skin: No-   rash or lesions. GI:  No-   heartburn, + indigestion, abdominal pain, nausea, vomiting,  GU:  MS:  + joint pain or swelling.   Neuro-     nothing unusual Psych:  No- change in mood or affect. No depression or anxiety.  No memory loss.  OBJ- Physical Exam General- Alert, Oriented, Affect-appropriate, Distress- none acute Skin- rash-none, lesions- none, excoriation- none Lymphadenopathy- none Head- atraumatic            Eyes- Gross vision intact, PERRLA, conjunctivae and secretions clear  Ears- Hearing, canals-normal            Nose- Clear, no-Septal dev, mucus, polyps, erosion, perforation             Throat- Mallampati III , mucosa clear , drainage- none, tonsils- atrophic Neck- flexible , trachea midline, no stridor , thyroid nl, carotid no bruit Chest - symmetrical excursion , unlabored           Heart/CV- RRR , no murmur , no gallop  , no rub, nl s1 s2                           - JVD- none , edema- none, stasis changes- none, varices- none           Lung- clear to P&A, wheeze- none, cough- none , dullness-none, rub- none           Chest wall-  Abd-  Br/ Gen/ Rectal- Not done, not indicated Extrem- cyanosis- none, clubbing, none, atrophy- none, strength- nl. Neuro- grossly intact to observation

## 2018-04-29 NOTE — Assessment & Plan Note (Signed)
Uncomplicated at this visit.  Being managed by Dr.Kozlow. She noted benefit using Qvar 80 which her plan does not cover.  I offered Flovent 110 HFA until she can get back with Dr. Neldon Mc as scheduled this fall.

## 2018-04-29 NOTE — Patient Instructions (Addendum)
Flu vax- senior  Rx sent changing Qvar, which your insurance doesn't cover, to Flovent 110 hfa.    Inhale 2 puffs then rinse mouth well, twice daily. You can ask Dr Bruna Potter office about staying on this.  (QVAR 80 inhaler given as sample-no Flovent sample available)  Order- DME [facility] continue CPAP auto 5-15. Please replace supplies, mask of choice, humidifier, AirView  Please call if we can help

## 2018-04-29 NOTE — Assessment & Plan Note (Signed)
She continues to benefit from CPAP, reporting good compliance and better sleep.  Likes nasal pillows mask. Plan-I could not promise that a cleaning machine would reduce incidence of sinus infections, but okay to try it.

## 2018-04-30 DIAGNOSIS — I129 Hypertensive chronic kidney disease with stage 1 through stage 4 chronic kidney disease, or unspecified chronic kidney disease: Secondary | ICD-10-CM | POA: Diagnosis not present

## 2018-04-30 DIAGNOSIS — D631 Anemia in chronic kidney disease: Secondary | ICD-10-CM | POA: Diagnosis not present

## 2018-04-30 DIAGNOSIS — E559 Vitamin D deficiency, unspecified: Secondary | ICD-10-CM | POA: Diagnosis not present

## 2018-04-30 DIAGNOSIS — N184 Chronic kidney disease, stage 4 (severe): Secondary | ICD-10-CM | POA: Diagnosis not present

## 2018-04-30 DIAGNOSIS — M9080 Osteopathy in diseases classified elsewhere, unspecified site: Secondary | ICD-10-CM | POA: Diagnosis not present

## 2018-04-30 DIAGNOSIS — E889 Metabolic disorder, unspecified: Secondary | ICD-10-CM | POA: Diagnosis not present

## 2018-04-30 DIAGNOSIS — I701 Atherosclerosis of renal artery: Secondary | ICD-10-CM | POA: Diagnosis not present

## 2018-05-07 ENCOUNTER — Other Ambulatory Visit: Payer: Self-pay | Admitting: *Deleted

## 2018-05-07 ENCOUNTER — Encounter: Payer: Self-pay | Admitting: *Deleted

## 2018-05-07 NOTE — Patient Outreach (Signed)
[facility]) Care Management  [place]  05/07/2018   Leah Walsh 1941/04/05 [id number]   RN Health Coach Monthly Outreach   Referral Date:  02/02/2018 Referral Source:  HTA Concierge Reason for Referral:  Medication Assistance/Chronic Care Improvement Program Insurance:  [employer]age   Outreach Attempt:  Successful telephone outreach to patient for monthly follow up.  HIPAA verified with patient.  Patient stating she is doing well.  Reports her blood sugars have been better controlled with taking her Novolog meal coverage prior to eating.  Fasting CBG this morning was 121 with fasting ranges of 90-120's.  Reporting only one hypoglycemic episode of 60 in the past 6 weeks.  Patient stating she is receiving doses of Lantus and Novolog from the medication assistance program.  Stating her blood pressure continues to be elevated with SBP ranges of 150-160's.  States she has seen her nephrologist at the request of her primary care provider on 04/30/2018 and they plan to schedule her for a renal ultrasound.  Discussed with patient outreach calls every other month and patient verbally agrees.  Encounter Medications:  Outpatient Encounter Medications as of 05/07/2018  Medication Sig Note  . albuterol (PROVENTIL HFA) 108 (90 Base) MCG/ACT inhaler Inhale two puffs every four to six hours as needed for cough or wheeze   . allopurinol (ZYLOPRIM) 100 MG tablet Take 100 mg by mouth daily. 08/04/2014: Received from: External Pharmacy  . aspirin 81 MG tablet Take 81 mg by mouth daily.   [name] atorvastatin (LIPITOR) 80 MG tablet Take 80 mg by mouth at bedtime. 08/04/2014: Received from: External Pharmacy  . B-D UF III MINI PEN NEEDLES 31G X 5 MM MISC  08/04/2014: Received from: External Pharmacy  . beclomethasone (QVAR) 80 MCG/ACT inhaler Inhale 2 puffs into the lungs 2 (two) times daily.   . carvedilol (COREG) 12.5 MG tablet Take 12.5 mg by mouth 2 (two) times daily with a meal.   08/04/2014: Received from: External Pharmacy  . Coenzyme Q10 (COQ10) 200 MG CAPS Take 1 capsule by mouth daily.   . cycloSPORINE (RESTASIS) 0.05 % ophthalmic emulsion Place 1 drop into both eyes 2 (two) times daily.   [name] ezetimibe (ZETIA) 10 MG tablet Take 10 mg by mouth daily.   . fluticasone (FLOVENT HFA) 110 MCG/ACT inhaler Inhale 2 puffs then rinse mouth, twice daily   . furosemide (LASIX) 40 MG tablet Take 40 mg by mouth 2 (two) times daily.  04/17/2017: Taking 2 tablets QAM   . hydrALAZINE (APRESOLINE) 50 MG tablet Take 50 mg by mouth 3 (three) times daily.  03/12/2018: Reports taking 3 times a day  . LANTUS SOLOSTAR 100 UNIT/ML Solostar Pen 40 Units 2 (two) times daily.  08/04/2014: Received from: External Pharmacy  . losartan (COZAAR) 50 MG tablet Take 50 mg by mouth daily.  04/17/2017: Taking BID  . montelukast (SINGULAIR) 10 MG tablet Take 1 tablet (10 mg total) by mouth daily.   . Multiple Vitamin (MULTIVITAMIN) tablet Take 1 tablet by mouth daily.   . Multiple Vitamins-Minerals (PRESERVISION AREDS 2 PO) Take by mouth.   [name] NIFEdipine (PROCARDIA-XL/ADALAT CC) 60 MG 24 hr tablet Take 60 mg by mouth 2 (two) times daily.   [name] NITROSTAT 0.4 MG SL tablet Place 0.4 mg under the tongue every 5 (five) minutes as needed.  08/04/2014: Received from: External Pharmacy  . NOVOLOG FLEXPEN 100 UNIT/ML FlexPen 16 Units 3 (three) times daily with meals.    . Omega-3 Fatty Acids (FISH  OIL PO) Take 1 capsule by mouth daily.    [name] omeprazole (PRILOSEC) 40 MG capsule Take 40 mg by mouth daily.    [name] amLODipine (NORVASC) 10 MG tablet Take 10 mg by mouth daily. 05/07/2018: Reports nephrologist stopped this medication  . Cholecalciferol (VITAMIN D) 2000 UNITS CAPS Take 1 capsule by mouth daily. 03/12/2018: Reports not taking currently   No facility-administered encounter medications on file as of 05/07/2018.     Functional Status:  In your present state of health, do you have any difficulty performing the  following activities: 03/12/2018  Hearing? N  Vision? Y  Comment Macular degeneration  Difficulty concentrating or making decisions? N  Walking or climbing stairs? Y  Comment knee pain after falls  Dressing or bathing? N  Doing errands, shopping? N  Preparing Food and eating ? N  Using the Toilet? N  In the past six months, have you accidently leaked urine? N  Do you have problems with loss of bowel control? N  Managing your Medications? N  Managing your Finances? N  Housekeeping or managing your Housekeeping? N  Some recent data might be hidden    Fall/Depression Screening: Fall Risk  03/12/2018  Falls in the past year? Yes  Comment fall x2 in December  Number falls in past yr: 2 or more  Injury with Fall? No  Risk Factor Category  High Fall Risk  Risk for fall due to : History of fall(s);Medication side effect;Impaired mobility;Impaired vision  Follow up Falls prevention discussed;Education provided;Falls evaluation completed   PHQ 2/9 Scores 03/12/2018 01/30/2018  PHQ - 2 Score 0 0    THN CM Care Plan Problem One     Most Recent Value  Care Plan Problem One  Financial deficiets related to diabetes management  Role Documenting the Problem One  [place] for Problem One  Active  THN Long Term Goal   Patient will reduce her Hgb A1C by 0.5 points in the next 90 days.  THN Long Term Goal Start Date  05/07/18  Interventions for Problem One Long Term Goal  Current care plan and goals reviewed and discussed with patient, encouraged to keep and attend medical appointments, reviewed medications and encouraged compliance, encouraged continued Novolog injections prior to meals as instructed by her physician, discussed and encouraged healthier meal and drink options  THN CM Short Term Goal #1   Patient will report having renal ultrasound completed in the next 60 days.  THN CM Short Term Goal #1 Start Date  05/07/18  [facility] CM Short Term Goal #1 Met Date  05/07/18   Interventions for Short Term Goal #1  Reviewed home blood pressure readings, encouraged patient to monitor blood pressures 2 hours after taking morning medications as instructed by her nephrologist, discussed how renal disease can affect hypertension and reason for renal ultrasound, reviewed medications and new medication adjustments and encouraged medication compliance  THN CM Short Term Goal #2   Patient will report a decrease in the number of hypoglycemic events in the next 30 days.  THN CM Short Term Goal #2 Start Date  04/01/18  [facility] CM Short Term Goal #2 Met Date  05/07/18     Appointments:  Reporting last seeing primary care provider on 03/25/2018 and thinks follow up appointment is in November but needs to verify.  Encouraged patient to verify appointment.    Plan: RN Health Coach will send primary care provider quarterly update. RN Health Coach will make next outreach attempt  in the month of November.  [place] [phone] [email or web address]

## 2018-05-12 DIAGNOSIS — E1122 Type 2 diabetes mellitus with diabetic chronic kidney disease: Secondary | ICD-10-CM | POA: Diagnosis not present

## 2018-05-14 DIAGNOSIS — I129 Hypertensive chronic kidney disease with stage 1 through stage 4 chronic kidney disease, or unspecified chronic kidney disease: Secondary | ICD-10-CM | POA: Diagnosis not present

## 2018-05-14 DIAGNOSIS — N184 Chronic kidney disease, stage 4 (severe): Secondary | ICD-10-CM | POA: Diagnosis not present

## 2018-05-20 ENCOUNTER — Other Ambulatory Visit: Payer: Self-pay | Admitting: Pharmacist

## 2018-05-20 NOTE — Patient Outreach (Signed)
[place] [facility]) Care Management  05/20/2018  Leah Walsh 02/03/1941 [id number]  77 y.o. year old female referred to [place] for medication assistance.  Care coordination call to [facility] for update on Besivance application.  Per representative, patient denied based on having a co-pay via insurance.  Patient may appeal by submitting letter of financial hardship and copy of amount of co-pay from pharmacy.   Successful telephone call to Ms. Leah Walsh this morning.  I updated patient regarding denial of Besivance.  Patient voiced understanding and does not wish to appeal as she is now in the catastrophic phase of her insurance and all co-pays are very low.  We reviewed requirements for insulin patient assistance programs for 2020 as patient receiving Lantus and Novolog via PAPs currently.  Patient voiced understanding.  No further medication concerns at this time.   I will close [facility] pharmacy case as goals of care have been met.  I provided patient with my contact information if she needs to reach out to me in the future.   Thank you for allowing [facility] pharmacy to be involved in this patient's care.     Ralene Bathe, PharmD, [place] [phone]

## 2018-05-25 ENCOUNTER — Encounter (INDEPENDENT_AMBULATORY_CARE_PROVIDER_SITE_OTHER): Payer: PPO | Admitting: Ophthalmology

## 2018-05-25 DIAGNOSIS — H353132 Nonexudative age-related macular degeneration, bilateral, intermediate dry stage: Secondary | ICD-10-CM | POA: Diagnosis not present

## 2018-05-25 DIAGNOSIS — I10 Essential (primary) hypertension: Secondary | ICD-10-CM

## 2018-05-25 DIAGNOSIS — H43813 Vitreous degeneration, bilateral: Secondary | ICD-10-CM | POA: Diagnosis not present

## 2018-05-25 DIAGNOSIS — H35033 Hypertensive retinopathy, bilateral: Secondary | ICD-10-CM | POA: Diagnosis not present

## 2018-05-25 DIAGNOSIS — E1122 Type 2 diabetes mellitus with diabetic chronic kidney disease: Secondary | ICD-10-CM | POA: Diagnosis not present

## 2018-05-25 DIAGNOSIS — H34831 Tributary (branch) retinal vein occlusion, right eye, with macular edema: Secondary | ICD-10-CM | POA: Diagnosis not present

## 2018-05-25 DIAGNOSIS — E782 Mixed hyperlipidemia: Secondary | ICD-10-CM | POA: Diagnosis not present

## 2018-05-25 DIAGNOSIS — K219 Gastro-esophageal reflux disease without esophagitis: Secondary | ICD-10-CM | POA: Diagnosis not present

## 2018-06-11 ENCOUNTER — Ambulatory Visit: Payer: PPO | Admitting: Allergy and Immunology

## 2018-06-11 ENCOUNTER — Encounter: Payer: Self-pay | Admitting: Allergy and Immunology

## 2018-06-11 VITALS — BP 176/62 | HR 68 | Resp 18

## 2018-06-11 DIAGNOSIS — G4733 Obstructive sleep apnea (adult) (pediatric): Secondary | ICD-10-CM | POA: Diagnosis not present

## 2018-06-11 DIAGNOSIS — J4530 Mild persistent asthma, uncomplicated: Secondary | ICD-10-CM

## 2018-06-11 DIAGNOSIS — J3089 Other allergic rhinitis: Secondary | ICD-10-CM | POA: Diagnosis not present

## 2018-06-11 NOTE — Progress Notes (Signed)
Follow-up Note  Referring Provider: [name], MD Primary Provider: [name], MD Date of Office Visit: 06/11/2018  Subjective:   Leah Walsh (DOB: 1940-09-21) is a 77 y.o. female who returns to the [facility] [place] on 06/11/2018 in re-evaluation of the following:  HPI: Deshanna presents to this clinic in evaluation of asthma and allergic rhinitis and obstructive sleep apnea.  I have not seen her in this clinic since 10 December 2017.  She has really done well with her asthma and has not required a systemic steroid to treat an exacerbation and rarely uses a short acting bronchodilator while continuing to use a low-dose inhaled steroid and a leukotriene modifier.  Likewise, while using a combination of Afrin and Nasacort she has done very well with her upper airway and is able to use her CPAP machine with no difficulty at all.  She did receive the flu vaccine this year.  Allergies as of 06/11/2018      Reactions   Compazine [prochlorperazine Edisylate] Other (See Comments)   Stroke like symptoms   Colcrys [colchicine] Diarrhea   Hydrocodone    Codeine Nausea And Vomiting      Medication List      albuterol 108 (90 Base) MCG/ACT inhaler Commonly known as:  PROVENTIL HFA;VENTOLIN HFA Inhale two puffs every four to six hours as needed for cough or wheeze   allopurinol 100 MG tablet Commonly known as:  ZYLOPRIM Take 100 mg by mouth daily.   amLODipine 10 MG tablet Commonly known as:  NORVASC Take 10 mg by mouth daily.   aspirin 81 MG tablet Take 81 mg by mouth daily.   atorvastatin 80 MG tablet Commonly known as:  LIPITOR Take 80 mg by mouth at bedtime.   B-D UF III MINI PEN NEEDLES 31G X 5 MM Misc Generic drug:  Insulin Pen Needle   beclomethasone 80 MCG/ACT inhaler Commonly known as:  QVAR Inhale 2 puffs into the lungs 2 (two) times daily.   carvedilol 12.5 MG tablet Commonly known as:  COREG Take 12.5 mg by mouth 2 (two) times daily with a  meal.   CoQ10 200 MG Caps Take 1 capsule by mouth daily.   cycloSPORINE 0.05 % ophthalmic emulsion Commonly known as:  RESTASIS Place 1 drop into both eyes 2 (two) times daily.   ezetimibe 10 MG tablet Commonly known as:  ZETIA Take 10 mg by mouth daily.   FISH OIL PO Take 1 capsule by mouth daily.   fluticasone 110 MCG/ACT inhaler Commonly known as:  FLOVENT HFA Inhale 2 puffs then rinse mouth, twice daily   furosemide 40 MG tablet Commonly known as:  LASIX Take 40 mg by mouth 2 (two) times daily.   hydrALAZINE 50 MG tablet Commonly known as:  APRESOLINE Take 50 mg by mouth 3 (three) times daily.   LANTUS SOLOSTAR 100 UNIT/ML Solostar Pen Generic drug:  Insulin Glargine 40 Units 2 (two) times daily.   losartan 50 MG tablet Commonly known as:  COZAAR Take 50 mg by mouth daily.   montelukast 10 MG tablet Commonly known as:  SINGULAIR Take 1 tablet (10 mg total) by mouth daily.   multivitamin tablet Take 1 tablet by mouth daily.   NIFEdipine 60 MG 24 hr tablet Commonly known as:  ADALAT CC Take 60 mg by mouth 2 (two) times daily.   NITROSTAT 0.4 MG SL tablet Generic drug:  nitroGLYCERIN Place 0.4 mg under the tongue every 5 (five) minutes  as needed.   NOVOLOG FLEXPEN 100 UNIT/ML FlexPen Generic drug:  insulin aspart 16 Units 3 (three) times daily with meals.   omeprazole 40 MG capsule Commonly known as:  PRILOSEC Take 40 mg by mouth daily.   PRESERVISION AREDS 2 PO Take by mouth.   Vitamin D 2000 units Caps Take 1 capsule by mouth daily.       Past Medical History:  Diagnosis Date  . Asthma   . Diabetes ([place])   . Heart attack ([place]) 2014  . Hyperlipidemia   . Hypertension   . Kidney disease   . Sleep apnea    Using CPAP    Past Surgical History:  Procedure Laterality Date  . ABDOMINAL HYSTERECTOMY    . CAROTID STENT    . CHOLECYSTECTOMY    . TUBAL LIGATION    . 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 VESICOVAGINAL FISTULA CLOSURE  W/ TAH      Review of systems negative except as noted in HPI / PMHx or noted below:  Review of Systems  Constitutional: Negative.   HENT: Negative.   Eyes: Negative.   Respiratory: Negative.   Cardiovascular: Negative.   Gastrointestinal: Negative.   Genitourinary: Negative.   Musculoskeletal: Negative.   Skin: Negative.   Neurological: Negative.   Endo/Heme/Allergies: Negative.   Psychiatric/Behavioral: Negative.      Objective:   Vitals:   06/11/18 1546  BP: (!) 176/62  Pulse: 68  Resp: 18  SpO2: 97%          Physical Exam  HENT:  Head: Normocephalic.  Right Ear: Tympanic membrane, external ear and ear canal normal.  Left Ear: Tympanic membrane, external ear and ear canal normal.  Nose: Nose normal. No mucosal edema or rhinorrhea.  Mouth/Throat: Uvula is midline, oropharynx is clear and moist and mucous membranes are normal. No oropharyngeal exudate.  Eyes: Conjunctivae are normal.  Neck: Trachea normal. No tracheal tenderness present. No tracheal deviation present. No thyromegaly present.  Cardiovascular: Normal rate, regular rhythm, S1 normal, S2 normal and normal heart sounds.  No murmur heard. Pulmonary/Chest: Breath sounds normal. No stridor. No respiratory distress. She has no wheezes. She has no rales.  Musculoskeletal: She exhibits no edema.  Lymphadenopathy:       Head (right side): No tonsillar adenopathy present.       Head (left side): No tonsillar adenopathy present.    She has no cervical adenopathy.  Neurological: She is alert.  Skin: No rash noted. She is not diaphoretic. No erythema. Nails show no clubbing.    Diagnostics:    Spirometry was performed and demonstrated an FEV1 of 1.78 at 90 % of predicted.  The patient had an Asthma Control Test with the following results:  .    Assessment and Plan:   1. Asthma, well controlled, mild persistent   2. Other allergic rhinitis   3. Obstructive sleep apnea     1.  Continue QVAR 80  REDIHALER 2 inhalations ONE time per day   2. Continue Montelukast 10mg  one tablet one time per day  3.  Continue a combination of Afrin - one spray each nostril + OTC Nasacort - one spray each nostril in evening.  4. If Needed:    A. Proventil HFA 2 puffs every 4-6 hours if needed  B. Nasal saline spray  C. OTC Loratadine 10mg  one tablet one time   5. Return to clinic in 6 months or sooner if needed  [place] has  really done very well on her current plan and she will continue to use a low dose of inhaled steroid and leukotriene modifier and prior to placing her CPAP machine every night she will use a spray of Afrin and Nasacort.  Assuming this plan continues to work well I will see her back in this clinic in 6 months or earlier if there is a problem.  Allena Katz, MD Allergy / Immunology [place]

## 2018-06-11 NOTE — Patient Instructions (Addendum)
  1.  Continue QVAR 80 REDIHALER 2 inhalations ONE time per day   2. Continue Montelukast 10mg  one tablet one time per day  3.  Continue a combination of Afrin - one spray each nostril + OTC Nasacort - one spray each nostril in evening.  4. If Needed:    A. Proventil HFA 2 puffs every 4-6 hours if needed  B. Nasal saline spray  C. OTC Loratadine 10mg  one tablet one time   5. Return to clinic in 6 months or sooner if needed

## 2018-06-15 ENCOUNTER — Encounter: Payer: Self-pay | Admitting: Allergy and Immunology

## 2018-06-15 DIAGNOSIS — N184 Chronic kidney disease, stage 4 (severe): Secondary | ICD-10-CM | POA: Diagnosis not present

## 2018-06-15 DIAGNOSIS — I129 Hypertensive chronic kidney disease with stage 1 through stage 4 chronic kidney disease, or unspecified chronic kidney disease: Secondary | ICD-10-CM | POA: Diagnosis not present

## 2018-06-18 DIAGNOSIS — D509 Iron deficiency anemia, unspecified: Secondary | ICD-10-CM

## 2018-06-18 DIAGNOSIS — D631 Anemia in chronic kidney disease: Secondary | ICD-10-CM | POA: Diagnosis not present

## 2018-06-18 DIAGNOSIS — N184 Chronic kidney disease, stage 4 (severe): Secondary | ICD-10-CM | POA: Diagnosis not present

## 2018-06-18 DIAGNOSIS — I701 Atherosclerosis of renal artery: Secondary | ICD-10-CM | POA: Diagnosis not present

## 2018-06-18 DIAGNOSIS — I129 Hypertensive chronic kidney disease with stage 1 through stage 4 chronic kidney disease, or unspecified chronic kidney disease: Secondary | ICD-10-CM | POA: Diagnosis not present

## 2018-06-18 HISTORY — DX: Iron deficiency anemia, unspecified: D50.9

## 2018-06-22 ENCOUNTER — Encounter (INDEPENDENT_AMBULATORY_CARE_PROVIDER_SITE_OTHER): Payer: PPO | Admitting: Ophthalmology

## 2018-06-22 DIAGNOSIS — H353132 Nonexudative age-related macular degeneration, bilateral, intermediate dry stage: Secondary | ICD-10-CM | POA: Diagnosis not present

## 2018-06-22 DIAGNOSIS — H43813 Vitreous degeneration, bilateral: Secondary | ICD-10-CM

## 2018-06-22 DIAGNOSIS — H35033 Hypertensive retinopathy, bilateral: Secondary | ICD-10-CM

## 2018-06-22 DIAGNOSIS — H34831 Tributary (branch) retinal vein occlusion, right eye, with macular edema: Secondary | ICD-10-CM

## 2018-06-22 DIAGNOSIS — I10 Essential (primary) hypertension: Secondary | ICD-10-CM

## 2018-06-25 DIAGNOSIS — E785 Hyperlipidemia, unspecified: Secondary | ICD-10-CM | POA: Diagnosis not present

## 2018-06-25 DIAGNOSIS — E1122 Type 2 diabetes mellitus with diabetic chronic kidney disease: Secondary | ICD-10-CM | POA: Diagnosis not present

## 2018-07-07 ENCOUNTER — Other Ambulatory Visit: Payer: Self-pay | Admitting: Pharmacist

## 2018-07-07 ENCOUNTER — Encounter: Payer: Self-pay | Admitting: *Deleted

## 2018-07-07 ENCOUNTER — Other Ambulatory Visit: Payer: Self-pay | Admitting: *Deleted

## 2018-07-07 NOTE — Patient Outreach (Signed)
[place] [facility] Care Management  [place]  07/07/2018   Leah Walsh Dec 03, 1940 [id number]   RN Health Coach Quarterly Outreach   Referral Date:  02/02/2018 Referral Source:  HTA Concierge Reason for Referral:  Medication Assistance/Chronic Care Improvement Program Insurance:  [employer] Team Advantage   Outreach Attempt:  Successful telephone outreach to patient for quarterly follow up.   HIPAA verified with patient.  Patient reporting she has been sick with a cold, sinus congestion for the past 3 weeks.  Reports no fever with a hard nonproductive cough.  Patient encouraged to schedule appointment with her primary care provider for treatment.  Reports latest Hgb A1C was 7.8 on 05/12/2018.  Fasting blood sugar this morning was 95 with recent ranges of 80-100's.  Reporting a few hypoglycemic episodes in the past few weeks.  Also states since she has been sick, she has had to use her rescue inhaler about twice.  Does endorse increasing shortness of breath and inability to sleep with her CPAP due to sinus congestion.  Again encouraged patient to seek assistance from her primary medical physician.  Encounter Medications:  Outpatient Encounter Medications as of 07/07/2018  Medication Sig Note  . albuterol (PROVENTIL HFA) 108 (90 Base) MCG/ACT inhaler Inhale two puffs every four to six hours as needed for cough or wheeze   . allopurinol (ZYLOPRIM) 100 MG tablet Take 100 mg by mouth daily. 08/04/2014: Received from: External Pharmacy  . aspirin 81 MG tablet Take 81 mg by mouth daily.   [name] atorvastatin (LIPITOR) 80 MG tablet Take 80 mg by mouth at bedtime. 08/04/2014: Received from: External Pharmacy  . B-D UF III MINI PEN NEEDLES 31G X 5 MM MISC  08/04/2014: Received from: External Pharmacy  . beclomethasone (QVAR) 80 MCG/ACT inhaler Inhale 2 puffs into the lungs 2 (two) times daily.   . carvedilol (COREG) 12.5 MG tablet Take 12.5 mg by mouth 2 (two) times daily with a meal.   08/04/2014: Received from: External Pharmacy  . Cholecalciferol (VITAMIN D) 2000 UNITS CAPS Take 1 capsule by mouth daily. 03/12/2018: Reports not taking currently  . Coenzyme Q10 (COQ10) 200 MG CAPS Take 1 capsule by mouth daily.   . cycloSPORINE (RESTASIS) 0.05 % ophthalmic emulsion Place 1 drop into both eyes 2 (two) times daily.   [name] ezetimibe (ZETIA) 10 MG tablet Take 10 mg by mouth daily.   . fluticasone (FLOVENT HFA) 110 MCG/ACT inhaler Inhale 2 puffs then rinse mouth, twice daily   . furosemide (LASIX) 40 MG tablet Take 40 mg by mouth 2 (two) times daily.  04/17/2017: Taking 2 tablets QAM   . hydrALAZINE (APRESOLINE) 50 MG tablet Take 50 mg by mouth 3 (three) times daily.  03/12/2018: Reports taking 3 times a day  . LANTUS SOLOSTAR 100 UNIT/ML Solostar Pen 40 Units 2 (two) times daily.  08/04/2014: Received from: External Pharmacy  . losartan (COZAAR) 50 MG tablet Take 50 mg by mouth daily.  04/17/2017: Taking BID  . montelukast (SINGULAIR) 10 MG tablet Take 1 tablet (10 mg total) by mouth daily.   . Multiple Vitamin (MULTIVITAMIN) tablet Take 1 tablet by mouth daily.   . Multiple Vitamins-Minerals (PRESERVISION AREDS 2 PO) Take by mouth.   [name] NIFEdipine (PROCARDIA-XL/ADALAT CC) 60 MG 24 hr tablet Take 60 mg by mouth 2 (two) times daily.   [name] NITROSTAT 0.4 MG SL tablet Place 0.4 mg under the tongue every 5 (five) minutes as needed.  08/04/2014: Received from: External Pharmacy  . NOVOLOG  FLEXPEN 100 UNIT/ML FlexPen 16 Units 3 (three) times daily with meals.    . Omega-3 Fatty Acids (FISH OIL PO) Take 1 capsule by mouth daily.    [name] omeprazole (PRILOSEC) 40 MG capsule Take 40 mg by mouth daily.    [name] amLODipine (NORVASC) 10 MG tablet Take 10 mg by mouth daily. 05/07/2018: Reports nephrologist stopped this medication   No facility-administered encounter medications on file as of 07/07/2018.     Functional Status:  In your present state of health, do you have any difficulty performing the  following activities: 03/12/2018  Hearing? N  Vision? Y  Comment Macular degeneration  Difficulty concentrating or making decisions? N  Walking or climbing stairs? Y  Comment knee pain after falls  Dressing or bathing? N  Doing errands, shopping? N  Preparing Food and eating ? N  Using the Toilet? N  In the past six months, have you accidently leaked urine? N  Do you have problems with loss of bowel control? N  Managing your Medications? N  Managing your Finances? N  Housekeeping or managing your Housekeeping? N  Some recent data might be hidden    Fall/Depression Screening: Fall Risk  07/07/2018 03/12/2018  Falls in the past year? 1 Yes  Comment No falls since last December 2018 fall x2 in December  Number falls in past yr: 1 2 or more  Injury with Fall? 0 No  Risk Factor Category  Moderate Risk (1 Point) High Fall Risk  Risk for fall due to : History of fall(s);Impaired vision;Medication side effect;Impaired balance/gait;Impaired mobility History of fall(s);Medication side effect;Impaired mobility;Impaired vision  Follow up Falls prevention discussed;Education provided Falls prevention discussed;Education provided;Falls evaluation completed   PHQ 2/9 Scores 03/12/2018 01/30/2018  PHQ - 2 Score 0 0    THN CM Care Plan Problem One     Most Recent Value  Care Plan Problem One  Financial deficiets related to diabetes management  Role Documenting the Problem One  [place] for Problem One  Active  THN Long Term Goal   Patient will reduce her Hgb A1C by 0.2 points in the next 90 days. (7.8)  THN Long Term Goal Start Date  07/07/18  [facility] Long Term Goal Met Date  07/07/18  Interventions for Problem One Long Term Goal  Current care plan and goals reviewed and discussed with patient, encouraged to keep and attend scheduled medical appointments, congratulated patient on reduction of her Hgb A1C, encouraged continued healthier food and drink options, reviewed signs and symptoms  of hypo and hyperglycemia, encouraged patient to eat meals throughout the day with reduced appetite while feeling sick to help prevent hypoglycemic episodes  THN CM Short Term Goal #1   Patient will have no hospitalizations or emergency room visits in the next 90 days.  THN CM Short Term Goal #1 Start Date  07/07/18  [facility] CM Short Term Goal #1 Met Date  07/07/18  Interventions for Short Term Goal #1  Encouraged patient to schedule appointment with primary care provider for assistance with cold and sinus congestion for the past 3 weeks, reviewed medications and encouraged compliance, reviewed signs and symptoms of infection,      Appointments:  Reports attending appointment with Dr. Helene Kelp, primary care provider on 05/25/2018 and has follow up scheduled for December 2019.  Plan: RN Health Coach will send primary care provider Quarterly Update. RN Health Coach will request [facility] Pharmacist contact patient to follow up with protocol of insulin assistance end  date and further instructions. RN Health Coach will make next telephone outreach to patient in the month of February.  [place] [phone] [email or web address]

## 2018-07-07 NOTE — Patient Outreach (Addendum)
[place] [facility]) Care Management  [place] 07/07/2018  Leah Walsh Feb 07, 1941 [id number]  Reason for call: [facility] pharmacy previously assisted patient with receiving patient assistance for Lantus and Novolog.  Patient spoke with [facility] RN and has further questions about refills and requirements for PAPs in 2020.   Outreach: Unsuccessful telephone call attempt #1 to patient. HIPAA compliant voicemail left requesting a return call  Plan:  I will make another outreach attempt to patient within 3-4 business days   Ralene Bathe, PharmD, [place] [phone]

## 2018-07-09 ENCOUNTER — Ambulatory Visit: Payer: Self-pay | Admitting: Pharmacist

## 2018-07-09 ENCOUNTER — Other Ambulatory Visit: Payer: Self-pay | Admitting: Pharmacist

## 2018-07-09 NOTE — Patient Outreach (Signed)
[place] [facility]) Care Management  [place] 07/10/2018  Mersades Barbaro [birth year] [id number]  Reason for call: [facility] pharmacy previously assisted patient with receiving patient assistance for Lantus and Novolog.  Patient spoke with [facility] RN and has further questions about refills and requirements for PAPs in 2020.   Outreach: Successful call to Ms. Fleig today.  Patient states she will run out of her Novolog in the next few weeks and is unsure of how to order refills.  I recommended that she call the [place] PAP phone number to request a refill and provided phone number.  Patient also inquired about when to apply for 2020.  I reviewed requirements for PAPs including specific TROOP for both [employer] (Lantus) and [employer]).  Patient will need to obtain medications via local pharmacy until [place] met before applying.  Patient stated she has had all medication [facility]" and all medication is "free."  She wants to know if she can use [facility]" for her insulins as well.  I referred to her to call this pharmacy and ask as I am not aware of any pharmacy with ability to pay these costs upfront for patients.  Patient will call and also will request new RXs for insulins be sent to [facility] if needed to be able to obtain them in 2020.  I provided my contact information if patient needs to reach out to me again with questions or when she is near [place] for 2020.    Plan: [facility] pharmacy case is being closed due to the following reasons:  Goals have been met.   Thank you for allowing [facility] pharmacy to be involved in this patient's care.    Ralene Bathe, PharmD, [place] [phone]

## 2018-07-13 DIAGNOSIS — I252 Old myocardial infarction: Secondary | ICD-10-CM | POA: Diagnosis not present

## 2018-07-13 DIAGNOSIS — I2510 Atherosclerotic heart disease of native coronary artery without angina pectoris: Secondary | ICD-10-CM | POA: Diagnosis not present

## 2018-07-13 DIAGNOSIS — I701 Atherosclerosis of renal artery: Secondary | ICD-10-CM | POA: Diagnosis not present

## 2018-07-20 ENCOUNTER — Encounter (INDEPENDENT_AMBULATORY_CARE_PROVIDER_SITE_OTHER): Payer: PPO | Admitting: Ophthalmology

## 2018-07-20 DIAGNOSIS — I10 Essential (primary) hypertension: Secondary | ICD-10-CM

## 2018-07-20 DIAGNOSIS — H353112 Nonexudative age-related macular degeneration, right eye, intermediate dry stage: Secondary | ICD-10-CM

## 2018-07-20 DIAGNOSIS — H34831 Tributary (branch) retinal vein occlusion, right eye, with macular edema: Secondary | ICD-10-CM

## 2018-07-20 DIAGNOSIS — H353221 Exudative age-related macular degeneration, left eye, with active choroidal neovascularization: Secondary | ICD-10-CM | POA: Diagnosis not present

## 2018-07-20 DIAGNOSIS — H35033 Hypertensive retinopathy, bilateral: Secondary | ICD-10-CM

## 2018-07-20 DIAGNOSIS — H43813 Vitreous degeneration, bilateral: Secondary | ICD-10-CM | POA: Diagnosis not present

## 2018-07-25 DIAGNOSIS — E1122 Type 2 diabetes mellitus with diabetic chronic kidney disease: Secondary | ICD-10-CM | POA: Diagnosis not present

## 2018-07-25 DIAGNOSIS — I10 Essential (primary) hypertension: Secondary | ICD-10-CM | POA: Diagnosis not present

## 2018-07-29 DIAGNOSIS — E559 Vitamin D deficiency, unspecified: Secondary | ICD-10-CM | POA: Diagnosis not present

## 2018-07-29 DIAGNOSIS — E780 Pure hypercholesterolemia: Secondary | ICD-10-CM | POA: Diagnosis not present

## 2018-07-29 DIAGNOSIS — E785 Hyperlipidemia, unspecified: Secondary | ICD-10-CM | POA: Diagnosis not present

## 2018-07-29 DIAGNOSIS — E119 Type 2 diabetes mellitus without complications: Secondary | ICD-10-CM | POA: Diagnosis not present

## 2018-08-10 DIAGNOSIS — F0631 Mood disorder due to known physiological condition with depressive features: Secondary | ICD-10-CM | POA: Diagnosis not present

## 2018-08-10 DIAGNOSIS — E1165 Type 2 diabetes mellitus with hyperglycemia: Secondary | ICD-10-CM | POA: Diagnosis not present

## 2018-08-10 DIAGNOSIS — Z0000 Encounter for general adult medical examination without abnormal findings: Secondary | ICD-10-CM | POA: Diagnosis not present

## 2018-08-10 DIAGNOSIS — Z6828 Body mass index (BMI) 28.0-28.9, adult: Secondary | ICD-10-CM | POA: Diagnosis not present

## 2018-08-18 ENCOUNTER — Ambulatory Visit: Payer: Self-pay | Admitting: Allergy

## 2018-08-24 DIAGNOSIS — Z79899 Other long term (current) drug therapy: Secondary | ICD-10-CM | POA: Diagnosis not present

## 2018-08-24 DIAGNOSIS — D631 Anemia in chronic kidney disease: Secondary | ICD-10-CM | POA: Diagnosis not present

## 2018-08-24 DIAGNOSIS — I129 Hypertensive chronic kidney disease with stage 1 through stage 4 chronic kidney disease, or unspecified chronic kidney disease: Secondary | ICD-10-CM | POA: Diagnosis not present

## 2018-08-24 DIAGNOSIS — N184 Chronic kidney disease, stage 4 (severe): Secondary | ICD-10-CM | POA: Diagnosis not present

## 2018-08-25 DIAGNOSIS — E559 Vitamin D deficiency, unspecified: Secondary | ICD-10-CM | POA: Diagnosis not present

## 2018-08-25 DIAGNOSIS — I10 Essential (primary) hypertension: Secondary | ICD-10-CM | POA: Diagnosis not present

## 2018-08-25 DIAGNOSIS — E785 Hyperlipidemia, unspecified: Secondary | ICD-10-CM | POA: Diagnosis not present

## 2018-08-27 DIAGNOSIS — I701 Atherosclerosis of renal artery: Secondary | ICD-10-CM | POA: Diagnosis not present

## 2018-08-27 DIAGNOSIS — N184 Chronic kidney disease, stage 4 (severe): Secondary | ICD-10-CM | POA: Diagnosis not present

## 2018-08-27 DIAGNOSIS — E889 Metabolic disorder, unspecified: Secondary | ICD-10-CM | POA: Diagnosis not present

## 2018-08-27 DIAGNOSIS — D631 Anemia in chronic kidney disease: Secondary | ICD-10-CM | POA: Diagnosis not present

## 2018-08-27 DIAGNOSIS — E559 Vitamin D deficiency, unspecified: Secondary | ICD-10-CM | POA: Diagnosis not present

## 2018-08-27 DIAGNOSIS — M9080 Osteopathy in diseases classified elsewhere, unspecified site: Secondary | ICD-10-CM | POA: Diagnosis not present

## 2018-08-27 DIAGNOSIS — I129 Hypertensive chronic kidney disease with stage 1 through stage 4 chronic kidney disease, or unspecified chronic kidney disease: Secondary | ICD-10-CM | POA: Diagnosis not present

## 2018-08-31 ENCOUNTER — Encounter (INDEPENDENT_AMBULATORY_CARE_PROVIDER_SITE_OTHER): Payer: PPO | Admitting: Ophthalmology

## 2018-08-31 DIAGNOSIS — H353221 Exudative age-related macular degeneration, left eye, with active choroidal neovascularization: Secondary | ICD-10-CM | POA: Diagnosis not present

## 2018-08-31 DIAGNOSIS — H35033 Hypertensive retinopathy, bilateral: Secondary | ICD-10-CM | POA: Diagnosis not present

## 2018-08-31 DIAGNOSIS — H34831 Tributary (branch) retinal vein occlusion, right eye, with macular edema: Secondary | ICD-10-CM

## 2018-08-31 DIAGNOSIS — H353112 Nonexudative age-related macular degeneration, right eye, intermediate dry stage: Secondary | ICD-10-CM

## 2018-08-31 DIAGNOSIS — I10 Essential (primary) hypertension: Secondary | ICD-10-CM | POA: Diagnosis not present

## 2018-08-31 DIAGNOSIS — H43813 Vitreous degeneration, bilateral: Secondary | ICD-10-CM

## 2018-09-24 DIAGNOSIS — E785 Hyperlipidemia, unspecified: Secondary | ICD-10-CM | POA: Diagnosis not present

## 2018-09-24 DIAGNOSIS — I10 Essential (primary) hypertension: Secondary | ICD-10-CM | POA: Diagnosis not present

## 2018-09-24 DIAGNOSIS — E1165 Type 2 diabetes mellitus with hyperglycemia: Secondary | ICD-10-CM | POA: Diagnosis not present

## 2018-09-28 DIAGNOSIS — Z90710 Acquired absence of both cervix and uterus: Secondary | ICD-10-CM | POA: Diagnosis not present

## 2018-09-28 DIAGNOSIS — N907 Vulvar cyst: Secondary | ICD-10-CM | POA: Diagnosis not present

## 2018-09-28 DIAGNOSIS — Z1239 Encounter for other screening for malignant neoplasm of breast: Secondary | ICD-10-CM | POA: Diagnosis not present

## 2018-09-28 DIAGNOSIS — Z01419 Encounter for gynecological examination (general) (routine) without abnormal findings: Secondary | ICD-10-CM | POA: Diagnosis not present

## 2018-10-05 ENCOUNTER — Encounter (INDEPENDENT_AMBULATORY_CARE_PROVIDER_SITE_OTHER): Payer: PPO | Admitting: Ophthalmology

## 2018-10-05 DIAGNOSIS — H353132 Nonexudative age-related macular degeneration, bilateral, intermediate dry stage: Secondary | ICD-10-CM

## 2018-10-05 DIAGNOSIS — H34831 Tributary (branch) retinal vein occlusion, right eye, with macular edema: Secondary | ICD-10-CM

## 2018-10-05 DIAGNOSIS — H35033 Hypertensive retinopathy, bilateral: Secondary | ICD-10-CM

## 2018-10-05 DIAGNOSIS — H43813 Vitreous degeneration, bilateral: Secondary | ICD-10-CM | POA: Diagnosis not present

## 2018-10-05 DIAGNOSIS — I10 Essential (primary) hypertension: Secondary | ICD-10-CM | POA: Diagnosis not present

## 2018-10-06 ENCOUNTER — Encounter: Payer: Self-pay | Admitting: *Deleted

## 2018-10-06 ENCOUNTER — Other Ambulatory Visit: Payer: Self-pay | Admitting: *Deleted

## 2018-10-06 NOTE — Patient Outreach (Signed)
[facility]) Care Management  [place]  10/07/2018   Leah Walsh Feb 14, 1941 [id number]   [place] Quarterly Outreach   Referral Date:  02/02/2018 Referral Source:  HTA Concierge Reason for Referral:  Medication Assistance/Chronic Care Improvement Program Insurance:  Health Team Advantage   Outreach Attempt:  Successful telephone outreach to patient for quarterly follow up.  HIPAA verified with patient.  Patient reporting she is doing well.  States her blood pressure is better controlled with the adjustment of her medications (SBP readings ranging 120-140's).  Denies any shortness of breath and has not needed to use her rescue inhaler in the last few weeks.  Fasting blood sugar this morning was 97 with fasting ranges of 70-120's.  Latest Hgb A1C was 7.5 on 07/29/2018.  States she has ample supply of Lantus insulin and is awaiting the delivery of her fast acting insulin (Novolog/Humolog).  Patient reporting continuing to receive her medications from [facility]" through delivery and states up until now her co pays have been free.  She reports her co pays are increasing and she is starting to spread out her medication deliveries.  Encouraged patient to contact [facility] Pharmacist with any medication questions or concerns regarding co pays and medication assistance.    Encounter Medications:  Outpatient Encounter Medications as of 10/06/2018  Medication Sig Note  . albuterol (PROVENTIL HFA) 108 (90 Base) MCG/ACT inhaler Inhale two puffs every four to six hours as needed for cough or wheeze   . allopurinol (ZYLOPRIM) 100 MG tablet Take 100 mg by mouth daily. 08/04/2014: Received from: External Pharmacy  . aspirin 81 MG tablet Take 81 mg by mouth daily.   [name] atorvastatin (LIPITOR) 80 MG tablet Take 80 mg by mouth at bedtime. 08/04/2014: Received from: External Pharmacy  . B-D UF III MINI PEN NEEDLES 31G X 5 MM MISC  08/04/2014: Received from: External Pharmacy  .  beclomethasone (QVAR) 80 MCG/ACT inhaler Inhale 2 puffs into the lungs 2 (two) times daily.   . carvedilol (COREG) 12.5 MG tablet Take 12.5 mg by mouth 2 (two) times daily with a meal.  08/04/2014: Received from: External Pharmacy  . Coenzyme Q10 (COQ10) 200 MG CAPS Take 1 capsule by mouth daily.   . cycloSPORINE (RESTASIS) 0.05 % ophthalmic emulsion Place 1 drop into both eyes 2 (two) times daily.   [name] ezetimibe (ZETIA) 10 MG tablet Take 10 mg by mouth daily.   . fluticasone (FLOVENT HFA) 110 MCG/ACT inhaler Inhale 2 puffs then rinse mouth, twice daily   . hydrALAZINE (APRESOLINE) 50 MG tablet Take 50 mg by mouth 3 (three) times daily.  10/06/2018: Increased 100 mg three times a day  . hydrochlorothiazide (HYDRODIURIL) 25 MG tablet Take 25 mg by mouth daily.   [name] LANTUS SOLOSTAR 100 UNIT/ML Solostar Pen 40 Units 2 (two) times daily.  08/04/2014: Received from: External Pharmacy  . losartan (COZAAR) 50 MG tablet Take 50 mg by mouth daily.  04/17/2017: Taking BID  . montelukast (SINGULAIR) 10 MG tablet Take 1 tablet (10 mg total) by mouth daily.   . Multiple Vitamin (MULTIVITAMIN) tablet Take 1 tablet by mouth daily.   . Multiple Vitamins-Minerals (PRESERVISION AREDS 2 PO) Take by mouth.   [name] NIFEdipine (PROCARDIA-XL/ADALAT CC) 60 MG 24 hr tablet Take 60 mg by mouth 2 (two) times daily.   [name] NITROSTAT 0.4 MG SL tablet Place 0.4 mg under the tongue every 5 (five) minutes as needed.  08/04/2014: Received from: External Pharmacy  .  NOVOLOG FLEXPEN 100 UNIT/ML FlexPen 16 Units 3 (three) times daily with meals.    . Omega-3 Fatty Acids (FISH OIL PO) Take 1 capsule by mouth daily.    [name] omeprazole (PRILOSEC) 40 MG capsule Take 40 mg by mouth daily.    . sertraline (ZOLOFT) 25 MG tablet Take 25 mg by mouth daily.   [name] amLODipine (NORVASC) 10 MG tablet Take 10 mg by mouth daily. 05/07/2018: Reports nephrologist stopped this medication  . Cholecalciferol (VITAMIN D) 2000 UNITS CAPS Take 1 capsule by mouth daily.  03/12/2018: Reports not taking currently  . furosemide (LASIX) 40 MG tablet Take 40 mg by mouth 2 (two) times daily.  04/17/2017: Taking 2 tablets QAM    No facility-administered encounter medications on file as of 10/06/2018.     Functional Status:  In your present state of health, do you have any difficulty performing the following activities: 03/12/2018  Hearing? N  Vision? Y  Comment Macular degeneration  Difficulty concentrating or making decisions? N  Walking or climbing stairs? Y  Comment knee pain after falls  Dressing or bathing? N  Doing errands, shopping? N  Preparing Food and eating ? N  Using the Toilet? N  In the past six months, have you accidently leaked urine? N  Do you have problems with loss of bowel control? N  Managing your Medications? N  Managing your Finances? N  Housekeeping or managing your Housekeeping? N  Some recent data might be hidden    Fall/Depression Screening: Fall Risk  10/06/2018 07/07/2018 03/12/2018  Falls in the past year? 0 1 Yes  Comment no falls since December 2018 No falls since last December 2018 fall x2 in December  Number falls in past yr: - 1 2 or more  Injury with Fall? - 0 No  Risk Factor Category  Low Risk (0 Points) Moderate Risk (1 Point) High Fall Risk  Risk for fall due to : History of fall(s);Impaired vision;Impaired balance/gait;Medication side effect History of fall(s);Impaired vision;Medication side effect;Impaired balance/gait;Impaired mobility History of fall(s);Medication side effect;Impaired mobility;Impaired vision  Follow up Falls evaluation completed;Falls prevention discussed;Education provided Falls prevention discussed;Education provided Falls prevention discussed;Education provided;Falls evaluation completed   PHQ 2/9 Scores 03/12/2018 01/30/2018  PHQ - 2 Score 0 0   THN CM Care Plan Problem One     Most Recent Value  Care Plan Problem One  Financial deficiets related to diabetes management  Role Documenting the  Problem One  [place] for Problem One  Active  THN Long Term Goal   Patient will reduce her Hgb A1C by 0.2 points in the next 90 days. (7.5)  THN Long Term Goal Start Date  10/06/18  THN Long Term Goal Met Date  10/06/18  Interventions for Problem One Long Term Goal  Congratulated patient on current reduction in her Hgb A1C and blood pressure, reviewed medications and indications and encouraged medication compliance, encouraged to keep and attend medical appointments, ensured patient has contact information for [facility] Pharmacist and enouraged patient to contact pharmacist for any medication or copay concern, discussed and encouraged low sodium low carbohydrate food and drink choices, confirmed patient has supply of lantus and will recieve supply of novolog (humolog) today  THN CM Short Term Goal #1      THN CM Short Term Goal #1 Start Date  07/07/18  [facility] CM Short Term Goal #1 Met Date  10/06/18     Appointments:  Attended appointment primary care provider, Dr. Helene Kelp on  08/10/2018 and is due to have follow up appointment in 6 months (May 2020).  Continues to get monthly eye injections.  [facility] and next appointment is 12/01/2018.  Cardiology follow up appointment is 01/13/19 with Dr. Atilano Median.  Plan: RN Health Coach supplied patient with [place] contact information and encouraged patient to contact her for medication concerns. RN Health Coach will send primary care provider Quarterly Update. RN Health Coach will make next telephone outreach to patient within the month of May.  [place] [phone] [email or web address]

## 2018-10-24 DIAGNOSIS — E785 Hyperlipidemia, unspecified: Secondary | ICD-10-CM | POA: Diagnosis not present

## 2018-10-24 DIAGNOSIS — E1165 Type 2 diabetes mellitus with hyperglycemia: Secondary | ICD-10-CM | POA: Diagnosis not present

## 2018-10-26 ENCOUNTER — Encounter (INDEPENDENT_AMBULATORY_CARE_PROVIDER_SITE_OTHER): Payer: PPO | Admitting: Ophthalmology

## 2018-11-03 DIAGNOSIS — Z1231 Encounter for screening mammogram for malignant neoplasm of breast: Secondary | ICD-10-CM | POA: Diagnosis not present

## 2018-11-09 ENCOUNTER — Encounter (INDEPENDENT_AMBULATORY_CARE_PROVIDER_SITE_OTHER): Payer: PPO | Admitting: Ophthalmology

## 2018-11-09 ENCOUNTER — Other Ambulatory Visit: Payer: Self-pay

## 2018-11-09 DIAGNOSIS — H34831 Tributary (branch) retinal vein occlusion, right eye, with macular edema: Secondary | ICD-10-CM

## 2018-11-09 DIAGNOSIS — H43813 Vitreous degeneration, bilateral: Secondary | ICD-10-CM

## 2018-11-09 DIAGNOSIS — I10 Essential (primary) hypertension: Secondary | ICD-10-CM | POA: Diagnosis not present

## 2018-11-09 DIAGNOSIS — H35033 Hypertensive retinopathy, bilateral: Secondary | ICD-10-CM

## 2018-11-09 DIAGNOSIS — H353132 Nonexudative age-related macular degeneration, bilateral, intermediate dry stage: Secondary | ICD-10-CM | POA: Diagnosis not present

## 2018-11-19 DIAGNOSIS — R509 Fever, unspecified: Secondary | ICD-10-CM | POA: Diagnosis not present

## 2018-11-23 DIAGNOSIS — K219 Gastro-esophageal reflux disease without esophagitis: Secondary | ICD-10-CM | POA: Diagnosis not present

## 2018-11-23 DIAGNOSIS — W19XXXA Unspecified fall, initial encounter: Secondary | ICD-10-CM | POA: Diagnosis not present

## 2018-11-23 DIAGNOSIS — Z7982 Long term (current) use of aspirin: Secondary | ICD-10-CM | POA: Diagnosis not present

## 2018-11-23 DIAGNOSIS — R531 Weakness: Secondary | ICD-10-CM | POA: Diagnosis not present

## 2018-11-23 DIAGNOSIS — A419 Sepsis, unspecified organism: Secondary | ICD-10-CM | POA: Diagnosis not present

## 2018-11-23 DIAGNOSIS — E7800 Pure hypercholesterolemia, unspecified: Secondary | ICD-10-CM | POA: Diagnosis not present

## 2018-11-23 DIAGNOSIS — Z8673 Personal history of transient ischemic attack (TIA), and cerebral infarction without residual deficits: Secondary | ICD-10-CM | POA: Diagnosis not present

## 2018-11-23 DIAGNOSIS — E119 Type 2 diabetes mellitus without complications: Secondary | ICD-10-CM | POA: Diagnosis not present

## 2018-11-23 DIAGNOSIS — R05 Cough: Secondary | ICD-10-CM | POA: Diagnosis not present

## 2018-11-23 DIAGNOSIS — R918 Other nonspecific abnormal finding of lung field: Secondary | ICD-10-CM | POA: Diagnosis not present

## 2018-11-23 DIAGNOSIS — J189 Pneumonia, unspecified organism: Secondary | ICD-10-CM | POA: Diagnosis not present

## 2018-11-23 DIAGNOSIS — Z955 Presence of coronary angioplasty implant and graft: Secondary | ICD-10-CM | POA: Diagnosis not present

## 2018-11-23 DIAGNOSIS — R0602 Shortness of breath: Secondary | ICD-10-CM | POA: Diagnosis not present

## 2018-11-23 DIAGNOSIS — I252 Old myocardial infarction: Secondary | ICD-10-CM | POA: Diagnosis not present

## 2018-11-23 DIAGNOSIS — J45909 Unspecified asthma, uncomplicated: Secondary | ICD-10-CM | POA: Diagnosis not present

## 2018-11-23 DIAGNOSIS — I10 Essential (primary) hypertension: Secondary | ICD-10-CM | POA: Diagnosis not present

## 2018-11-23 DIAGNOSIS — Z794 Long term (current) use of insulin: Secondary | ICD-10-CM | POA: Diagnosis not present

## 2018-11-23 DIAGNOSIS — R197 Diarrhea, unspecified: Secondary | ICD-10-CM | POA: Diagnosis not present

## 2018-11-23 DIAGNOSIS — Z79899 Other long term (current) drug therapy: Secondary | ICD-10-CM | POA: Diagnosis not present

## 2018-11-24 ENCOUNTER — Encounter (HOSPITAL_COMMUNITY): Payer: Self-pay | Admitting: Family Medicine

## 2018-11-24 ENCOUNTER — Other Ambulatory Visit: Payer: Self-pay

## 2018-11-24 ENCOUNTER — Inpatient Hospital Stay (HOSPITAL_COMMUNITY)
Admission: AD | Admit: 2018-11-24 | Discharge: 2018-12-03 | DRG: 193 | Disposition: A | Discharge status: Home Health | Payer: PPO | Source: Other Acute Inpatient Hospital | Attending: Internal Medicine | Admitting: Internal Medicine

## 2018-11-24 ENCOUNTER — Other Ambulatory Visit: Payer: Self-pay | Admitting: *Deleted

## 2018-11-24 DIAGNOSIS — N179 Acute kidney failure, unspecified: Secondary | ICD-10-CM | POA: Diagnosis not present

## 2018-11-24 DIAGNOSIS — I5031 Acute diastolic (congestive) heart failure: Secondary | ICD-10-CM | POA: Diagnosis not present

## 2018-11-24 DIAGNOSIS — R6889 Other general symptoms and signs: Secondary | ICD-10-CM

## 2018-11-24 DIAGNOSIS — D509 Iron deficiency anemia, unspecified: Secondary | ICD-10-CM | POA: Diagnosis present

## 2018-11-24 DIAGNOSIS — G4733 Obstructive sleep apnea (adult) (pediatric): Secondary | ICD-10-CM | POA: Diagnosis present

## 2018-11-24 DIAGNOSIS — J189 Pneumonia, unspecified organism: Secondary | ICD-10-CM | POA: Diagnosis not present

## 2018-11-24 DIAGNOSIS — E871 Hypo-osmolality and hyponatremia: Secondary | ICD-10-CM | POA: Diagnosis present

## 2018-11-24 DIAGNOSIS — Z885 Allergy status to narcotic agent status: Secondary | ICD-10-CM

## 2018-11-24 DIAGNOSIS — E861 Hypovolemia: Secondary | ICD-10-CM | POA: Diagnosis not present

## 2018-11-24 DIAGNOSIS — E876 Hypokalemia: Secondary | ICD-10-CM | POA: Diagnosis not present

## 2018-11-24 DIAGNOSIS — R7989 Other specified abnormal findings of blood chemistry: Secondary | ICD-10-CM | POA: Diagnosis not present

## 2018-11-24 DIAGNOSIS — E119 Type 2 diabetes mellitus without complications: Secondary | ICD-10-CM | POA: Diagnosis not present

## 2018-11-24 DIAGNOSIS — E1122 Type 2 diabetes mellitus with diabetic chronic kidney disease: Secondary | ICD-10-CM | POA: Diagnosis present

## 2018-11-24 DIAGNOSIS — R778 Other specified abnormalities of plasma proteins: Secondary | ICD-10-CM

## 2018-11-24 DIAGNOSIS — N390 Urinary tract infection, site not specified: Secondary | ICD-10-CM | POA: Diagnosis not present

## 2018-11-24 DIAGNOSIS — I252 Old myocardial infarction: Secondary | ICD-10-CM | POA: Diagnosis not present

## 2018-11-24 DIAGNOSIS — Z833 Family history of diabetes mellitus: Secondary | ICD-10-CM

## 2018-11-24 DIAGNOSIS — I160 Hypertensive urgency: Secondary | ICD-10-CM | POA: Diagnosis not present

## 2018-11-24 DIAGNOSIS — Z8249 Family history of ischemic heart disease and other diseases of the circulatory system: Secondary | ICD-10-CM

## 2018-11-24 DIAGNOSIS — Z794 Long term (current) use of insulin: Secondary | ICD-10-CM

## 2018-11-24 DIAGNOSIS — D631 Anemia in chronic kidney disease: Secondary | ICD-10-CM | POA: Diagnosis present

## 2018-11-24 DIAGNOSIS — E11649 Type 2 diabetes mellitus with hypoglycemia without coma: Secondary | ICD-10-CM | POA: Diagnosis not present

## 2018-11-24 DIAGNOSIS — Z9049 Acquired absence of other specified parts of digestive tract: Secondary | ICD-10-CM

## 2018-11-24 DIAGNOSIS — I159 Secondary hypertension, unspecified: Secondary | ICD-10-CM | POA: Diagnosis present

## 2018-11-24 DIAGNOSIS — J4530 Mild persistent asthma, uncomplicated: Secondary | ICD-10-CM | POA: Diagnosis present

## 2018-11-24 DIAGNOSIS — I130 Hypertensive heart and chronic kidney disease with heart failure and stage 1 through stage 4 chronic kidney disease, or unspecified chronic kidney disease: Secondary | ICD-10-CM | POA: Diagnosis present

## 2018-11-24 DIAGNOSIS — Z79899 Other long term (current) drug therapy: Secondary | ICD-10-CM

## 2018-11-24 DIAGNOSIS — R918 Other nonspecific abnormal finding of lung field: Secondary | ICD-10-CM | POA: Diagnosis not present

## 2018-11-24 DIAGNOSIS — E785 Hyperlipidemia, unspecified: Secondary | ICD-10-CM | POA: Diagnosis present

## 2018-11-24 DIAGNOSIS — F329 Major depressive disorder, single episode, unspecified: Secondary | ICD-10-CM | POA: Diagnosis present

## 2018-11-24 DIAGNOSIS — R112 Nausea with vomiting, unspecified: Secondary | ICD-10-CM | POA: Diagnosis present

## 2018-11-24 DIAGNOSIS — B952 Enterococcus as the cause of diseases classified elsewhere: Secondary | ICD-10-CM | POA: Diagnosis present

## 2018-11-24 DIAGNOSIS — N184 Chronic kidney disease, stage 4 (severe): Secondary | ICD-10-CM | POA: Diagnosis present

## 2018-11-24 DIAGNOSIS — R197 Diarrhea, unspecified: Secondary | ICD-10-CM | POA: Diagnosis present

## 2018-11-24 DIAGNOSIS — J9601 Acute respiratory failure with hypoxia: Secondary | ICD-10-CM | POA: Diagnosis not present

## 2018-11-24 DIAGNOSIS — I10 Essential (primary) hypertension: Secondary | ICD-10-CM

## 2018-11-24 DIAGNOSIS — J181 Lobar pneumonia, unspecified organism: Secondary | ICD-10-CM | POA: Diagnosis not present

## 2018-11-24 DIAGNOSIS — I701 Atherosclerosis of renal artery: Secondary | ICD-10-CM | POA: Diagnosis present

## 2018-11-24 DIAGNOSIS — I361 Nonrheumatic tricuspid (valve) insufficiency: Secondary | ICD-10-CM | POA: Diagnosis not present

## 2018-11-24 DIAGNOSIS — I2510 Atherosclerotic heart disease of native coronary artery without angina pectoris: Secondary | ICD-10-CM | POA: Diagnosis not present

## 2018-11-24 DIAGNOSIS — J9691 Respiratory failure, unspecified with hypoxia: Secondary | ICD-10-CM | POA: Diagnosis present

## 2018-11-24 DIAGNOSIS — Z90710 Acquired absence of both cervix and uterus: Secondary | ICD-10-CM

## 2018-11-24 DIAGNOSIS — R0602 Shortness of breath: Secondary | ICD-10-CM

## 2018-11-24 DIAGNOSIS — I371 Nonrheumatic pulmonary valve insufficiency: Secondary | ICD-10-CM | POA: Diagnosis not present

## 2018-11-24 DIAGNOSIS — Z888 Allergy status to other drugs, medicaments and biological substances status: Secondary | ICD-10-CM

## 2018-11-24 HISTORY — DX: Nausea with vomiting, unspecified: R11.2

## 2018-11-24 HISTORY — DX: Other specified abnormalities of plasma proteins: R77.8

## 2018-11-24 HISTORY — DX: Hypertensive heart and chronic kidney disease with heart failure and stage 1 through stage 4 chronic kidney disease, or unspecified chronic kidney disease: I13.0

## 2018-11-24 HISTORY — DX: Chronic kidney disease, stage 4 (severe): N18.4

## 2018-11-24 HISTORY — DX: Hypo-osmolality and hyponatremia: E87.1

## 2018-11-24 HISTORY — DX: Other specified abnormal findings of blood chemistry: R79.89

## 2018-11-24 HISTORY — DX: Chronic kidney disease, stage 4 (severe): N17.9

## 2018-11-24 HISTORY — DX: Pneumonia, unspecified organism: J18.9

## 2018-11-24 LAB — LACTATE DEHYDROGENASE: LDH: 264 U/L — ABNORMAL HIGH (ref 98–192)

## 2018-11-24 LAB — COMPREHENSIVE METABOLIC PANEL
ALT: 21 U/L (ref 0–44)
AST: 26 U/L (ref 15–41)
Albumin: 2.7 g/dL — ABNORMAL LOW (ref 3.5–5.0)
Alkaline Phosphatase: 87 U/L (ref 38–126)
Anion gap: 11 (ref 5–15)
BUN: 71 mg/dL — ABNORMAL HIGH (ref 8–23)
CHLORIDE: 98 mmol/L (ref 98–111)
CO2: 23 mmol/L (ref 22–32)
Calcium: 8.2 mg/dL — ABNORMAL LOW (ref 8.9–10.3)
Creatinine, Ser: 3.35 mg/dL — ABNORMAL HIGH (ref 0.44–1.00)
GFR calc Af Amer: 15 mL/min — ABNORMAL LOW (ref >60)
GFR calc non Af Amer: 13 mL/min — ABNORMAL LOW (ref >60)
Glucose, Bld: 266 mg/dL — ABNORMAL HIGH (ref 70–99)
POTASSIUM: 4 mmol/L (ref 3.5–5.1)
Sodium: 132 mmol/L — ABNORMAL LOW (ref 135–145)
Total Bilirubin: 0.5 mg/dL (ref 0.3–1.2)
Total Protein: 6.4 g/dL — ABNORMAL LOW (ref 6.5–8.1)

## 2018-11-24 LAB — URINALYSIS, COMPLETE (UACMP) WITH MICROSCOPIC
Bilirubin Urine: NEGATIVE
Glucose, UA: NEGATIVE mg/dL
Ketones, ur: NEGATIVE mg/dL
NITRITE: NEGATIVE
PH: 5 (ref 5.0–8.0)
Protein, ur: 100 mg/dL — AB
Specific Gravity, Urine: 1.013 (ref 1.005–1.030)

## 2018-11-24 LAB — CBC WITH DIFFERENTIAL/PLATELET
Abs Immature Granulocytes: 0.16 10*3/uL — ABNORMAL HIGH (ref 0.00–0.07)
Basophils Absolute: 0 10*3/uL (ref 0.0–0.1)
Basophils Relative: 0 %
Eosinophils Absolute: 0.1 10*3/uL (ref 0.0–0.5)
Eosinophils Relative: 1 %
HCT: 27.4 % — ABNORMAL LOW (ref 36.0–46.0)
Hemoglobin: 8.5 g/dL — ABNORMAL LOW (ref 12.0–15.0)
Immature Granulocytes: 1 %
Lymphocytes Relative: 6 %
Lymphs Abs: 0.9 10*3/uL (ref 0.7–4.0)
MCH: 23.8 pg — ABNORMAL LOW (ref 26.0–34.0)
MCHC: 31 g/dL (ref 30.0–36.0)
MCV: 76.8 fL — ABNORMAL LOW (ref 80.0–100.0)
Monocytes Absolute: 1.6 10*3/uL — ABNORMAL HIGH (ref 0.1–1.0)
Monocytes Relative: 11 %
NEUTROS PCT: 81 %
Neutro Abs: 12.8 10*3/uL — ABNORMAL HIGH (ref 1.7–7.7)
Platelets: 175 10*3/uL (ref 150–400)
RBC: 3.57 MIL/uL — AB (ref 3.87–5.11)
RDW: 14.7 % (ref 11.5–15.5)
WBC: 15.7 10*3/uL — ABNORMAL HIGH (ref 4.0–10.5)
nRBC: 0 % (ref 0.0–0.2)

## 2018-11-24 LAB — FERRITIN: Ferritin: 74 ng/mL (ref 11–307)

## 2018-11-24 LAB — CREATININE, URINE, RANDOM: Creatinine, Urine: 108.06 mg/dL

## 2018-11-24 LAB — GLUCOSE, CAPILLARY
GLUCOSE-CAPILLARY: 197 mg/dL — AB (ref 70–99)
GLUCOSE-CAPILLARY: 146 mg/dL — AB (ref 70–99)
Glucose-Capillary: 261 mg/dL — ABNORMAL HIGH (ref 70–99)
Glucose-Capillary: 227 mg/dL — ABNORMAL HIGH (ref 70–99)
Glucose-Capillary: 141 mg/dL — ABNORMAL HIGH (ref 70–99)

## 2018-11-24 LAB — STREP PNEUMONIAE URINARY ANTIGEN: Strep Pneumo Urinary Antigen: NEGATIVE

## 2018-11-24 LAB — PROCALCITONIN: Procalcitonin: 14.44 ng/mL

## 2018-11-24 LAB — TROPONIN I: TROPONIN I: 0.04 ng/mL — AB (ref <0.03)

## 2018-11-24 LAB — SODIUM, URINE, RANDOM: SODIUM UR: 33 mmol/L

## 2018-11-24 MED ORDER — INSULIN ASPART 100 UNIT/ML ~~LOC~~ SOLN
0.0000 [IU] | Freq: Three times a day (TID) | SUBCUTANEOUS | Status: DC
  Administered 2018-11-24: 3 [IU] via SUBCUTANEOUS
  Administered 2018-11-24: 1 [IU] via SUBCUTANEOUS
  Administered 2018-11-24: 2 [IU] via SUBCUTANEOUS
  Administered 2018-11-25 – 2018-11-26 (×2): 1 [IU] via SUBCUTANEOUS
  Administered 2018-11-27 (×2): 2 [IU] via SUBCUTANEOUS
  Administered 2018-11-28 (×2): 1 [IU] via SUBCUTANEOUS
  Administered 2018-11-28 – 2018-11-29 (×2): 2 [IU] via SUBCUTANEOUS
  Administered 2018-11-29 – 2018-11-30 (×2): 1 [IU] via SUBCUTANEOUS
  Administered 2018-12-01 – 2018-12-03 (×3): 2 [IU] via SUBCUTANEOUS

## 2018-11-24 MED ORDER — HYDRALAZINE HCL 50 MG PO TABS
100.0000 mg | ORAL_TABLET | Freq: Three times a day (TID) | ORAL | Status: DC
  Administered 2018-11-24 – 2018-11-26 (×6): 100 mg via ORAL
  Filled 2018-11-24 (×6): qty 2

## 2018-11-24 MED ORDER — ACETAMINOPHEN 650 MG RE SUPP: 650.0000 mg | Freq: Four times a day (QID) | RECTAL | Status: DC | PRN

## 2018-11-24 MED ORDER — ONDANSETRON HCL 4 MG PO TABS: 4.0000 mg | ORAL_TABLET | Freq: Four times a day (QID) | ORAL | Status: DC | PRN

## 2018-11-24 MED ORDER — HYDROXYCHLOROQUINE SULFATE 200 MG PO TABS
400.0000 mg | ORAL_TABLET | Freq: Two times a day (BID) | ORAL | Status: DC
  Administered 2018-11-24: 400 mg via ORAL
  Filled 2018-11-24: qty 2

## 2018-11-24 MED ORDER — SODIUM CHLORIDE 0.9% FLUSH
3.0000 mL | Freq: Two times a day (BID) | INTRAVENOUS | Status: DC
  Administered 2018-11-24 – 2018-12-03 (×17): 3 mL via INTRAVENOUS

## 2018-11-24 MED ORDER — HYDRALAZINE HCL 50 MG PO TABS
50.0000 mg | ORAL_TABLET | Freq: Three times a day (TID) | ORAL | Status: DC
  Administered 2018-11-24: 50 mg via ORAL
  Filled 2018-11-24: qty 1

## 2018-11-24 MED ORDER — AMLODIPINE BESYLATE 10 MG PO TABS: 10.0000 mg | ORAL_TABLET | Freq: Every day | ORAL | Status: DC

## 2018-11-24 MED ORDER — CARVEDILOL 12.5 MG PO TABS
12.5000 mg | ORAL_TABLET | Freq: Two times a day (BID) | ORAL | Status: DC
  Administered 2018-11-24 – 2018-11-25 (×4): 12.5 mg via ORAL
  Filled 2018-11-24 (×5): qty 1

## 2018-11-24 MED ORDER — HYDROCODONE-ACETAMINOPHEN 5-325 MG PO TABS
1.0000 | ORAL_TABLET | ORAL | Status: DC | PRN
  Administered 2018-12-02 (×2): 1 via ORAL
  Filled 2018-11-24 (×2): qty 1

## 2018-11-24 MED ORDER — HEPARIN SODIUM (PORCINE) 5000 UNIT/ML IJ SOLN
5000.0000 [IU] | Freq: Three times a day (TID) | INTRAMUSCULAR | Status: DC
  Administered 2018-11-24 – 2018-12-03 (×28): 5000 [IU] via SUBCUTANEOUS
  Filled 2018-11-24 (×29): qty 1

## 2018-11-24 MED ORDER — LABETALOL HCL 5 MG/ML IV SOLN
10.0000 mg | INTRAVENOUS | Status: DC | PRN
  Administered 2018-11-24 – 2018-11-29 (×2): 10 mg via INTRAVENOUS
  Filled 2018-11-24 (×3): qty 4

## 2018-11-24 MED ORDER — NIFEDIPINE ER OSMOTIC RELEASE 90 MG PO TB24
90.0000 mg | ORAL_TABLET | Freq: Every day | ORAL | Status: DC
  Administered 2018-11-25 – 2018-12-03 (×9): 90 mg via ORAL
  Filled 2018-11-24 (×8): qty 1
  Filled 2018-11-24: qty 3
  Filled 2018-11-24: qty 1

## 2018-11-24 MED ORDER — SODIUM CHLORIDE 0.9 % IV SOLN
1.0000 g | Freq: Every day | INTRAVENOUS | Status: DC
  Administered 2018-11-24 – 2018-11-27 (×4): 1 g via INTRAVENOUS
  Filled 2018-11-24 (×5): qty 10

## 2018-11-24 MED ORDER — SODIUM CHLORIDE 0.9 % IV SOLN
500.0000 mg | INTRAVENOUS | Status: DC
  Administered 2018-11-24 – 2018-11-27 (×4): 500 mg via INTRAVENOUS
  Filled 2018-11-24 (×5): qty 500

## 2018-11-24 MED ORDER — MUSCLE RUB 10-15 % EX CREA
TOPICAL_CREAM | CUTANEOUS | Status: DC | PRN
  Administered 2018-11-24: 03:00:00 via TOPICAL
  Filled 2018-11-24: qty 85

## 2018-11-24 MED ORDER — SODIUM CHLORIDE 0.9 % IV SOLN
INTRAVENOUS | Status: DC
  Administered 2018-11-24: 03:00:00 via INTRAVENOUS

## 2018-11-24 MED ORDER — ALBUTEROL SULFATE HFA 108 (90 BASE) MCG/ACT IN AERS: 2.0000 | INHALATION_SPRAY | RESPIRATORY_TRACT | Status: DC | PRN

## 2018-11-24 MED ORDER — ACETAMINOPHEN 325 MG PO TABS
650.0000 mg | ORAL_TABLET | Freq: Four times a day (QID) | ORAL | Status: DC | PRN
  Administered 2018-11-25 – 2018-12-03 (×3): 650 mg via ORAL
  Filled 2018-11-24 (×3): qty 2

## 2018-11-24 MED ORDER — NIFEDIPINE ER OSMOTIC RELEASE 30 MG PO TB24
60.0000 mg | ORAL_TABLET | Freq: Two times a day (BID) | ORAL | Status: DC
  Administered 2018-11-24: 60 mg via ORAL
  Filled 2018-11-24: qty 2

## 2018-11-24 MED ORDER — HYDRALAZINE HCL 20 MG/ML IJ SOLN
10.0000 mg | INTRAMUSCULAR | Status: DC | PRN
  Administered 2018-11-24: 10 mg via INTRAVENOUS
  Filled 2018-11-24: qty 1

## 2018-11-24 MED ORDER — SODIUM CHLORIDE 0.9 % IV SOLN
INTRAVENOUS | Status: DC
  Administered 2018-11-24 – 2018-11-25 (×4): via INTRAVENOUS

## 2018-11-24 MED ORDER — ONDANSETRON HCL 4 MG/2ML IJ SOLN
4.0000 mg | Freq: Four times a day (QID) | INTRAMUSCULAR | Status: DC | PRN
  Administered 2018-11-24 – 2018-11-26 (×2): 4 mg via INTRAVENOUS
  Filled 2018-11-24 (×2): qty 2

## 2018-11-24 MED ORDER — HYDROXYCHLOROQUINE SULFATE 200 MG PO TABS: 200.0000 mg | ORAL_TABLET | Freq: Two times a day (BID) | ORAL | Status: DC

## 2018-11-24 MED ORDER — INSULIN GLARGINE 100 UNIT/ML ~~LOC~~ SOLN
15.0000 [IU] | Freq: Two times a day (BID) | SUBCUTANEOUS | Status: DC
  Administered 2018-11-24 – 2018-11-25 (×5): 15 [IU] via SUBCUTANEOUS
  Filled 2018-11-24 (×9): qty 0.15

## 2018-11-24 MED ORDER — INSULIN ASPART 100 UNIT/ML ~~LOC~~ SOLN
0.0000 [IU] | Freq: Every day | SUBCUTANEOUS | Status: DC
  Administered 2018-11-24: 3 [IU] via SUBCUTANEOUS

## 2018-11-24 NOTE — Patient Outreach (Addendum)
[facility]) Care Management  11/24/2018  Leah Walsh Mar 21, 1941 [id number]   RN Health Coach Quarterly Outreach  Referral Date:02/02/2018 Referral Source:HTA Concierge Reason for Referral:Medication Assistance/Chronic Care Improvement Program Insurance:Health Team Advantage   Outreach Attempt:  Received notification patient hospitalized at [facility] for pneumonia and possible COVID 57.  Sent message to [facility].  Plan:  RN Health Coach will close case and make patient inactive with [facility] based on Hospital Liaison's recommendations.  HTA UM to do Transition of Care follow up on discharge.  RN Health Coach will send primary care provider Case Closure Letter.  RN Health Coach will send patient Case Closure Letter.  [place] [phone] [email or web address]

## 2018-11-24 NOTE — Progress Notes (Addendum)
Patient is a 78 year old female with history of significant for asthma, chronic kidney disease stage 3(creatinine on 08/24/18 was 1.8), hypertension, hyperlipidemia, insulin-dependent diabetes mellitus, OSA who presented to the emergency department with complaints of fever, nausea, vomiting, diarrhea and shortness of breath.  She developed symptoms about a week ago.  She was evaluated by her PCP and underwent influenza and COVID-19 test on 11/18/2018.  Influenza was found to be negative and Covid -19 has been pending.   On presentation she was found afebrile, saturating 90% on room air.  Chest x-ray was concerning for new right basilar pneumonia. She had  Leukocytosis. Absolute lymphocyte count was low,elevated procalcitonin. Also found to have acute kidney injury.  Troponin slightly elevated, procalcitonin elevated.  LDH is mildly elevated.Ferritin normal.  CRP elevated.UA showed large amount of WBCs. Received fax earlier this morning stating that the blood cultures sent as an outpatient has shown gram positive rod in anaerobic bottle(likely contaminant).Its not actually Covid -19 test(there was confusion earlier that we got fax saying Covid-19 positivity) She was placed on 2 L oxygen per minute on admission. She has been started on antibiotics to cover for community-acquired pneumonia.  Plan is to continue current antibiotics, maintain droplet/contact precautions for now.  Continue IV fluids for acute kidney injury.We will send blood cultures,urine culture.Currently she is afebrile. Also noted to be hypertensive this morning.  Medications adjusted. Otherwise she is  hemodynamically stable . Patient seen by Dr. Myna Hidalgo earlier this morning.

## 2018-11-24 NOTE — Progress Notes (Signed)
CRITICAL VALUE ALERT  Critical Value: Troponin=0.04  Date & Time Notied:  11/24/18; 9558  Provider Notified: Dr. Myna Hidalgo  Orders Received/Actions taken: Awaiting orders.

## 2018-11-24 NOTE — Plan of Care (Signed)
  Problem: Education: Goal: Knowledge of General Education information will improve Description Including pain rating scale, medication(s)/side effects and non-pharmacologic comfort measures Outcome: Progressing   Problem: Health Behavior/Discharge Planning: Goal: Ability to manage health-related needs will improve Outcome: Progressing   Problem: Clinical Measurements: Goal: Will remain free from infection Outcome: Progressing Note:  Pt receiving IV antibiotics.    Problem: Coping: Goal: Level of anxiety will decrease Outcome: Progressing   Problem: Safety: Goal: Ability to remain free from injury will improve Outcome: Progressing

## 2018-11-24 NOTE — Progress Notes (Signed)
Dr. Myna Hidalgo made aware thru text paged still elevated BP at 203/49, PR100, 10mg  PRN hydralazine given at 0151 for BP at 195/56, PR90.  Patient not in distress.  Dr. Myna Hidalgo called back and was concern of the low DBP and will just give coreg for now and to monitor BP.  Will have Pharmacy reconciled patient's medications.  Will monitor.

## 2018-11-24 NOTE — Progress Notes (Signed)
Text paged triad on-call, Tylene Fantasia to correct earlier text paged to Dr. Myna Hidalgo.  Patient's Novel corona testing at curbside done last week by pt's PCP is still pending.  Only positive blood culture for gram positive rod done at [facility] per facsimile copy.

## 2018-11-24 NOTE — Progress Notes (Signed)
Dr. Myna Hidalgo made aware thru text paged for still elevated BP=197/58, PR90 and called back to order for hydralazine 50mg  PO Q8.  Will administered as ordered.

## 2018-11-24 NOTE — H&P (Addendum)
History and Physical    Leah Walsh MVE:[id number] DOB: [birth year] DOA: 11/24/2018  PCP: [name], MD   Patient coming from: Home, by way of [facility] ED   Chief Complaint: Fever, SOB, N/V/D   HPI: Leah Walsh is a 78 y.o. female with medical history significant for asthma, chronic kidney disease stage IV, hypertension, hyperlipidemia, insulin-dependent diabetes mellitus, and OSA, now presenting to the emergency department for evaluation of fevers, nausea, vomiting, diarrhea, and shortness of breath.  Patient reports that she developed the aforementioned symptoms just over 1 week ago and was evaluated by her PCP with influenza and COVID-19 testing on 11/18/2018.  She reports that the influenza PCR was negative and she was told to expect 14 days for COVID results.  Since that time, she has had ongoing subjective fevers, chills, and shortness of breath.  She had been experiencing nausea with vomiting and diarrhea, but reports that she has not had any of this in the past 24 hours.  There was no abdominal pain or bleeding.  She denies any chest pain or palpitations.  She reports occasional leg swelling, but no leg swelling or tenderness now.  She denies any recent travel or sick contacts.  [facility] ED Course: Upon arrival to the ED, patient is found to be afebrile, saturating 90% on room air, breathing 22 times per minute, slightly hypertensive, and with normal heart rate.  Chest x-ray is concerning for new right basilar pneumonia.  CBC features a sodium of 132, BUN 69, and creatinine of 3.30, up from 1.8 in December.  CBC is notable for leukocytosis to 11,800 and a stable microcytic anemia with hemoglobin of 8.6.  Absolute lymphocyte count is low at 354.  Lactic acid is reassuringly normal.  Troponin is slightly elevated 0.08 and proBNP is elevated to 6620.  Procalcitonin is elevated to 17.8, LDH is normal, and CRP elevated.  Patient was treated with 500 cc normal saline, acetaminophen she  was placed on 2 L/min of supplemental oxygen.  Transfer to [facility] was arranged for further evaluation and management.  Review of Systems:  All other systems reviewed and apart from HPI, are negative.  Past Medical History:  Diagnosis Date   Asthma    Diabetes ([place])    Heart attack ([place]) 2014   Hyperlipidemia    Hypertension    Kidney disease    Sleep apnea    Using CPAP    Past Surgical History:  Procedure Laterality Date   ABDOMINAL HYSTERECTOMY     CAROTID STENT     CHOLECYSTECTOMY     TUBAL LIGATION     TUMOR REMOVAL  12/2016   Tumor on ovary   VESICOVAGINAL FISTULA CLOSURE W/ TAH       reports that she has never smoked. She has never used smokeless tobacco. She reports that she does not drink alcohol or use drugs.  Allergies  Allergen Reactions   Compazine [Prochlorperazine Edisylate] Other (See Comments)    Stroke like symptoms   Colcrys [Colchicine] Diarrhea   Hydrocodone    Codeine Nausea And Vomiting    Family History  Problem Relation Age of Onset   Heart disease Mother    Heart disease Father    Heart disease Maternal Grandfather    Heart disease Maternal Grandmother    Heart disease Paternal Grandfather    Heart disease Paternal Grandmother    Lung cancer Brother    Diabetes Sister    Diabetes Sister  Prior to Admission medications   Medication Sig Start Date End Date Taking? Authorizing Provider  albuterol (PROVENTIL HFA) 108 (90 Base) MCG/ACT inhaler Inhale two puffs every four to six hours as needed for cough or wheeze 04/17/17   Kozlow, Donnamarie Poag, MD  allopurinol (ZYLOPRIM) 100 MG tablet Take 100 mg by mouth daily. 07/20/14   [provider]  amLODipine (NORVASC) 10 MG tablet Take 10 mg by mouth daily.    [provider]  aspirin 81 MG tablet Take 81 mg by mouth daily.    [provider]  atorvastatin (LIPITOR) 80 MG tablet Take 80 mg by mouth at bedtime. 07/09/14   [provider]  B-D UF III MINI PEN NEEDLES 31G X 5 MM MISC  07/09/14   [provider]  beclomethasone (QVAR) 80 MCG/ACT inhaler Inhale 2 puffs into the lungs 2 (two) times daily. 04/29/18   Baird Lyons D, MD  carvedilol (COREG) 12.5 MG tablet Take 12.5 mg by mouth 2 (two) times daily with a meal.  07/09/14   [provider]  Cholecalciferol (VITAMIN D) 2000 UNITS CAPS Take 1 capsule by mouth daily.    [provider]  Coenzyme Q10 (COQ10) 200 MG CAPS Take 1 capsule by mouth daily.    [provider]  cycloSPORINE (RESTASIS) 0.05 % ophthalmic emulsion Place 1 drop into both eyes 2 (two) times daily.    [provider]  ezetimibe (ZETIA) 10 MG tablet Take 10 mg by mouth daily.    [provider]  fluticasone (FLOVENT HFA) 110 MCG/ACT inhaler Inhale 2 puffs then rinse mouth, twice daily 04/29/18   Baird Lyons D, MD  furosemide (LASIX) 40 MG tablet Take 40 mg by mouth 2 (two) times daily.  06/06/14   [provider]  hydrALAZINE (APRESOLINE) 50 MG tablet Take 50 mg by mouth 3 (three) times daily.  07/23/14   [provider]  hydrochlorothiazide (HYDRODIURIL) 25 MG tablet Take 25 mg by mouth daily.    [provider]  LANTUS SOLOSTAR 100 UNIT/ML Solostar Pen 40 Units 2 (two) times daily.  06/17/14   [provider]  losartan (COZAAR) 50 MG tablet Take 50 mg by mouth daily.  06/16/14   [provider]  montelukast (SINGULAIR) 10 MG tablet Take 1 tablet (10 mg total) by mouth daily. 04/17/17   Kozlow, Donnamarie Poag, MD  Multiple Vitamin (MULTIVITAMIN) tablet Take 1 tablet by mouth daily.    [provider]  Multiple Vitamins-Minerals (PRESERVISION AREDS 2 PO) Take by mouth.    [provider]  NIFEdipine (PROCARDIA-XL/ADALAT CC) 60 MG 24 hr tablet Take 60 mg by mouth 2 (two) times daily.    [provider]  NITROSTAT 0.4 MG SL tablet Place 0.4 mg under the tongue every 5 (five) minutes  as needed.  07/11/14   [provider]  NOVOLOG FLEXPEN 100 UNIT/ML FlexPen 16 Units 3 (three) times daily with meals.  04/14/17   [provider]  Omega-3 Fatty Acids (FISH OIL PO) Take 1 capsule by mouth daily.     [provider]  omeprazole (PRILOSEC) 40 MG capsule Take 40 mg by mouth daily.  03/20/17   [provider]  sertraline (ZOLOFT) 25 MG tablet Take 25 mg by mouth daily.    [provider]    Physical Exam: Vitals:   11/24/18 0050  BP: (!) 195/56  Pulse: 90  Resp: 14  Temp: 98.9 F (37.2 C)  TempSrc: Oral  SpO2: 100%  Weight: 77.4 kg  Height: 5\' 8"  (1.727 m)    Constitutional: NAD, calm  Eyes: PERTLA, lids and conjunctivae normal ENMT: Mucous membranes are moist. Posterior pharynx clear of any exudate or lesions.   Neck: normal, supple, no masses, no thyromegaly Respiratory: no wheezing, no crackles. Normal respiratory effort.  Cardiovascular: S1 & S2 heard, regular rate and rhythm. No extremity edema.   Abdomen: No distension, no tenderness, soft. Bowel sounds active.  Musculoskeletal: no clubbing / cyanosis. No joint deformity upper and lower extremities.    Skin: no significant rashes, lesions, ulcers. Warm, dry, well-perfused. Neurologic: CN 2-12 grossly intact. Sensation intact. Strength 5/5 in all 4 limbs.  Psychiatric: Alert and oriented x 3. Pleasant, cooperative.    Labs on Admission: I have personally reviewed following labs and imaging studies  CBC: No results for input(s): WBC, NEUTROABS, HGB, HCT, MCV, PLT in the last 168 hours. Basic Metabolic Panel: No results for input(s): NA, K, CL, CO2, GLUCOSE, BUN, CREATININE, CALCIUM, MG, PHOS in the last 168 hours. GFR: CrCl cannot be calculated (No successful lab value found.). Liver Function Tests: No results for input(s): AST, ALT, ALKPHOS, BILITOT, PROT, ALBUMIN in the last 168 hours. No results for input(s): LIPASE, AMYLASE in the last 168 hours. No results  for input(s): AMMONIA in the last 168 hours. Coagulation Profile: No results for input(s): INR, PROTIME in the last 168 hours. Cardiac Enzymes: No results for input(s): CKTOTAL, CKMB, CKMBINDEX, TROPONINI in the last 168 hours. BNP (last 3 results) No results for input(s): PROBNP in the last 8760 hours. HbA1C: No results for input(s): HGBA1C in the last 72 hours. CBG: No results for input(s): GLUCAP in the last 168 hours. Lipid Profile: No results for input(s): CHOL, HDL, LDLCALC, TRIG, CHOLHDL, LDLDIRECT in the last 72 hours. Thyroid Function Tests: No results for input(s): TSH, T4TOTAL, FREET4, T3FREE, THYROIDAB in the last 72 hours. Anemia Panel: No results for input(s): VITAMINB12, FOLATE, FERRITIN, TIBC, IRON, RETICCTPCT in the last 72 hours. Urine analysis: No results found for: COLORURINE, APPEARANCEUR, LABSPEC, PHURINE, GLUCOSEU, HGBUR, BILIRUBINUR, KETONESUR, PROTEINUR, UROBILINOGEN, NITRITE, LEUKOCYTESUR Sepsis Labs: @LABRCNTIP (procalcitonin:4,lacticidven:4) )No results found for this or any previous visit (from the past 240 hour(s)).   Radiological Exams on Admission: No results found.  EKG: Ordered, not yet performed.   Assessment/Plan   1. CAP  - Presents with 1 wk of fever, SOB, and N/V/D without abdominal pain  - She was tested for COVID-19 by her PCP on 3/25 with results still pending - She is afebrile in ED, saturating 90% on rm air, slightly tachypneic  - CXR in ED is concerning for RLL PNA, influenza PCR negative, there is leukocytosis to 11.8, procalcitonin 17.8, and lactate is normal  - She was started on Rocephin and azithromycin in ED  - Check sputum culture and strep pneumo antigen, trend procalcitonin, continue Rocephin and azithromycin, and maintain droplet and contact precautions for now    2. Acute kidney injury superimposed on stage IV CKD  - SCr is 3.30 in ED, up from 1.81 in December  - Likely prerenal azotemia in setting of recent N/V/D;  fortunately, vomiting and diarrhea appears to have resolved  - Check UA and FENa, renally-dose medications, avoid nephrotoxins, continue IVF hydration, and repeat chem panel    3. Asthma  - No cough or wheezing on admission  - Continue albuterol MDI as needed    4. Hypertension with hypertensive urgency  - SBP 170 in ED, increased to 200  by time of admission  - Pharmacy med-rec is pending, will use hydralazine IVP's for now    5. Elevated troponin  - Troponin was 0.08 (nl <0.04) in the [place] ED  - There are no anginal complaints on admission and she denies any recent chest pain  - Check EKG, continue cardiac monitoring, trend troponin    6. Hyponatremia  - Serum sodium is 132 in ED in setting of hypovolemia  - She was given 500 cc NS bolus in ED and will be continued on IVF hydration  - Repeat chem panel    7. Nausea, vomiting, and diarrhea  - Patient reports several days of N/V/D without abdominal pain or bleeding  - At time of admission, pt reports no more vomiting or diarrhea in 24 hrs   - Exam is benign, LFT's wnl, this was likely viral  - Continue IVF hydration, monitor lytes     PPE: Gown, gloves, head cover, face shield, and mask worn in patient's room  DVT prophylaxis: sq heparin  Code Status: Full  Family Communication: Discussed with patient  Consults called: None  Admission status: Inpatient; PORT/PSI score is 96 which corresponds to 8-9% mortality and for which inpatient management is standard of care.    [name], MD [facility] Pager [phone]  If 7PM-7AM, please contact night-coverage [email or web address] Password [email or web address]  11/24/2018, 2:00 AM

## 2018-11-24 NOTE — Consult Note (Addendum)
   [facility] CM Inpatient Consult   11/24/2018  Shaka Zech 1941-04-21 [id number]   Notified by [place]. Patient is was active with [facility] prior to admission for chronic disease management services with a [facility] health Coach.   Our community based plan of care has focused on disease management and community resource support.   Chart review and entered from MD notes this morning as follows:  Valoree Agent is a 78 y.o. female with medical history significant for asthma, chronic kidney disease stage IV, hypertension, hyperlipidemia, insulin-dependent diabetes mellitus, and OSA, now presenting to the emergency department for evaluation of fevers, nausea, vomiting, diarrhea, and shortness of breath.  Patient reports that she developed the aforementioned symptoms just over 1 week ago and was evaluated by her PCP with influenza and COVID-19 testing on 11/18/2018.  She reports that the influenza PCR was negative and she was told to expect 14 days for COVID results.  Since that time, she has had ongoing subjective fevers, chills, and shortness of breath.  She had been experiencing nausea with vomiting and diarrhea, but reports that she has not had any of this in the past 24 hours.    With [employer] [[facility]] plan the patient will receive a post hospital transition of care from [facility] UM team  will be evaluated for assessments and disease process education by their team.      For additional questions please contact:  Natividad Brood, RN BSN [facility] Liaison  [phone] business mobile phone Toll free office [phone]

## 2018-11-25 LAB — GLUCOSE, CAPILLARY
Glucose-Capillary: 98 mg/dL (ref 70–99)
Glucose-Capillary: 145 mg/dL — ABNORMAL HIGH (ref 70–99)
Glucose-Capillary: 102 mg/dL — ABNORMAL HIGH (ref 70–99)
Glucose-Capillary: 95 mg/dL (ref 70–99)
Glucose-Capillary: 101 mg/dL — ABNORMAL HIGH (ref 70–99)

## 2018-11-25 LAB — CBC WITH DIFFERENTIAL/PLATELET
Abs Immature Granulocytes: 0.12 10*3/uL — ABNORMAL HIGH (ref 0.00–0.07)
Basophils Absolute: 0 10*3/uL (ref 0.0–0.1)
Basophils Relative: 0 %
Eosinophils Absolute: 0.2 10*3/uL (ref 0.0–0.5)
Eosinophils Relative: 1 %
HCT: 24.1 % — ABNORMAL LOW (ref 36.0–46.0)
Hemoglobin: 7.5 g/dL — ABNORMAL LOW (ref 12.0–15.0)
Immature Granulocytes: 1 %
Lymphocytes Relative: 10 %
Lymphs Abs: 1.2 10*3/uL (ref 0.7–4.0)
MCH: 23.8 pg — ABNORMAL LOW (ref 26.0–34.0)
MCHC: 31.1 g/dL (ref 30.0–36.0)
MCV: 76.5 fL — ABNORMAL LOW (ref 80.0–100.0)
Monocytes Absolute: 1.2 10*3/uL — ABNORMAL HIGH (ref 0.1–1.0)
Monocytes Relative: 10 %
Neutro Abs: 9 10*3/uL — ABNORMAL HIGH (ref 1.7–7.7)
Neutrophils Relative %: 78 %
Platelets: 178 10*3/uL (ref 150–400)
RBC: 3.15 MIL/uL — AB (ref 3.87–5.11)
RDW: 14.8 % (ref 11.5–15.5)
WBC: 11.7 10*3/uL — ABNORMAL HIGH (ref 4.0–10.5)
nRBC: 0 % (ref 0.0–0.2)

## 2018-11-25 LAB — BASIC METABOLIC PANEL
Anion gap: 10 (ref 5–15)
BUN: 71 mg/dL — ABNORMAL HIGH (ref 8–23)
CHLORIDE: 103 mmol/L (ref 98–111)
CO2: 21 mmol/L — ABNORMAL LOW (ref 22–32)
CREATININE: 3.26 mg/dL — AB (ref 0.44–1.00)
Calcium: 8 mg/dL — ABNORMAL LOW (ref 8.9–10.3)
GFR calc Af Amer: 15 mL/min — ABNORMAL LOW (ref >60)
GFR calc non Af Amer: 13 mL/min — ABNORMAL LOW (ref >60)
Glucose, Bld: 128 mg/dL — ABNORMAL HIGH (ref 70–99)
Potassium: 3.6 mmol/L (ref 3.5–5.1)
SODIUM: 134 mmol/L — AB (ref 135–145)

## 2018-11-25 LAB — PROCALCITONIN: Procalcitonin: 9.42 ng/mL

## 2018-11-25 MED ORDER — SODIUM CHLORIDE 0.9 % IV SOLN
INTRAVENOUS | Status: AC
  Administered 2018-11-26: 04:00:00 via INTRAVENOUS

## 2018-11-25 NOTE — TOC Initial Note (Addendum)
Transition of Care [facility]) - Initial/Assessment Note    Patient Details  Name: Leah Walsh MRN: [id number] Date of Birth: 08/10/1941  [facility]) CM/SW Contact:    Zenon Mayo, RN Phone Number: 11/25/2018, 10:13 AM  Clinical Narrative:                 From home with spouse, she has a PCP, she has no DME at home, [facility] , they deliver to here home, she stats she has plenty of lantus pens, but she only has 3 humalog pens .  She has been working with [facility] chronic care for insulin and has not had to pay for her insulin.  This NCM contacted [place] with [facility], she states to inform patient that the  [facility] Utilization Management and their concierge will follow up with her needs when she leaves the hospital. They will make referrals to [facility].  Expected Discharge Plan: Home/Self Care Barriers to Discharge: No Barriers Identified   Patient Goals and CMS Choice Patient states their goals for this hospitalization and ongoing recovery are:: get better   Choice offered to / list presented to : NA  Expected Discharge Plan and Services Expected Discharge Plan: Home/Self Care   Discharge Planning Services: CM Consult Post Acute Care Choice: NA Living arrangements for the past 2 months: Single Family Home                 DME Arranged: N/A DME Agency: NA HH Arranged: NA HH Agency: NA  Prior Living Arrangements/Services Living arrangements for the past 2 months: Single Family Home Lives with:: Spouse   Do you feel safe going back to the place where you live?: Yes            Criminal Activity/Legal Involvement Pertinent to Current Situation/Hospitalization: No - Comment as needed  Activities of Daily Living Home Assistive Devices/Equipment: None, Eyeglasses, Nebulizer, CBG Meter ADL Screening (condition at time of admission) Patient's cognitive ability adequate to safely complete daily activities?: Yes Is the patient deaf or have difficulty  hearing?: No Does the patient have difficulty seeing, even when wearing glasses/contacts?: No Does the patient have difficulty concentrating, remembering, or making decisions?: No Patient able to express need for assistance with ADLs?: Yes Does the patient have difficulty dressing or bathing?: No Independently performs ADLs?: Yes (appropriate for developmental age) Does the patient have difficulty walking or climbing stairs?: No Weakness of Legs: None Weakness of Arms/Hands: None  Permission Sought/Granted                  Emotional Assessment   Attitude/Demeanor/Rapport: Engaged Affect (typically observed): Accepting, Appropriate Orientation: : Oriented to Self, Oriented to Place, Oriented to  Time, Oriented to Situation   Psych Involvement: No (comment)  Admission diagnosis:  COVID-19 virus detected [U07.1] Pneumonia [J18.9] Renal failure [N19] Patient Active Problem List   Diagnosis Date Noted  . Acute renal failure superimposed on stage 4 chronic kidney disease ([place]) 11/24/2018  . CAP (community acquired pneumonia) 11/24/2018  . Hypertensive urgency 11/24/2018  . Hyponatremia 11/24/2018  . Nausea vomiting and diarrhea 11/24/2018  . Elevated troponin 11/24/2018  . Iron deficiency anemia 06/18/2018  . Coronary artery disease involving native coronary artery of native heart without angina pectoris 07/03/2017  . Pelvic mass 12/20/2016  . Hyperphosphatemia 08/22/2016  . Metabolic bone disease 84/16/6063  . Myocardial infarction ([place]) 03/12/2015  . GERD (gastroesophageal reflux disease) 10/07/2014  . Obstructive sleep apnea 08/04/2014  . Insomnia 08/04/2014  .  Dyspnea on exertion 08/04/2014  . Vitamin D deficiency 01/22/2013  . Microcytic anemia 01/02/2012  . Hypomagnesemia 01/02/2012  . Asthma, mild persistent 08/06/2011  . Hypertension 08/06/2011  . Insulin-requiring or dependent type II diabetes mellitus ([place]) 08/06/2011  . Atherosclerosis of renal artery ([place])  08/06/2011  . Chronic kidney disease, stage 4 (severe) ([place]) 08/06/2011  . History of gastroesophageal reflux (GERD) 08/06/2011   PCP:  [name], MD Pharmacy:   [place], [place] - [address] [place] [address] Phone: [phone] Fax: [phone]     Social Determinants of Health ([place]) Interventions    Readmission Risk Interventions No flowsheet data found.

## 2018-11-25 NOTE — Progress Notes (Addendum)
PROGRESS NOTE   Leah Walsh  ERD:[id number]    DOB: 10/31/1940    DOA: 11/24/2018  PCP: [name], MD   I have briefly reviewed patients previous medical records in [facility].  Brief Narrative:  78 year old married female, PMH of asthma, stage IV chronic kidney disease (follows with nephrology in [facility], baseline creatinine reportedly 1.8), HTN, HLD, IDDM, OSA presented to [facility] ED due to fever, nausea, vomiting, diarrhea and dyspnea.  She developed the symptoms a week PTA, evaluated by PCP 11/18/2018, influenza PCR reportedly negative but sent for Covid-19 testing.  Due to persisting symptoms, she presented to the ED.  She was transferred to [facility] for further evaluation. Covid-19 ruled out.  Assessment & Plan:   Principal Problem:   CAP (community acquired pneumonia) Active Problems:   Microcytic anemia   Asthma, mild persistent   Hypertension   Coronary artery disease involving native coronary artery of native heart without angina pectoris   Insulin-requiring or dependent type II diabetes mellitus ([place])   Acute renal failure superimposed on stage 4 chronic kidney disease (HCC)   Hypertensive urgency   Hyponatremia   Nausea vomiting and diarrhea   Elevated troponin   1. CAP, Covid-19 ruled out  - Presents with 1 wk of fever, SOB, and N/V/D without abdominal pain  - She was tested for COVID-19 by her PCP on 3/25 and results faxed in from PCPs office today, negative.  Discontinued all isolations. - She was afebrile in ED, saturating 90% on rm air, slightly tachypneic  - CXR in ED is concerning for RLL PNA, influenza PCR negative, there is leukocytosis to 11.8, procalcitonin 17.8, and lactate is normal  - She was started on Rocephin and azithromycin in ED  -Remains afebrile, leukocytosis improved, blood cultures x2: Negative to date.  Urine culture pending.  Procalcitonin down from 14-9.  Urine pneumococcal antigen negative. - Day 2 IV ceftriaxone and  azithromycin. -Will need follow-up chest x-ray in 4 weeks to ensure resolution of pneumonia findings.  2. Acute kidney injury superimposed on stage IV CKD  - SCr is 3.30 in ED, up from 1.81 in December  - Likely prerenal azotemia in setting of recent N/V/D; fortunately, vomiting and diarrhea appears to have resolved  - Check UA and FENa, renally-dose medications, avoid nephrotoxins, continue IVF hydration, and repeat chem panel   -Creatinine slightly better, down from 3.35-3.26.  Reduce and continue IV fluids for additional 24 hours.  Not oliguric.  Follows outpatient with nephrology in [facility].  3. Asthma  - No cough or wheezing on admission .  Stable without clinical bronchospasm. - Continue albuterol MDI as needed    4. Hypertension with hypertensive urgency  - SBP 170 in ED, increased to 200 by time of admission  - Controlled.  Continue carvedilol 12.5 mg twice daily, hydralazine 100 mg 3 times daily and nifedipine 90 mg daily.  Continue PRN meds.  5. Elevated troponin  - Troponin was 0.08 (nl <0.04) in the [place] ED  - There are no anginal complaints on admission and she denies any recent chest pain  - Likely related to acute on chronic kidney disease and acute illness.  6. Hyponatremia  - Serum sodium is 132 in ED in setting of hypovolemia  - She was given 500 cc NS bolus in ED and will be continued on IVF hydration  - Improved and stable.  7. Nausea, vomiting, and diarrhea  - Exam is benign, LFT's wnl, this was likely  viral  - Continue IVF hydration, monitor lytes   -Seems to have resolved and tolerating diet now.  8.  OP blood culture positive for gram-positive rod and anaerobic bottle (?  Contaminant): Blood culture sent from [facility], negative to date.  Follow OP final blood culture results.  9.  Anemia in CKD: Hemoglobin 9.6 in December 2019, now hemoglobin is dropped from 8.5 on admission to 7.5.  No overt bleeding.  Some of this could be dilutional and acute  illness.  Follow CBC in a.m.  Transfuse if hemoglobin 7 or less.  10.  Type II DM with renal complications: Reasonable inpatient control.  Continue current dose of Lantus and SSI.    DVT prophylaxis: Heparin Code Status: Full Family Communication: None at bedside Disposition: DC home pending clinical improvement including acute kidney injury.  Hopefully in the next 48 hours.   Consultants:  None  Procedures:  None  Antimicrobials:  IV ceftriaxone and azithromycin   Subjective: Patient reports feeling much better overall.  No further nausea, vomiting or diarrhea.  Able to eat for the first time this morning.  Generalized weakness.  Decreased cough, mostly dry.  No dyspnea reported.  No pain.  States that about the time of her hospitalization, her spouse fell, broke his leg and is supposed to have surgery on 4/14.  ROS: As above, otherwise negative.  Objective:  Vitals:   11/25/18 0500 11/25/18 0610 11/25/18 0752 11/25/18 1504  BP:  (!) 182/51 (!) 180/46 (!) 165/53  Pulse:  87 80 80  Resp:   16   Temp:   98.5 F (36.9 C) 98 F (36.7 C)  TempSrc:   Oral Oral  SpO2:  90% 92% 92%  Weight: 80.1 kg     Height:        Examination:  General exam: Pleasant elderly female, moderately built and nourished lying comfortably propped up in bed.  Oral mucosa with borderline hydration. Respiratory system: Clear to auscultation. Respiratory effort normal. Cardiovascular system: S1 & S2 heard, RRR. No JVD, murmurs, rubs, gallops or clicks. No pedal edema.  Telemetry personally reviewed: Sinus rhythm.  Transient episode of nonsustained SVT. Gastrointestinal system: Abdomen is nondistended, soft and nontender. No organomegaly or masses felt. Normal bowel sounds heard. Central nervous system: Alert and oriented. No focal neurological deficits. Extremities: Symmetric 5 x 5 power. Skin: No rashes, lesions or ulcers Psychiatry: Judgement and insight appear normal. Mood & affect  appropriate.     Data Reviewed: I have personally reviewed following labs and imaging studies  CBC: Recent Labs  Lab 11/24/18 0243 11/25/18 0304  WBC 15.7* 11.7*  NEUTROABS 12.8* 9.0*  HGB 8.5* 7.5*  HCT 27.4* 24.1*  MCV 76.8* 76.5*  PLT 175 242   Basic Metabolic Panel: Recent Labs  Lab 11/24/18 0243 11/25/18 0304  NA 132* 134*  K 4.0 3.6  CL 98 103  CO2 23 21*  GLUCOSE 266* 128*  BUN 71* 71*  CREATININE 3.35* 3.26*  CALCIUM 8.2* 8.0*   Liver Function Tests: Recent Labs  Lab 11/24/18 0243  AST 26  ALT 21  ALKPHOS 87  BILITOT 0.5  PROT 6.4*  ALBUMIN 2.7*   Cardiac Enzymes: Recent Labs  Lab 11/24/18 0243  TROPONINI 0.04*   CBG: Recent Labs  Lab 11/24/18 2129 11/25/18 0755 11/25/18 1113 11/25/18 1507 11/25/18 1617  GLUCAP 146* 98 145* 102* 95    Recent Results (from the past 240 hour(s))  Culture, blood (routine x 2)     Status:  None (Preliminary result)   Collection Time: 11/24/18  6:23 PM  Result Value Ref Range Status   Specimen Description BLOOD LEFT ANTECUBITAL  Final   Special Requests   Final    BOTTLES DRAWN AEROBIC AND ANAEROBIC Blood Culture adequate volume   Culture   Final    NO GROWTH < 24 HOURS Performed at [facility], [address] [place], [place] [address]    Report Status PENDING  Incomplete  Culture, blood (routine x 2)     Status: None (Preliminary result)   Collection Time: 11/24/18  6:49 PM  Result Value Ref Range Status   Specimen Description BLOOD LEFT HAND  Final   Special Requests   Final    BOTTLES DRAWN AEROBIC AND ANAEROBIC Blood Culture results may not be optimal due to an excessive volume of blood received in culture bottles   Culture   Final    NO GROWTH < 24 HOURS Performed at [facility], [place] [address], [place], [place] [address]    Report Status PENDING  Incomplete         Radiology Studies: No results found.      Scheduled Meds: . carvedilol  12.5 mg Oral BID WC  .  heparin  5,000 Units Subcutaneous Q8H  . hydrALAZINE  100 mg Oral Q8H  . insulin aspart  0-5 Units Subcutaneous QHS  . insulin aspart  0-9 Units Subcutaneous TID WC  . insulin glargine  15 Units Subcutaneous BID  . NIFEdipine  90 mg Oral Daily  . sodium chloride flush  3 mL Intravenous Q12H   Continuous Infusions: . sodium chloride 100 mL/hr at 11/25/18 1510  . azithromycin 500 mg (11/25/18 1112)  . cefTRIAXone (ROCEPHIN)  IV 1 g (11/25/18 1005)     LOS: 1 day     Vernell Leep, MD, FACP, [facility]. [facility]  To contact the attending provider between 7A-7P or the covering provider during after hours 7P-7A, please log into the web site [email or web address] and access using universal [place] password for that web site. If you do not have the password, please call the hospital operator.  11/25/2018, 5:06 PM

## 2018-11-25 NOTE — Progress Notes (Signed)
Report called to transfer unit 5W19.

## 2018-11-26 LAB — GLUCOSE, CAPILLARY
Glucose-Capillary: 63 mg/dL — ABNORMAL LOW (ref 70–99)
Glucose-Capillary: 98 mg/dL (ref 70–99)
Glucose-Capillary: 181 mg/dL — ABNORMAL HIGH (ref 70–99)
Glucose-Capillary: 144 mg/dL — ABNORMAL HIGH (ref 70–99)
Glucose-Capillary: 152 mg/dL — ABNORMAL HIGH (ref 70–99)
Glucose-Capillary: 93 mg/dL (ref 70–99)
Glucose-Capillary: 106 mg/dL — ABNORMAL HIGH (ref 70–99)

## 2018-11-26 LAB — CBC
HCT: 24.2 % — ABNORMAL LOW (ref 36.0–46.0)
Hemoglobin: 7.7 g/dL — ABNORMAL LOW (ref 12.0–15.0)
MCH: 24.4 pg — ABNORMAL LOW (ref 26.0–34.0)
MCHC: 31.8 g/dL (ref 30.0–36.0)
MCV: 76.6 fL — ABNORMAL LOW (ref 80.0–100.0)
Platelets: 263 10*3/uL (ref 150–400)
RBC: 3.16 MIL/uL — ABNORMAL LOW (ref 3.87–5.11)
RDW: 14.7 % (ref 11.5–15.5)
WBC: 13.3 10*3/uL — ABNORMAL HIGH (ref 4.0–10.5)
nRBC: 0 % (ref 0.0–0.2)

## 2018-11-26 LAB — RENAL FUNCTION PANEL
Albumin: 2.3 g/dL — ABNORMAL LOW (ref 3.5–5.0)
Anion gap: 9 (ref 5–15)
BUN: 70 mg/dL — ABNORMAL HIGH (ref 8–23)
CO2: 21 mmol/L — ABNORMAL LOW (ref 22–32)
Calcium: 7.8 mg/dL — ABNORMAL LOW (ref 8.9–10.3)
Chloride: 105 mmol/L (ref 98–111)
Creatinine, Ser: 3.2 mg/dL — ABNORMAL HIGH (ref 0.44–1.00)
GFR calc Af Amer: 16 mL/min — ABNORMAL LOW (ref >60)
GFR calc non Af Amer: 13 mL/min — ABNORMAL LOW (ref >60)
Glucose, Bld: 69 mg/dL — ABNORMAL LOW (ref 70–99)
Phosphorus: 4 mg/dL (ref 2.5–4.6)
Potassium: 3.2 mmol/L — ABNORMAL LOW (ref 3.5–5.1)
Sodium: 135 mmol/L (ref 135–145)

## 2018-11-26 MED ORDER — POTASSIUM CHLORIDE CRYS ER 20 MEQ PO TBCR
40.0000 meq | EXTENDED_RELEASE_TABLET | Freq: Once | ORAL | Status: AC
  Administered 2018-11-26: 40 meq via ORAL
  Filled 2018-11-26: qty 2

## 2018-11-26 MED ORDER — PANTOPRAZOLE SODIUM 40 MG PO TBEC
40.0000 mg | DELAYED_RELEASE_TABLET | Freq: Every day | ORAL | Status: DC
  Administered 2018-11-26 – 2018-12-03 (×8): 40 mg via ORAL
  Filled 2018-11-26 (×8): qty 1

## 2018-11-26 MED ORDER — HYDRALAZINE HCL 50 MG PO TABS
100.0000 mg | ORAL_TABLET | Freq: Three times a day (TID) | ORAL | Status: DC
  Administered 2018-11-26 – 2018-12-03 (×23): 100 mg via ORAL
  Filled 2018-11-26 (×25): qty 2

## 2018-11-26 MED ORDER — ISOSORBIDE MONONITRATE ER 30 MG PO TB24
30.0000 mg | ORAL_TABLET | Freq: Every day | ORAL | Status: DC
  Administered 2018-11-26 – 2018-11-28 (×3): 30 mg via ORAL
  Filled 2018-11-26 (×3): qty 1

## 2018-11-26 MED ORDER — NIFEDIPINE ER OSMOTIC RELEASE 60 MG PO TB24: 60.0000 mg | ORAL_TABLET | Freq: Every day | ORAL | Status: DC

## 2018-11-26 MED ORDER — CARVEDILOL 25 MG PO TABS
25.0000 mg | ORAL_TABLET | Freq: Two times a day (BID) | ORAL | Status: DC
  Administered 2018-11-26 – 2018-12-03 (×15): 25 mg via ORAL
  Filled 2018-11-26 (×15): qty 1

## 2018-11-26 MED ORDER — SERTRALINE HCL 25 MG PO TABS
25.0000 mg | ORAL_TABLET | Freq: Every day | ORAL | Status: DC
  Administered 2018-11-26 – 2018-12-03 (×8): 25 mg via ORAL
  Filled 2018-11-26 (×8): qty 1

## 2018-11-26 MED ORDER — INSULIN GLARGINE 100 UNIT/ML ~~LOC~~ SOLN
13.0000 [IU] | Freq: Two times a day (BID) | SUBCUTANEOUS | Status: DC
  Administered 2018-11-26 – 2018-12-03 (×14): 13 [IU] via SUBCUTANEOUS
  Filled 2018-11-26 (×18): qty 0.13

## 2018-11-26 MED ORDER — SERTRALINE HCL 20 MG/ML PO CONC
25.0000 mg | Freq: Every day | ORAL | Status: DC
  Filled 2018-11-26: qty 1.25

## 2018-11-26 MED ORDER — ALBUTEROL SULFATE (2.5 MG/3ML) 0.083% IN NEBU
2.5000 mg | INHALATION_SOLUTION | RESPIRATORY_TRACT | Status: DC | PRN
  Administered 2018-11-26 – 2018-11-29 (×2): 2.5 mg via RESPIRATORY_TRACT
  Filled 2018-11-26 (×2): qty 3

## 2018-11-26 NOTE — Evaluation (Addendum)
Physical Therapy Evaluation Patient Details Name: Leah Walsh MRN: [id number] DOB: 1940-10-05 Today's Date: 11/26/2018   History of Present Illness  78 yo F with history of sleep apnea, kidney disease, HTN, hyperlipidemia, heart attack, DM, asthma, ovarian tumor removal, carotid stent. Fell after vomiting at home and diagnosed with pneumonia.   Clinical Impression  Pt in bed on humidified 3L of O2 reporting she is not sleeping well because she is having a hard time breathing. Fell at home after vomiting and diarrhea but this is the only time she has fallen; she is otherwise independent in gait and other ADLs. Her husband is having surgery on his ankle per pt and she will need to take care of him. O2 stable around 95% on room air during ambulation and decreased to 89% standing still post walk but was able to increase with proper breathing. Will benefit from gross exercise for strength in functional ambulation and to practice stairs with PT instruction. Left in bed on 3L of humidified O2.  SATURATION QUALIFICATIONS: (This note is used to comply with regulatory documentation for home oxygen)  Patient Saturations on Room Air at Rest = 88%  Patient Saturations on Room Air while Ambulating = 95%  Patient Saturations on 0 Liters of oxygen while Ambulating = 95%  Please briefly explain why patient needs home oxygen: Drops to 88% at rest. Pt goal is to decrease need for O2, will continue to evaluate O2 saturation with functional activity.     Follow Up Recommendations Outpatient PT    Equipment Recommendations  None recommended by PT    Recommendations for Other Services       Precautions / Restrictions Precautions Precautions: Fall Precaution Comments: no history of fall other than with recent illness but did require minA to correct a LOB while ambulating today.  Restrictions Weight Bearing Restrictions: No      Mobility  Bed Mobility Overal bed mobility: Independent                 Transfers Overall transfer level: Needs assistance   Transfers: Sit to/from Stand Sit to Stand: Min guard         General transfer comment: fatigues quickly but aware of need to sit and rest  Ambulation/Gait Ambulation/Gait assistance: Min assist Gait Distance (Feet): 60 Feet Assistive device: None       General Gait Details: unstable with fatigue but aware to stop and rest  Stairs            Wheelchair Mobility    Modified Rankin (Stroke Patients Only)       Balance Overall balance assessment: Needs assistance   Sitting balance-Leahy Scale: Normal     Standing balance support: Single extremity supported Standing balance-Leahy Scale: Fair                               Pertinent Vitals/Pain Pain Assessment: No/denies pain    Home Living Family/patient expects to be discharged to:: Private residence Living Arrangements: Spouse/significant other Available Help at Discharge: Family Type of Home: House Home Access: Stairs to enter   Technical brewer of Steps: 6-10 Home Layout: One level Home Equipment: None      Prior Function Level of Independence: Independent               Hand Dominance        Extremity/Trunk Assessment   Upper Extremity Assessment Upper Extremity Assessment: Overall WFL for tasks  assessed    Lower Extremity Assessment Lower Extremity Assessment: Generalized weakness    Cervical / Trunk Assessment Cervical / Trunk Assessment: Kyphotic  Communication   Communication: No difficulties  Cognition Arousal/Alertness: Awake/alert Behavior During Therapy: WFL for tasks assessed/performed Overall Cognitive Status: Within Functional Limits for tasks assessed                                        General Comments      Exercises     Assessment/Plan    PT Assessment Patient needs continued PT services  PT Problem List Decreased strength;Decreased activity  tolerance;Cardiopulmonary status limiting activity;Decreased balance       PT Treatment Interventions Therapeutic activities;Gait training;Therapeutic exercise;Patient/family education;Stair training;Neuromuscular re-education    PT Goals (Current goals can be found in the Care Plan section)  Acute Rehab PT Goals Patient Stated Goal: not be on oxygen PT Goal Formulation: With patient Time For Goal Achievement: 12/10/18 Potential to Achieve Goals: Good    Frequency Min 2X/week   Barriers to discharge   husband is currently in  hospital for surgery on ankle per pt    Co-evaluation               AM-PAC PT "6 Clicks" Mobility  Outcome Measure Help needed turning from your back to your side while in a flat bed without using bedrails?: None Help needed moving from lying on your back to sitting on the side of a flat bed without using bedrails?: None Help needed moving to and from a bed to a chair (including a wheelchair)?: A Little Help needed standing up from a chair using your arms (e.g., wheelchair or bedside chair)?: A Little Help needed to walk in hospital room?: A Little Help needed climbing 3-5 steps with a railing? : A Lot 6 Click Score: 19    End of Session Equipment Utilized During Treatment: Gait belt Activity Tolerance: Patient tolerated treatment well;Patient limited by fatigue Patient left: in bed;with call bell/phone within reach   PT Visit Diagnosis: Unsteadiness on feet (R26.81);Other abnormalities of gait and mobility (R26.89);Muscle weakness (generalized) (M62.81);Difficulty in walking, not elsewhere classified (R26.2)    Time: 1245-8099 PT Time Calculation (min) (ACUTE ONLY): 17 min   Charges:   PT Evaluation $PT Eval Moderate Complexity: 1 Mod PT Treatments $Therapeutic Activity: 8-22 mins        Selinda Eon PT, DPT Acute Rehab [phone]

## 2018-11-26 NOTE — Progress Notes (Signed)
0745: Message to physician. Potassium 3.2  0753:BG 63. 151ml juice provided with graham crackers.

## 2018-11-26 NOTE — Progress Notes (Signed)
Patient Demographics:    Leah Walsh, is a 78 y.o. female, DOB - September 07, 1940, VQX:[id number]  Admit date - 11/24/2018   Admitting Physician [name], MD  Outpatient Primary MD for the patient is [name], MD  LOS - 2   No chief complaint on file.       Subjective:    Leah Walsh today has no fevers, no emesis,  No chest pain, cough AND shortness of breath continues to improve  Assessment  & Plan :    Principal Problem:   CAP (community acquired pneumonia) Active Problems:   Microcytic anemia   Asthma, mild persistent   Hypertension   Coronary artery disease involving native coronary artery of native heart without angina pectoris   Insulin-requiring or dependent type II diabetes mellitus ([place])   Acute renal failure superimposed on stage 4 chronic kidney disease (HCC)   Hypertensive urgency   Hyponatremia   Nausea vomiting and diarrhea   Elevated troponin   Brief Narrative:  78 year old married female, PMH of asthma, stage IV chronic kidney disease (follows with nephrology in [facility], baseline creatinine reportedly 1.8), HTN, HLD, IDDM, OSA presented to [facility] ED due to fever, nausea, vomiting, diarrhea and dyspnea.  She developed the symptoms a week PTA, evaluated by PCP 11/18/2018, influenza PCR reportedly negative but sent for Covid-19 testing.  Due to persisting symptoms, she presented to the ED.  She was transferred to [facility] for further evaluation. Covid-19 ruled out.   1.CAP,/RLL PNA--- Covid-19 ruled out -Presents with 1 wk of fever, SOB, and N/V/D without abdominal pain -She was tested for COVID-19 by her PCP on 3/25 and results are Negative faxed in from PCPs so we  Discontinued all isolations. Hypoxia persist, requiring up to 3 L of oxygen via nasal cannula, PTA patient was not on home O2 CXR in ED was concerning for RLL PNA, influenza PCR negative,  -She  was started on Rocephin and azithromycin in ED -Remains afebrile, leukocytosis improved, blood cultures x2: Negative to date. ... procalcitonin down from 14 to 9.  Urine pneumococcal antigen negative. -c/n  IV ceftriaxone and azithromycin. -Will need follow-up chest x-ray in 4 weeks to ensure resolution of pneumonia findings.  2.Acute kidney injury superimposed on stage IV CKD -SCr is 3.30 in ED, up from 1.81 in December -Likely prerenal azotemia in setting of recent N/V/D; fortunately, vomiting and diarrhea appears to have resolved -Check UA and FENa, renally-dose medications, avoid nephrotoxins, continue IVF hydration, and repeat chem panel -Creatinine slightly better, down from 3.20,   Not oliguric.  Follows outpatient with nephrology in [facility].  3.HTN--- BP is not at goal, increase Coreg to 25 mg p.o. twice daily, continue hydralazine 100 mg 3 times daily, continue Procardia XL 90 mg daily,, IV labetalol as needed elevated BP continue to hold losartan/HCTZ due to kidney concerns  4)DM2--episodes of hypoglycemia, oral intake is not great, reduce Lantus insulin to 13 units twice daily from 15 units  5.Elevated troponin -Troponin was 0.08 (nl <0.04) in the [facility] -There are no anginal complaints on admission and she denies any recent chest pain -Likely related to acute on chronic kidney disease and acute illness.  6.Nausea, vomiting, and diarrhea--- patient with hyponatremia and hypokalemia  due to GI losses, sodium is normalized, okay to replace potassium  7)  OP blood culture positive for gram-positive rod and anaerobic bottle (?  Contaminant): Blood culture sent from [facility], negative to date.  Follow OP final blood culture results.  8)  Anemia in CKD: Hemoglobin 9.6 in December 2019, now hemoglobin is dropped from 8.5 on admission to 7.7  No overt bleeding.  Some of this could be dilutional and acute illness.  Follow CBC in a.m.  Transfuse if hemoglobin 7 or  less.  9) Type II DM with renal complications: Reasonable inpatient control.  Continue current dose of Lantus and SSI.  10) Urine culture with enterococcus, sensitivity pending --- continue IV Rocephin pending sensitivities  11) acute hypoxic respiratory failure--- secondary to pneumonia as above #1, if unable to wean off O2 patient may have to be discharged with oxygen  12) generalized weakness/debility--- patient having difficulties with mobility related activities of daily living due to hypoxia... Get PT eval  13)Depression--- restart Zoloft at 25 mg daily  Disposition/Need for in-Hospital Stay- patient unable to be discharged at this time due to pending enterococcus faecalis sensitivity, also persistent hypoxia, respiratory symptoms, awaiting PT eval for disposition  Code Status : Full   Family Communication:   na   Disposition Plan  : Await PT eval  Consults  :  na  DVT Prophylaxis  :- Heparin -  Lab Results  Component Value Date   PLT 263 11/26/2018    Inpatient Medications  Scheduled Meds: . carvedilol  12.5 mg Oral BID WC  . heparin  5,000 Units Subcutaneous Q8H  . hydrALAZINE  100 mg Oral Q8H  . hydrALAZINE  100 mg Oral TID  . insulin aspart  0-5 Units Subcutaneous QHS  . insulin aspart  0-9 Units Subcutaneous TID WC  . insulin glargine  13 Units Subcutaneous BID  . NIFEdipine  60 mg Oral Daily  . NIFEdipine  90 mg Oral Daily  . sodium chloride flush  3 mL Intravenous Q12H   Continuous Infusions: . sodium chloride 75 mL/hr at 11/26/18 0728  . azithromycin Stopped (11/25/18 1253)  . cefTRIAXone (ROCEPHIN)  IV Stopped (11/25/18 1041)   PRN Meds:.acetaminophen **OR** acetaminophen, albuterol, HYDROcodone-acetaminophen, labetalol, Muscle Rub, ondansetron **OR** ondansetron (ZOFRAN) IV    Anti-infectives (From admission, onward)   Start     Dose/Rate Route Frequency Ordered Stop   11/25/18 1000  hydroxychloroquine (PLAQUENIL) tablet 200 mg  Status:   Discontinued     200 mg Oral 2 times daily 11/24/18 1314 11/24/18 1612   11/24/18 1315  hydroxychloroquine (PLAQUENIL) tablet 400 mg  Status:  Discontinued     400 mg Oral 2 times daily 11/24/18 1314 11/24/18 1612   11/24/18 1000  cefTRIAXone (ROCEPHIN) 1 g in sodium chloride 0.9 % 100 mL IVPB     1 g 200 mL/hr over 30 Minutes Intravenous Daily 11/24/18 0200 11/30/18 0959   11/24/18 1000  azithromycin (ZITHROMAX) 500 mg in sodium chloride 0.9 % 250 mL IVPB     500 mg 250 mL/hr over 60 Minutes Intravenous Every 24 hours 11/24/18 0200 11/30/18 0959        Objective:   Vitals:   11/26/18 0100 11/26/18 0500 11/26/18 0508 11/26/18 0943  BP:   (!) 177/50 (!) 193/61  Pulse:   79 91  Resp:      Temp:   98.1 F (36.7 C) 98 F (36.7 C)  TempSrc:   Oral Oral  SpO2: 93%  96% 92%  Weight:  79.1 kg    Height:        Wt Readings from Last 3 Encounters:  11/26/18 79.1 kg  04/29/18 79.9 kg  04/29/17 79.3 kg     Intake/Output Summary (Last 24 hours) at 11/26/2018 0944 Last data filed at 11/26/2018 2426 Gross per 24 hour  Intake 1864.9 ml  Output 200 ml  Net 1664.9 ml   Physical Exam Patient is examined daily including today on 11/26/18 , exams remain the same as of yesterday except that has changed   Gen:- Awake Alert,  In no apparent distress  HEENT:- [place].AT, No sclera icterus Nose- 3L/min Neck-Supple Neck,No JVD,.  Lungs-no significant wheezing, air movement is fair and symmetrical  CV- S1, S2 normal, regular  Abd-  +ve B.Sounds, Abd Soft, No tenderness,    Extremity/Skin:- No  edema, pedal pulses present  Psych-affect is appropriate, oriented x3 Neuro-no new focal deficits, no tremors   Data Review:   Micro Results Recent Results (from the past 240 hour(s))  Culture, blood (routine x 2)     Status: None (Preliminary result)   Collection Time: 11/24/18  6:23 PM  Result Value Ref Range Status   Specimen Description BLOOD LEFT ANTECUBITAL  Final   Special Requests   Final     BOTTLES DRAWN AEROBIC AND ANAEROBIC Blood Culture adequate volume   Culture   Final    NO GROWTH < 24 HOURS Performed at [facility], [place] [address] [place], [place] [address]    Report Status PENDING  Incomplete  Culture, blood (routine x 2)     Status: None (Preliminary result)   Collection Time: 11/24/18  6:49 PM  Result Value Ref Range Status   Specimen Description BLOOD LEFT HAND  Final   Special Requests   Final    BOTTLES DRAWN AEROBIC AND ANAEROBIC Blood Culture results may not be optimal due to an excessive volume of blood received in culture bottles   Culture   Final    NO GROWTH < 24 HOURS Performed at [facility], [place] [address], [place], [place] [address]    Report Status PENDING  Incomplete  Culture, Urine     Status: None (Preliminary result)   Collection Time: 11/24/18 11:28 PM  Result Value Ref Range Status   Specimen Description URINE, RANDOM  Final   Special Requests NONE  Final   Culture   Final    CULTURE REINCUBATED FOR BETTER GROWTH Performed at [facility], [place] [address], [place],  [address]    Report Status PENDING  Incomplete    Radiology Reports No results found.   CBC Recent Labs  Lab 11/24/18 0243 11/25/18 0304 11/26/18 0432  WBC 15.7* 11.7* 13.3*  HGB 8.5* 7.5* 7.7*  HCT 27.4* 24.1* 24.2*  PLT 175 178 263  MCV 76.8* 76.5* 76.6*  MCH 23.8* 23.8* 24.4*  MCHC 31.0 31.1 31.8  RDW 14.7 14.8 14.7  LYMPHSABS 0.9 1.2  --   MONOABS 1.6* 1.2*  --   EOSABS 0.1 0.2  --   BASOSABS 0.0 0.0  --     Chemistries  Recent Labs  Lab 11/24/18 0243 11/25/18 0304 11/26/18 0432  NA 132* 134* 135  K 4.0 3.6 3.2*  CL 98 103 105  CO2 23 21* 21*  GLUCOSE 266* 128* 69*  BUN 71* 71* 70*  CREATININE 3.35* 3.26* 3.20*  CALCIUM 8.2* 8.0* 7.8*  AST 26  --   --   ALT  21  --   --   ALKPHOS 87  --   --   BILITOT 0.5  --   --     ------------------------------------------------------------------------------------------------------------------ No results for input(s): CHOL, HDL, LDLCALC, TRIG, CHOLHDL, LDLDIRECT in the last 72 hours.  No results found for: HGBA1C ------------------------------------------------------------------------------------------------------------------ No results for input(s): TSH, T4TOTAL, T3FREE, THYROIDAB in the last 72 hours.  Invalid input(s): FREET3 ------------------------------------------------------------------------------------------------------------------ Recent Labs    11/24/18 0937  FERRITIN 74    Coagulation profile No results for input(s): INR, PROTIME in the last 168 hours.  No results for input(s): DDIMER in the last 72 hours.  Cardiac Enzymes Recent Labs  Lab 11/24/18 0243  TROPONINI 0.04*   ------------------------------------------------------------------------------------------------------------------ No results found for: BNP   Roxan Hockey M.D on 11/26/2018 at 9:44 AM  Go to [email or web address] - for contact info  [facility] - Office  [phone]

## 2018-11-27 ENCOUNTER — Inpatient Hospital Stay (HOSPITAL_COMMUNITY): Payer: PPO

## 2018-11-27 DIAGNOSIS — I2510 Atherosclerotic heart disease of native coronary artery without angina pectoris: Secondary | ICD-10-CM

## 2018-11-27 LAB — BASIC METABOLIC PANEL
Anion gap: 8 (ref 5–15)
BUN: 65 mg/dL — ABNORMAL HIGH (ref 8–23)
CO2: 20 mmol/L — ABNORMAL LOW (ref 22–32)
Calcium: 8 mg/dL — ABNORMAL LOW (ref 8.9–10.3)
Chloride: 106 mmol/L (ref 98–111)
Creatinine, Ser: 3.14 mg/dL — ABNORMAL HIGH (ref 0.44–1.00)
GFR calc Af Amer: 16 mL/min — ABNORMAL LOW (ref >60)
GFR calc non Af Amer: 14 mL/min — ABNORMAL LOW (ref >60)
Glucose, Bld: 231 mg/dL — ABNORMAL HIGH (ref 70–99)
Potassium: 4.5 mmol/L (ref 3.5–5.1)
Sodium: 134 mmol/L — ABNORMAL LOW (ref 135–145)

## 2018-11-27 LAB — CBC
HCT: 22.7 % — ABNORMAL LOW (ref 36.0–46.0)
Hemoglobin: 7 g/dL — ABNORMAL LOW (ref 12.0–15.0)
MCH: 23.7 pg — ABNORMAL LOW (ref 26.0–34.0)
MCHC: 30.8 g/dL (ref 30.0–36.0)
MCV: 76.9 fL — ABNORMAL LOW (ref 80.0–100.0)
Platelets: 254 10*3/uL (ref 150–400)
RBC: 2.95 MIL/uL — ABNORMAL LOW (ref 3.87–5.11)
RDW: 15.1 % (ref 11.5–15.5)
WBC: 11.8 10*3/uL — ABNORMAL HIGH (ref 4.0–10.5)
nRBC: 0 % (ref 0.0–0.2)

## 2018-11-27 LAB — GLUCOSE, CAPILLARY
Glucose-Capillary: 115 mg/dL — ABNORMAL HIGH (ref 70–99)
Glucose-Capillary: 195 mg/dL — ABNORMAL HIGH (ref 70–99)
Glucose-Capillary: 168 mg/dL — ABNORMAL HIGH (ref 70–99)
Glucose-Capillary: 173 mg/dL — ABNORMAL HIGH (ref 70–99)

## 2018-11-27 LAB — BLOOD GAS, ARTERIAL
Acid-base deficit: 3.8 mmol/L — ABNORMAL HIGH (ref 0.0–2.0)
Bicarbonate: 20.1 mmol/L (ref 20.0–28.0)
Drawn by: 545251
O2 Content: 10 L/min
O2 Saturation: 90.4 %
Patient temperature: 98.6
RATE: 22 resp/min
pCO2 arterial: 32.5 mmHg (ref 32.0–48.0)
pH, Arterial: 7.408 (ref 7.350–7.450)
pO2, Arterial: 59 mmHg — ABNORMAL LOW (ref 83.0–108.0)

## 2018-11-27 LAB — URINE CULTURE

## 2018-11-27 LAB — PROCALCITONIN: Procalcitonin: 2.14 ng/mL

## 2018-11-27 LAB — BRAIN NATRIURETIC PEPTIDE: B Natriuretic Peptide: 427.5 pg/mL — ABNORMAL HIGH (ref 0.0–100.0)

## 2018-11-27 MED ORDER — FUROSEMIDE 10 MG/ML IJ SOLN
40.0000 mg | Freq: Once | INTRAMUSCULAR | Status: AC
  Administered 2018-11-27: 10:00:00 40 mg via INTRAVENOUS

## 2018-11-27 MED ORDER — FUROSEMIDE 10 MG/ML IJ SOLN
INTRAMUSCULAR | Status: AC
  Filled 2018-11-27: qty 4

## 2018-11-27 MED ORDER — IPRATROPIUM-ALBUTEROL 0.5-2.5 (3) MG/3ML IN SOLN
3.0000 mL | RESPIRATORY_TRACT | Status: DC | PRN
  Administered 2018-11-27 – 2018-11-30 (×2): 3 mL via RESPIRATORY_TRACT
  Filled 2018-11-27 (×2): qty 3

## 2018-11-27 MED ORDER — FUROSEMIDE 10 MG/ML IJ SOLN
40.0000 mg | Freq: Once | INTRAMUSCULAR | Status: AC
  Administered 2018-11-27: 14:00:00 40 mg via INTRAVENOUS
  Filled 2018-11-27: qty 4

## 2018-11-27 MED ORDER — AMOXICILLIN-POT CLAVULANATE 500-125 MG PO TABS: 1.0000 | ORAL_TABLET | Freq: Two times a day (BID) | ORAL | Status: DC

## 2018-11-27 MED ORDER — AMOXICILLIN-POT CLAVULANATE 500-125 MG PO TABS
1.0000 | ORAL_TABLET | Freq: Two times a day (BID) | ORAL | Status: AC
  Administered 2018-11-27 – 2018-11-29 (×6): 500 mg via ORAL
  Filled 2018-11-27 (×6): qty 1

## 2018-11-27 MED ORDER — AZITHROMYCIN 500 MG PO TABS
500.0000 mg | ORAL_TABLET | Freq: Once | ORAL | Status: AC
  Administered 2018-11-28: 09:00:00 500 mg via ORAL
  Filled 2018-11-27: qty 1

## 2018-11-27 NOTE — Progress Notes (Addendum)
Patient Demographics:    Leah Walsh, is a 78 y.o. female, DOB - 19-Mar-1941, VVO:[id number]  Admit date - 11/24/2018   Admitting Physician [name], MD  Outpatient Primary MD for the patient is [name], MD  LOS - 3   No chief complaint on file.       Subjective:    [name] today has no fevers, no emesis,  No chest pain, cough AND shortness of breath has worsened......Marland Kitchenhypoxia is worse  Assessment  & Plan :    Principal Problem:   CAP (community acquired pneumonia) Active Problems:   Microcytic anemia   Asthma, mild persistent   Hypertension   Coronary artery disease involving native coronary artery of native heart without angina pectoris   Insulin-requiring or dependent type II diabetes mellitus (HCC)   Acute renal failure superimposed on stage 4 chronic kidney disease (HCC)   Hypertensive urgency   Hyponatremia   Nausea vomiting and diarrhea   Elevated troponin   Brief Narrative:  78 year old married female, PMH of asthma, stage IV chronic kidney disease (follows with nephrology in [facility], baseline creatinine reportedly 1.8), HTN, HLD, IDDM, OSA presented to [facility] ED due to fever, nausea, vomiting, diarrhea and dyspnea.  She developed the symptoms a week PTA, evaluated by PCP 11/18/2018, influenza PCR reportedly negative but sent for Covid-19 testing.  Due to persisting symptoms, she presented to the ED.  She was transferred to [facility] for further evaluation. Covid-19 ruled out.   Plan:- 1.CAP,/RLL PNA--- Covid-19 ruled out -Presents with 1 wk of fever, SOB, and N/V/D without abdominal pain -She was tested for COVID-19 by her PCP on 3/25 and results are Negative faxed in from PCPs so we  Discontinued all COVID related isolations. PTA patient was not on home O2--- hypoxia has worsened, unable to do echo given COVID with related limitations---  patient now  appears volume overloaded/CHF pattern, oxygen requirement is worsened, patient now on 10 L of oxygen with increased work of breathing, ----IV Lasix as ordered, fluid input and output monitoring, daily weights, may need BiPAP ... BNP is 427.5, chest x-ray and clinical exam suggest volume overload/CHF,. Diuresis may be challenging given poor renal function  CXR in ED was concerning for RLL PNA, influenza PCR negative,  -  -Remains afebrile, leukocytosis improved, blood cultures x2: Negative to date. ... procalcitonin down from 14 to 2.1.  Urine pneumococcal antigen negative. -Treated with IV ceftriaxone and azithromycin from 11/23/18 (started at [facility]) thru 11/27/18 -Will need follow-up chest x-ray in 4 weeks to ensure resolution of pneumonia findings.  2.Acute kidney injury superimposed on stage IV CKD -SCr is 3.30 on admission, up from 1.81 in December -Likely prerenal azotemia in setting of recent N/V/D; fortunately, vomiting and diarrhea appears to have resolved  renally-dose medications, avoid nephrotoxins,    Follows outpatient with nephrology in [facility], monitor renal function closely with IV diuresis.  3.HTN--- continue Coreg at increased dose of  25 mg p.o. twice daily, continue hydralazine 100 mg 3 times daily, continue Procardia XL 90 mg daily,, IV labetalol as needed elevated BP continue to hold losartan/HCTZ due to kidney concerns  4)DM2--episodes of hypoglycemia, oral intake is not great,  c/n Lantus insulin at reduced dose of  13 units twice daily from 15 units  5.Elevated troponin -Troponin was 0.08 (nl <0.04) in the [facility] ED..... Troponin elevation not consistent with ACS pattern, unable to do echocardiogram to rule out regional wall motion abnormalities or evaluate EF  Due to COVID related situation -There are no anginal complaints on admission and she denies any recent chest pain -Likely related to acute on chronic kidney disease and acute illness.  6.Nausea,  vomiting, and diarrhea--- patient with hyponatremia and hypokalemia due to GI losses, sodium is normalized, okay to replace potassium  7)  OP blood culture positive for gram-positive rod and anaerobic bottle (?  Contaminant): Blood culture sent from [facility] on 11/24/18, negative to date.  -- patient was treated with IV Rocephin and IV azithromycin starting 11/23/2018 at [facility], Rocephin and azithromycin was continued from 3/31/202o to  11/27/2018 here at [facility], plan to switch to p.o. Augmentin on 11/27/2018 to allow for coverage of enterococcus in the urine as well  8)  Anemia in CKD: Hemoglobin 9.6 in December 2019, now hemoglobin is dropped from 8.5 on admission to 7.0  No overt bleeding.  Some of this could be dilutional and acute illness.  Follow CBC in a.m.  Transfuse if hemoglobin 7 or less....... drifting hemoglobin may be hemodilution iron in the setting of volume overload/CHF..... Patient may benefit from EPO/Procrit given anemia of CKD  9) Type II DM with renal complications: Reasonable inpatient control.  Continue current dose of Lantus and SSI.  10) Urine culture with enterococcus- treat empirically with Augmentin as ordered  11)Acute Hypoxic Respiratory failure--- initially secondary to PNA, patient now appears volume overloaded/CHF pattern, oxygen requirement is worsened, patient now on 10 L of oxygen with increased work of breathing, ----IV Lasix as ordered, fluid input and output monitoring, daily weights, may need BiPAP .[name] ABG with PO2 of 59 on 10 L of oxygen  12)Generalized weakness/debility--- patient having difficulties with mobility related activities of daily living due to hypoxia... Get PT eval when respiratory status improves  13)Depression--- stable, continue Zoloft at 25 mg daily  14)Volume Overload/CHF----EF not available, Diastolic Vs Systolic , hypoxia has worsened, unable to do echo given COVID with related limitations---  patient now appears volume  overloaded/CHF pattern, oxygen requirement is worsened, patient now on 10 L of oxygen with increased work of breathing, ----IV Lasix as ordered, fluid input and output monitoring, daily weights, may need BiPAP ... BNP is 427.5, chest x-ray and clinical exam suggest volume overload/CHF,. Diuresis may be challenging given poor renal function   Disposition/Need for in-Hospital Stay- patient unable to be discharged at this time due to worsening respiratory failure with worsening hypoxia,   persistent and worsening hypoxia, respiratory symptoms, unable to do PT eval for disposition due to worsening respiratory status  Code Status : Full   Family Communication:   na   Disposition Plan  : Hold off on PT eval pending improvement in respiratory status  Consults  :  na  DVT Prophylaxis  :- Heparin -  Lab Results  Component Value Date   PLT 254 11/27/2018    Inpatient Medications  Scheduled Meds: . amoxicillin-clavulanate  1 tablet Oral Q12H  . [START ON 11/28/2018] azithromycin  500 mg Oral Once  . carvedilol  25 mg Oral BID WC  . furosemide      . heparin  5,000 Units Subcutaneous Q8H  . hydrALAZINE  100 mg Oral TID  . insulin aspart  0-5 Units Subcutaneous QHS  . insulin aspart  0-9 Units Subcutaneous TID WC  . insulin glargine  13 Units Subcutaneous BID  . isosorbide mononitrate  30 mg Oral Daily  . NIFEdipine  90 mg Oral Daily  . pantoprazole  40 mg Oral Daily  . sertraline  25 mg Oral Daily  . sodium chloride flush  3 mL Intravenous Q12H   Continuous Infusions:  PRN Meds:.acetaminophen **OR** acetaminophen, albuterol, HYDROcodone-acetaminophen, ipratropium-albuterol, labetalol, Muscle Rub, ondansetron **OR** ondansetron (ZOFRAN) IV    Anti-infectives (From admission, onward)   Start     Dose/Rate Route Frequency Ordered Stop   11/28/18 1000  amoxicillin-clavulanate (AUGMENTIN) 500-125 MG per tablet 500 mg  Status:  Discontinued     1 tablet Oral Every 12 hours 11/27/18 1428  11/27/18 1450   11/28/18 1000  azithromycin (ZITHROMAX) tablet 500 mg     500 mg Oral  Once 11/27/18 1428     11/27/18 1500  amoxicillin-clavulanate (AUGMENTIN) 500-125 MG per tablet 500 mg     1 tablet Oral Every 12 hours 11/27/18 1450 11/30/18 0959   11/25/18 1000  hydroxychloroquine (PLAQUENIL) tablet 200 mg  Status:  Discontinued     200 mg Oral 2 times daily 11/24/18 1314 11/24/18 1612   11/24/18 1315  hydroxychloroquine (PLAQUENIL) tablet 400 mg  Status:  Discontinued     400 mg Oral 2 times daily 11/24/18 1314 11/24/18 1612   11/24/18 1000  cefTRIAXone (ROCEPHIN) 1 g in sodium chloride 0.9 % 100 mL IVPB  Status:  Discontinued     1 g 200 mL/hr over 30 Minutes Intravenous Daily 11/24/18 0200 11/27/18 1426   11/24/18 1000  azithromycin (ZITHROMAX) 500 mg in sodium chloride 0.9 % 250 mL IVPB  Status:  Discontinued     500 mg 250 mL/hr over 60 Minutes Intravenous Every 24 hours 11/24/18 0200 11/27/18 1426        Objective:   Vitals:   11/27/18 0327 11/27/18 0411 11/27/18 0431 11/27/18 0830  BP:   (!) 162/53   Pulse: 80 82 82 88  Resp: 20 20 20  (!) 22  Temp: 98.1 F (36.7 C)  97.8 F (36.6 C)   TempSrc: Oral     SpO2: 94% (!) 89% 92% 91%  Weight:      Height:        Wt Readings from Last 3 Encounters:  11/26/18 79.1 kg  04/29/18 79.9 kg  04/29/17 79.3 kg     Intake/Output Summary (Last 24 hours) at 11/27/2018 1450 Last data filed at 11/27/2018 1355 Gross per 24 hour  Intake 590 ml  Output 350 ml  Net 240 ml   Physical Exam Patient is examined daily including today on 11/27/18 , exams remain the same as of yesterday except that has changed   Gen:- Awake Alert, patient intermittently with increased work of breathing HEENT:- [place].AT, No sclera icterus Nose- 10L/min Neck-Supple Neck, Lungs-no significant wheezing, diminished air movement right more than left, CV- S1, S2 normal, regular  Abd-  +ve B.Sounds, Abd Soft, No tenderness,    Extremity/Skin:- trace edema, pedal  pulses present  Psych-affect is appropriate, oriented x3 Neuro-no new focal deficits, no tremors   Data Review:   Micro Results Recent Results (from the past 240 hour(s))  Culture, blood (routine x 2)     Status: None (Preliminary result)   Collection Time: 11/24/18  6:23 PM  Result Value Ref Range Status   Specimen Description BLOOD LEFT ANTECUBITAL  Final   Special Requests   Final    BOTTLES DRAWN  AEROBIC AND ANAEROBIC Blood Culture adequate volume   Culture   Final    NO GROWTH 3 DAYS Performed at [facility], [place] [address], [place], [place] [address]    Report Status PENDING  Incomplete  Culture, blood (routine x 2)     Status: None (Preliminary result)   Collection Time: 11/24/18  6:49 PM  Result Value Ref Range Status   Specimen Description BLOOD LEFT HAND  Final   Special Requests   Final    BOTTLES DRAWN AEROBIC AND ANAEROBIC Blood Culture results may not be optimal due to an excessive volume of blood received in culture bottles   Culture   Final    NO GROWTH 3 DAYS Performed at [facility], [place] [address] [place], [place] [address]    Report Status PENDING  Incomplete  Culture, Urine     Status: Abnormal   Collection Time: 11/24/18 11:28 PM  Result Value Ref Range Status   Specimen Description URINE, RANDOM  Final   Special Requests   Final    NONE Performed at [facility], [place] [address] [place], [place] [address]    Culture 20,000 COLONIES/mL ENTEROCOCCUS FAECALIS (A)  Final   Report Status 11/27/2018 FINAL  Final   Organism ID, Bacteria ENTEROCOCCUS FAECALIS (A)  Final      Susceptibility   Enterococcus faecalis - MIC*    AMPICILLIN <=2 SENSITIVE Sensitive     LEVOFLOXACIN 1 SENSITIVE Sensitive     NITROFURANTOIN <=16 SENSITIVE Sensitive     VANCOMYCIN 1 SENSITIVE Sensitive     * 20,000 COLONIES/mL ENTEROCOCCUS FAECALIS    Radiology Reports Dg Chest Port 1 View  Result Date: 11/27/2018 CLINICAL DATA:  Increasing oxygen demand  EXAM: PORTABLE CHEST 1 VIEW COMPARISON:  11/23/2018 FINDINGS: Diffuse interstitial opacity with Kerley lines and pleural effusions. There is alveolar opacity asymmetric to the right. Borderline heart size. IMPRESSION: New interstitial and airspace opacity with pleural effusions, a CHF pattern. Pneumonia was suspected on preceding radiograph and would be obscured. Electronically Signed   By: Monte Fantasia M.D.   On: 11/27/2018 04:21     CBC Recent Labs  Lab 11/24/18 0243 11/25/18 0304 11/26/18 0432 11/27/18 1055  WBC 15.7* 11.7* 13.3* 11.8*  HGB 8.5* 7.5* 7.7* 7.0*  HCT 27.4* 24.1* 24.2* 22.7*  PLT 175 178 263 254  MCV 76.8* 76.5* 76.6* 76.9*  MCH 23.8* 23.8* 24.4* 23.7*  MCHC 31.0 31.1 31.8 30.8  RDW 14.7 14.8 14.7 15.1  LYMPHSABS 0.9 1.2  --   --   MONOABS 1.6* 1.2*  --   --   EOSABS 0.1 0.2  --   --   BASOSABS 0.0 0.0  --   --     Chemistries  Recent Labs  Lab 11/24/18 0243 11/25/18 0304 11/26/18 0432 11/27/18 1055  NA 132* 134* 135 134*  K 4.0 3.6 3.2* 4.5  CL 98 103 105 106  CO2 23 21* 21* 20*  GLUCOSE 266* 128* 69* 231*  BUN 71* 71* 70* 65*  CREATININE 3.35* 3.26* 3.20* 3.14*  CALCIUM 8.2* 8.0* 7.8* 8.0*  AST 26  --   --   --   ALT 21  --   --   --   ALKPHOS 87  --   --   --   BILITOT 0.5  --   --   --    ------------------------------------------------------------------------------------------------------------------ No results for input(s): CHOL, HDL, LDLCALC, TRIG, CHOLHDL, LDLDIRECT in the last  72 hours.  No results found for: HGBA1C ------------------------------------------------------------------------------------------------------------------ No results for input(s): TSH, T4TOTAL, T3FREE, THYROIDAB in the last 72 hours.  Invalid input(s): FREET3 ------------------------------------------------------------------------------------------------------------------ No results for input(s): VITAMINB12, FOLATE, FERRITIN, TIBC, IRON, RETICCTPCT in the last 72  hours.  Coagulation profile No results for input(s): INR, PROTIME in the last 168 hours.  No results for input(s): DDIMER in the last 72 hours.  Cardiac Enzymes Recent Labs  Lab 11/24/18 0243  TROPONINI 0.04*   ------------------------------------------------------------------------------------------------------------------    Component Value Date/Time   BNP 427.5 (H) 11/27/2018 1055     Roxan Hockey M.D on 11/27/2018 at 2:50 PM  Go to [email or web address] - for contact info  [facility] - Office  [phone]

## 2018-11-27 NOTE — Care Management Important Message (Signed)
Important Message  Patient Details  Name: Leah Walsh MRN: [id number] Date of Birth: [birth year]   Medicare Important Message Given:  Yes    Dina Warbington Montine Circle 11/27/2018, 3:49 PM

## 2018-11-27 NOTE — Progress Notes (Signed)
PT Cancellation Note  Patient Details Name: Leah Walsh MRN: [id number] DOB: [birth year]   Cancelled Treatment:    Reason Eval/Treat Not Completed: (P) Medical issues which prohibited therapy(Pt currently requiring 10L of supplemental O2 after bout of hypoxia.  Rn deferred PT tx at this time.  )   Cristela Blue 11/27/2018, 10:21 AM  Governor Rooks, PTA Acute Rehabilitation Services Pager [phone] Office [phone]

## 2018-11-27 NOTE — Significant Event (Signed)
Rapid Response Event Note  Overview: Respiratory - Follow Up  Initial Focused Assessment: Went to follow up on the patient while rounding. Upon arrival, patient was visibly in moderate distress, appears SOB, + increased WOB, + use of accessory muscles. She stated that her shortness of breath has worsened. Now patient is 10L oxygen via HFNC, with saturations of 88-92%. Clear lungs very diminished, R worse than L, + non productive cough, she was "winded" just in leaning forward in the bed. Other VS are stable, skin warm and dry, trace edema BLE.   Interventions: -- NO RRT INTERVENTIONS  Plan of Care: -- TRH MD came to the unit for rounds, updated on patient's condition. We reviewed the CXR from 420 this morning - New interstitial and airspace opacity with pleural effusions, a CHF pattern, being treated for PNA.  -- PLAN - Lasix 40 mg IV x 1, Encourage IS and Pulmonary Toileting, Strict I/O. I called for an update around 1325, per nurse, patient is still on 10L oxygen but SOB and WOB have improved. Voided 150, 265 remaining in bladder. RN to update MD about I/O and overall patient condition -- Of note, I did that an ABG was at 1020 - PaO2 - 59   Event Summary:  Start Time - 1010 End Time - 1040  Leah Walsh R

## 2018-11-27 NOTE — Progress Notes (Signed)
Pt's nose is bleeding a small amount. Pt's humification in high flow nasal cannula had ran out. Filled humification bottle with sterile water. Pt states that the water is too much and running down her throat. Pt requesting no humification. Notified respiratory therapist, stated ok to give pt some surgical lube to put in nostrils to lubricate them and keep nose from bleeding. Will continue to monitor pt. Leah Walsh

## 2018-11-27 NOTE — Plan of Care (Signed)
  Problem: Education: Goal: Knowledge of General Education information will improve Description: Including pain rating scale, medication(s)/side effects and non-pharmacologic comfort measures Outcome: Progressing   Problem: Health Behavior/Discharge Planning: Goal: Ability to manage health-related needs will improve Outcome: Progressing   Problem: Clinical Measurements: Goal: Ability to maintain clinical measurements within normal limits will improve Outcome: Progressing Goal: Will remain free from infection Outcome: Progressing   Problem: Nutrition: Goal: Adequate nutrition will be maintained Outcome: Progressing   Problem: Coping: Goal: Level of anxiety will decrease Outcome: Progressing   Problem: Pain Managment: Goal: General experience of comfort will improve Outcome: Progressing   

## 2018-11-28 ENCOUNTER — Inpatient Hospital Stay (HOSPITAL_COMMUNITY): Payer: PPO

## 2018-11-28 DIAGNOSIS — J9691 Respiratory failure, unspecified with hypoxia: Secondary | ICD-10-CM

## 2018-11-28 DIAGNOSIS — J9601 Acute respiratory failure with hypoxia: Secondary | ICD-10-CM

## 2018-11-28 HISTORY — DX: Respiratory failure, unspecified with hypoxia: J96.91

## 2018-11-28 LAB — PREPARE RBC (CROSSMATCH)

## 2018-11-28 LAB — COMPREHENSIVE METABOLIC PANEL
ALT: 23 U/L (ref 0–44)
AST: 20 U/L (ref 15–41)
Albumin: 2.3 g/dL — ABNORMAL LOW (ref 3.5–5.0)
Alkaline Phosphatase: 68 U/L (ref 38–126)
Anion gap: 9 (ref 5–15)
BUN: 65 mg/dL — ABNORMAL HIGH (ref 8–23)
CO2: 20 mmol/L — ABNORMAL LOW (ref 22–32)
Calcium: 8.2 mg/dL — ABNORMAL LOW (ref 8.9–10.3)
Chloride: 107 mmol/L (ref 98–111)
Creatinine, Ser: 3.27 mg/dL — ABNORMAL HIGH (ref 0.44–1.00)
GFR calc Af Amer: 15 mL/min — ABNORMAL LOW (ref >60)
GFR calc non Af Amer: 13 mL/min — ABNORMAL LOW (ref >60)
Glucose, Bld: 156 mg/dL — ABNORMAL HIGH (ref 70–99)
Potassium: 4.7 mmol/L (ref 3.5–5.1)
Sodium: 136 mmol/L (ref 135–145)
Total Bilirubin: 0.3 mg/dL (ref 0.3–1.2)
Total Protein: 5.5 g/dL — ABNORMAL LOW (ref 6.5–8.1)

## 2018-11-28 LAB — CBC
HCT: 22 % — ABNORMAL LOW (ref 36.0–46.0)
Hemoglobin: 6.9 g/dL — CL (ref 12.0–15.0)
MCH: 24.6 pg — ABNORMAL LOW (ref 26.0–34.0)
MCHC: 31.4 g/dL (ref 30.0–36.0)
MCV: 78.6 fL — ABNORMAL LOW (ref 80.0–100.0)
Platelets: 279 10*3/uL (ref 150–400)
RBC: 2.8 MIL/uL — ABNORMAL LOW (ref 3.87–5.11)
RDW: 15.2 % (ref 11.5–15.5)
WBC: 13.2 10*3/uL — ABNORMAL HIGH (ref 4.0–10.5)
nRBC: 0 % (ref 0.0–0.2)

## 2018-11-28 LAB — GLUCOSE, CAPILLARY
Glucose-Capillary: 142 mg/dL — ABNORMAL HIGH (ref 70–99)
Glucose-Capillary: 176 mg/dL — ABNORMAL HIGH (ref 70–99)
Glucose-Capillary: 133 mg/dL — ABNORMAL HIGH (ref 70–99)
Glucose-Capillary: 162 mg/dL — ABNORMAL HIGH (ref 70–99)

## 2018-11-28 LAB — ABO/RH: ABO/RH(D): A POS

## 2018-11-28 MED ORDER — SALINE SPRAY 0.65 % NA SOLN
2.0000 | Freq: Four times a day (QID) | NASAL | Status: DC
  Administered 2018-11-28 – 2018-12-03 (×21): 2 via NASAL
  Filled 2018-11-28: qty 44

## 2018-11-28 MED ORDER — ISOSORBIDE MONONITRATE ER 60 MG PO TB24
60.0000 mg | ORAL_TABLET | Freq: Every day | ORAL | Status: DC
  Administered 2018-11-29: 60 mg via ORAL
  Filled 2018-11-28: qty 1

## 2018-11-28 MED ORDER — SODIUM CHLORIDE 0.9% IV SOLUTION
Freq: Once | INTRAVENOUS | Status: AC
  Administered 2018-11-28: 08:00:00 via INTRAVENOUS

## 2018-11-28 MED ORDER — FUROSEMIDE 10 MG/ML IJ SOLN
80.0000 mg | Freq: Once | INTRAMUSCULAR | Status: AC
  Administered 2018-11-28: 11:00:00 80 mg via INTRAVENOUS
  Filled 2018-11-28: qty 8

## 2018-11-28 MED ORDER — FUROSEMIDE 10 MG/ML IJ SOLN: 40.0000 mg | Freq: Once | INTRAMUSCULAR | Status: DC

## 2018-11-28 MED ORDER — FUROSEMIDE 10 MG/ML IJ SOLN
80.0000 mg | Freq: Once | INTRAMUSCULAR | Status: AC
  Administered 2018-11-28: 80 mg via INTRAVENOUS
  Filled 2018-11-28: qty 8

## 2018-11-28 NOTE — Progress Notes (Signed)
CRITICAL VALUE ALERT  Critical Value: Hbg 6.9  Date & Time Notied:  11/28/18 0441  Provider Notified: Martie Lee, NP  Orders Received/Actions taken: Transfuse 1 unit PRBCs.

## 2018-11-28 NOTE — Progress Notes (Signed)
Patient Demographics:    Leah Walsh, is a 78 y.o. female, DOB - June 18, 1941, ZOX:[id number]  Admit date - 11/24/2018   Admitting Physician [name], MD  Outpatient Primary MD for the patient is [name], MD  LOS - 4   No chief complaint on file.       Subjective:    Leah Walsh today has no fevers, no emesis,  No chest pain, respiratory symptoms including dyspnea and cough persist... Continues to require high flow oxygen  Assessment  & Plan :    Principal Problem:   CAP (community acquired pneumonia) Active Problems:   Acute Respiratory failure with hypoxia (HCC)   Obstructive sleep apnea   Microcytic anemia   Asthma, mild persistent   Hypertension   Coronary artery disease involving native coronary artery of native heart without angina pectoris   Insulin-requiring or dependent type II diabetes mellitus (HCC)   Acute renal failure superimposed on stage 4 chronic kidney disease (HCC)   Hypertensive urgency   Hyponatremia   Nausea vomiting and diarrhea   Elevated troponin   Brief Narrative:  78 year old married female, PMH of asthma, stage IV chronic kidney disease (follows with nephrology in [facility], baseline creatinine reportedly 1.8), HTN, HLD, IDDM, OSA presented to [facility] ED due to fever, nausea, vomiting, diarrhea and dyspnea.  She developed the symptoms a week PTA, evaluated by PCP 11/18/2018, influenza PCR reportedly negative but sent for Covid-19 testing.  Due to persisting symptoms, she presented to the ED.   admission chest x-ray was consistent with right lower lobe pneumonia, she was transferred to [facility] for further evaluation. Covid-19 ruled out already Pt has OSA and PTA was using CPAP nightly which she has not been using here   CXR from 11/28/18 Persistent pleural effusions, bilateral interstitial lung opacities and right-sided airspace opacities.  Plan:-  1.CAP,/RLL PNA--- Covid-19 ruled outalready Fevers have resolved, however dyspnea and hypoxia persist -She was tested for COVID-19 by her PCP on 3/25 and results are Negative faxed in from PCPs so we  Discontinued all COVID related isolations. PTA patient was not on home O2--- hypoxia requiring high flow oxygen persist, unable to do echo given COVID with related limitations---  patient now appears volume overloaded/CHF pattern, oxygen requirement is worsened, patient now on 10 L of oxygen with increased work of breathing, ----clinical exam and chest x-ray from 11/27/2018 and 11/28/2018 consistent with CHF, Give IV Lasix as ordered, fluid input and output monitoring, daily weights, may need BiPAP (at baseline patient has OSA and needs CPAP anyway)...   blood cultures x2: Negative to date. ... procalcitonin down from 14 to 2.1.  Urine pneumococcal antigen negative. -Treated with IV ceftriaxone and azithromycin from 11/23/18 (started at [facility]) thru 11/27/18, currently on Augmentin due to enterococcus in the urine and also to cover respiratory bugs -Will need follow-up chest x-ray in 4 weeks to ensure resolution of pneumonia findings.  Patient was using CPAP---at Home every Bedtime due to OSA (Obstructive Sleep Apnea)...Marland KitchenUse BiPAP nightly and PRN during the day  2.Acute kidney injury superimposed on stage IV CKD -SCr is 3.30 on admission, up from 1.81 in December -Likely prerenal azotemia in setting of recent N/V/D; fortunately, vomiting and diarrhea appears to have resolved  renally-dose medications, avoid nephrotoxins,    Follows outpatient with nephrology in [facility], monitor renal function closely with IV diuresis.  Creatinine currently 3.27 anticipate increasing creatinine with aggressive diuresis for CHF  3)Volume Overload/acute CHF CHF----EF not available, unknown at this time if diastolic Vs Systolic , hypoxia persist, continues to require high flow oxygen , unable to do echo given COVID with  related limitations---  patient now appears volume overloaded/CHF pattern,clinical exam and chest x-ray from 11/27/2018 and 11/28/2018 consistent with CHF, ----IV Lasix as ordered, fluid input and output monitoring, daily weights,  ... BNP is 427.5, may need BiPAP (at baseline patient has OSA and needs CPAP anyway)... BNP is 427.5, F,. Diuresis may be challenging given poor renal function, escalate IV Lasix to 80 mg  Patient was using CPAP---at Home every Bedtime due to OSA (Obstructive Sleep Apnea)...Marland KitchenUse BiPAP nightly and PRN during the day  Weight was 79.1 kg on 11/26/2018, weight is up to 81.5 kg at this time--IV Lasix as ordered Fluid balance  Is Neg -560 mL in 24 hours, probably not accurate  4)DM2--episodes of hypoglycemia, oral intake is not great,  c/n Lantus insulin at reduced dose of 13 units twice daily from 15 units  5.Elevated troponin -Troponin was 0.08 (nl <0.04) in the [facility] ED..... Troponin elevation not consistent with ACS pattern, unable to do echocardiogram to rule out regional wall motion abnormalities or evaluate EF  Due to COVID related situation -There are no anginal complaints on admission and she denies any recent chest pain -Likely related to acute on chronic kidney disease and acute illness.  6)HTN--- BP is not at goal, increase isosorbide to 60 mg daily, continue Coreg at increased dose of  25 mg p.o. twice daily, continue hydralazine 100 mg 3 times daily, continue Procardia XL 90 mg daily,, use IV labetalol as needed elevated BP continue to hold losartan/HCTZ due to kidney concerns  7)  OP blood culture positive for gram-positive rod and anaerobic bottle (?  Contaminant): Blood culture sent from [facility] on 11/24/18, negative to date.  -- patient was treated with IV Rocephin and IV azithromycin starting 11/23/2018 at [facility], Rocephin and azithromycin was continued from 3/31/202o to  11/27/2018 here at [facility], plan to switch to p.o. Augmentin on 11/27/2018 to  allow for coverage of enterococcus in the urine as well  8)Acute on chronic Anemia, at baseline patient has chronic anemia of CKD----baseline hemoglobin hemoglobin 9.6 in December 2019, now hemoglobin is dropped from 8.5 on admission to 6.9,   No overt bleeding.  Some of this could be dilutional and acute illness.  Risk, benefits and alternatives to transfusion of blood products discussed. Indication for transfusion discussed. Consent obtained , transfuse 1 unit of packed cells with Lasix as ordered  .[name]... Patient may benefit from EPO/Procrit given anemia of CKD  9) Type II DM with renal complications: Reasonable inpatient control.  Continue current dose of Lantus and SSI.  10) Urine culture with enterococcus- treat empirically with Augmentin (started 11/27/18) as ordered  11)Acute Hypoxic Respiratory failure--- initially secondary to PNA, patient now appears volume overloaded/CHF pattern, --- please see #1 and #3 above may need BiPAP .[name]...  Patient was using CPAP---at Home every Bedtime due to OSA (Obstructive Sleep Apnea)...Marland KitchenUse BiPAP nightly and PRN during the day  12)Generalized weakness/debility--- patient having difficulties with mobility related activities of daily living due to hypoxia... Get PT eval when respiratory status improves  13)Depression--- stable, continue Zoloft at 25 mg daily  14)OSA---- Patient  was using CPAP---at Home every Bedtime due to OSA (Obstructive Sleep Apnea)...Marland KitchenUse BiPAP nightly and PRN during the day   NB!! Weight was 79.1 kg on 11/26/2018, weight is up to 81.5 kg at this time--IV Lasix as ordered Fluid balance  Is Neg -560 mL in 24 hours, probably not accurate   Disposition/Need for in-Hospital Stay- patient unable to be discharged at this time due to worsening respiratory failure with worsening hypoxia,   persistent and worsening hypoxia, respiratory symptoms, unable to do PT eval for disposition due to worsening respiratory status  Code Status : Full    Family Communication:   Husband Mr Luepke updated by day  Disposition Plan  : Hold off on PT eval pending improvement in respiratory status  Consults  :  na  DVT Prophylaxis  :- Heparin -  Lab Results  Component Value Date   PLT 279 11/28/2018    Inpatient Medications  Scheduled Meds: . amoxicillin-clavulanate  1 tablet Oral Q12H  . carvedilol  25 mg Oral BID WC  . furosemide  80 mg Intravenous Once  . heparin  5,000 Units Subcutaneous Q8H  . hydrALAZINE  100 mg Oral TID  . insulin aspart  0-5 Units Subcutaneous QHS  . insulin aspart  0-9 Units Subcutaneous TID WC  . insulin glargine  13 Units Subcutaneous BID  . [START ON 11/29/2018] isosorbide mononitrate  60 mg Oral Daily  . NIFEdipine  90 mg Oral Daily  . pantoprazole  40 mg Oral Daily  . sertraline  25 mg Oral Daily  . sodium chloride  2 spray Each Nare QID  . sodium chloride flush  3 mL Intravenous Q12H   Continuous Infusions:  PRN Meds:.acetaminophen **OR** acetaminophen, albuterol, HYDROcodone-acetaminophen, ipratropium-albuterol, labetalol, Muscle Rub, ondansetron **OR** ondansetron (ZOFRAN) IV    Anti-infectives (From admission, onward)   Start     Dose/Rate Route Frequency Ordered Stop   11/28/18 1000  amoxicillin-clavulanate (AUGMENTIN) 500-125 MG per tablet 500 mg  Status:  Discontinued     1 tablet Oral Every 12 hours 11/27/18 1428 11/27/18 1450   11/28/18 1000  azithromycin (ZITHROMAX) tablet 500 mg     500 mg Oral  Once 11/27/18 1428 11/28/18 0904   11/27/18 1500  amoxicillin-clavulanate (AUGMENTIN) 500-125 MG per tablet 500 mg     1 tablet Oral Every 12 hours 11/27/18 1450 11/30/18 0959   11/25/18 1000  hydroxychloroquine (PLAQUENIL) tablet 200 mg  Status:  Discontinued     200 mg Oral 2 times daily 11/24/18 1314 11/24/18 1612   11/24/18 1315  hydroxychloroquine (PLAQUENIL) tablet 400 mg  Status:  Discontinued     400 mg Oral 2 times daily 11/24/18 1314 11/24/18 1612   11/24/18 1000  cefTRIAXone  (ROCEPHIN) 1 g in sodium chloride 0.9 % 100 mL IVPB  Status:  Discontinued     1 g 200 mL/hr over 30 Minutes Intravenous Daily 11/24/18 0200 11/27/18 1426   11/24/18 1000  azithromycin (ZITHROMAX) 500 mg in sodium chloride 0.9 % 250 mL IVPB  Status:  Discontinued     500 mg 250 mL/hr over 60 Minutes Intravenous Every 24 hours 11/24/18 0200 11/27/18 1426        Objective:   Vitals:   11/28/18 0507 11/28/18 0825 11/28/18 0840 11/28/18 1125  BP:  (!) 175/55 (!) 185/56 (!) 154/59  Pulse:  90 89 77  Resp:  18 18 18   Temp:  97.8 F (36.6 C) 98.1 F (36.7 C)   TempSrc:  Oral Oral Oral  SpO2:  92% 90% (!) 89%  Weight: 81.5 kg     Height:        Wt Readings from Last 3 Encounters:  11/28/18 81.5 kg  04/29/18 79.9 kg  04/29/17 79.3 kg     Intake/Output Summary (Last 24 hours) at 11/28/2018 1134 Last data filed at 11/28/2018 1115 Gross per 24 hour  Intake 930 ml  Output 1200 ml  Net -270 ml   Physical Exam Patient is examined daily including today on 11/28/18 , exams remain the same as of yesterday except that has changed   Gen:- Awake Alert, patient intermittently with increased work of breathing HEENT:- [place].AT, No sclera icterus Nose- [employer] 10L/min Neck-Supple Neck, Lungs-no significant wheezing, diminished air movement right more than left, CV- S1, S2 normal, regular  Abd-  +ve B.Sounds, Abd Soft, No tenderness,    Extremity/Skin:- trace edema, pedal pulses present  Psych-affect is appropriate, oriented x3 Neuro-no new focal deficits, no tremors   Data Review:   Micro Results Recent Results (from the past 240 hour(s))  Culture, blood (routine x 2)     Status: None (Preliminary result)   Collection Time: 11/24/18  6:23 PM  Result Value Ref Range Status   Specimen Description BLOOD LEFT ANTECUBITAL  Final   Special Requests   Final    BOTTLES DRAWN AEROBIC AND ANAEROBIC Blood Culture adequate volume   Culture   Final    NO GROWTH 3 DAYS Performed at [place], [place] [address], [place], [place] [address]    Report Status PENDING  Incomplete  Culture, blood (routine x 2)     Status: None (Preliminary result)   Collection Time: 11/24/18  6:49 PM  Result Value Ref Range Status   Specimen Description BLOOD LEFT HAND  Final   Special Requests   Final    BOTTLES DRAWN AEROBIC AND ANAEROBIC Blood Culture results may not be optimal due to an excessive volume of blood received in culture bottles   Culture   Final    NO GROWTH 3 DAYS Performed at [facility], [place] [address] [place], [place] [address]    Report Status PENDING  Incomplete  Culture, Urine     Status: Abnormal   Collection Time: 11/24/18 11:28 PM  Result Value Ref Range Status   Specimen Description URINE, RANDOM  Final   Special Requests   Final    NONE Performed at [facility], [place] [address], [place], [place] [address]    Culture 20,000 COLONIES/mL ENTEROCOCCUS FAECALIS (A)  Final   Report Status 11/27/2018 FINAL  Final   Organism ID, Bacteria ENTEROCOCCUS FAECALIS (A)  Final      Susceptibility   Enterococcus faecalis - MIC*    AMPICILLIN <=2 SENSITIVE Sensitive     LEVOFLOXACIN 1 SENSITIVE Sensitive     NITROFURANTOIN <=16 SENSITIVE Sensitive     VANCOMYCIN 1 SENSITIVE Sensitive     * 20,000 COLONIES/mL ENTEROCOCCUS FAECALIS    Radiology Reports Dg Chest 2 View  Result Date: 11/28/2018 CLINICAL DATA:  Short of breath. Follow-up exam. EXAM: CHEST - 2 VIEW COMPARISON:  11/27/2018 and 11/23/2018 FINDINGS: Allowing for differences in technique and patient positioning, there has been no significant interval change from the most recent prior exam. Right larger than left pleural effusions, bilateral interstitial lung opacities and right-sided, centrally predominant and lower lung zone airspace opacities are stable. No new lung abnormalities. No pneumothorax. IMPRESSION: 1. No significant interval change from the most  recent prior study. 2. Persistent pleural effusions,  bilateral interstitial lung opacities and right-sided airspace opacities. Electronically Signed   By: Lajean Manes M.D.   On: 11/28/2018 07:27   Dg Chest Port 1 View  Result Date: 11/27/2018 CLINICAL DATA:  Increasing oxygen demand EXAM: PORTABLE CHEST 1 VIEW COMPARISON:  11/23/2018 FINDINGS: Diffuse interstitial opacity with Kerley lines and pleural effusions. There is alveolar opacity asymmetric to the right. Borderline heart size. IMPRESSION: New interstitial and airspace opacity with pleural effusions, a CHF pattern. Pneumonia was suspected on preceding radiograph and would be obscured. Electronically Signed   By: Monte Fantasia M.D.   On: 11/27/2018 04:21     CBC Recent Labs  Lab 11/24/18 0243 11/25/18 0304 11/26/18 0432 11/27/18 1055 11/28/18 0301  WBC 15.7* 11.7* 13.3* 11.8* 13.2*  HGB 8.5* 7.5* 7.7* 7.0* 6.9*  HCT 27.4* 24.1* 24.2* 22.7* 22.0*  PLT 175 178 263 254 279  MCV 76.8* 76.5* 76.6* 76.9* 78.6*  MCH 23.8* 23.8* 24.4* 23.7* 24.6*  MCHC 31.0 31.1 31.8 30.8 31.4  RDW 14.7 14.8 14.7 15.1 15.2  LYMPHSABS 0.9 1.2  --   --   --   MONOABS 1.6* 1.2*  --   --   --   EOSABS 0.1 0.2  --   --   --   BASOSABS 0.0 0.0  --   --   --     Chemistries  Recent Labs  Lab 11/24/18 0243 11/25/18 0304 11/26/18 0432 11/27/18 1055 11/28/18 0301  NA 132* 134* 135 134* 136  K 4.0 3.6 3.2* 4.5 4.7  CL 98 103 105 106 107  CO2 23 21* 21* 20* 20*  GLUCOSE 266* 128* 69* 231* 156*  BUN 71* 71* 70* 65* 65*  CREATININE 3.35* 3.26* 3.20* 3.14* 3.27*  CALCIUM 8.2* 8.0* 7.8* 8.0* 8.2*  AST 26  --   --   --  20  ALT 21  --   --   --  23  ALKPHOS 87  --   --   --  68  BILITOT 0.5  --   --   --  0.3   ------------------------------------------------------------------------------------------------------------------ No results for input(s): CHOL, HDL, LDLCALC, TRIG, CHOLHDL, LDLDIRECT in the last 72 hours.  No results found for: HGBA1C  ------------------------------------------------------------------------------------------------------------------ No results for input(s): TSH, T4TOTAL, T3FREE, THYROIDAB in the last 72 hours.  Invalid input(s): FREET3 ------------------------------------------------------------------------------------------------------------------ No results for input(s): VITAMINB12, FOLATE, FERRITIN, TIBC, IRON, RETICCTPCT in the last 72 hours.  Coagulation profile No results for input(s): INR, PROTIME in the last 168 hours.  No results for input(s): DDIMER in the last 72 hours.  Cardiac Enzymes Recent Labs  Lab 11/24/18 0243  TROPONINI 0.04*   ------------------------------------------------------------------------------------------------------------------    Component Value Date/Time   BNP 427.5 (H) 11/27/2018 1055     Roxan Hockey M.D on 11/28/2018 at 11:34 AM  Go to [email or web address] - for contact info  [facility] - Office  [phone]

## 2018-11-28 NOTE — Progress Notes (Signed)
Patient placed on BiPAP per MD order.  Patient could not tolerate settings of 18/10. Settings were titrated to 15/8 for patient comfort.  Patient is tolerating well as this time.  RT will continue to monitor.

## 2018-11-29 LAB — CBC
HCT: 27.4 % — ABNORMAL LOW (ref 36.0–46.0)
Hemoglobin: 8.9 g/dL — ABNORMAL LOW (ref 12.0–15.0)
MCH: 25.3 pg — ABNORMAL LOW (ref 26.0–34.0)
MCHC: 32.5 g/dL (ref 30.0–36.0)
MCV: 77.8 fL — ABNORMAL LOW (ref 80.0–100.0)
Platelets: 279 10*3/uL (ref 150–400)
RBC: 3.52 MIL/uL — ABNORMAL LOW (ref 3.87–5.11)
RDW: 15.3 % (ref 11.5–15.5)
WBC: 14 10*3/uL — ABNORMAL HIGH (ref 4.0–10.5)
nRBC: 0 % (ref 0.0–0.2)

## 2018-11-29 LAB — BPAM RBC
Blood Product Expiration Date: 202004172359
ISSUE DATE / TIME: 202004040815
Unit Type and Rh: 6200

## 2018-11-29 LAB — COMPREHENSIVE METABOLIC PANEL
ALT: 21 U/L (ref 0–44)
AST: 18 U/L (ref 15–41)
Albumin: 2.5 g/dL — ABNORMAL LOW (ref 3.5–5.0)
Alkaline Phosphatase: 73 U/L (ref 38–126)
Anion gap: 10 (ref 5–15)
BUN: 61 mg/dL — ABNORMAL HIGH (ref 8–23)
CO2: 22 mmol/L (ref 22–32)
Calcium: 8.7 mg/dL — ABNORMAL LOW (ref 8.9–10.3)
Chloride: 104 mmol/L (ref 98–111)
Creatinine, Ser: 3.28 mg/dL — ABNORMAL HIGH (ref 0.44–1.00)
GFR calc Af Amer: 15 mL/min — ABNORMAL LOW (ref >60)
GFR calc non Af Amer: 13 mL/min — ABNORMAL LOW (ref >60)
Glucose, Bld: 148 mg/dL — ABNORMAL HIGH (ref 70–99)
Potassium: 4 mmol/L (ref 3.5–5.1)
Sodium: 136 mmol/L (ref 135–145)
Total Bilirubin: 0.6 mg/dL (ref 0.3–1.2)
Total Protein: 5.7 g/dL — ABNORMAL LOW (ref 6.5–8.1)

## 2018-11-29 LAB — TYPE AND SCREEN
ABO/RH(D): A POS
Antibody Screen: NEGATIVE
Unit division: 0

## 2018-11-29 LAB — CULTURE, BLOOD (ROUTINE X 2)
Culture: NO GROWTH
Culture: NO GROWTH
SPECIAL REQUESTS: ADEQUATE

## 2018-11-29 LAB — GLUCOSE, CAPILLARY
Glucose-Capillary: 124 mg/dL — ABNORMAL HIGH (ref 70–99)
Glucose-Capillary: 180 mg/dL — ABNORMAL HIGH (ref 70–99)
Glucose-Capillary: 116 mg/dL — ABNORMAL HIGH (ref 70–99)
Glucose-Capillary: 138 mg/dL — ABNORMAL HIGH (ref 70–99)

## 2018-11-29 MED ORDER — CLONIDINE HCL 0.1 MG PO TABS
0.1000 mg | ORAL_TABLET | Freq: Three times a day (TID) | ORAL | Status: DC
  Administered 2018-11-29 – 2018-12-03 (×14): 0.1 mg via ORAL
  Filled 2018-11-29 (×13): qty 1

## 2018-11-29 MED ORDER — ISOSORBIDE MONONITRATE ER 60 MG PO TB24
120.0000 mg | ORAL_TABLET | Freq: Every day | ORAL | Status: DC
  Administered 2018-11-29: 60 mg via ORAL
  Administered 2018-11-30 – 2018-12-03 (×4): 120 mg via ORAL
  Filled 2018-11-29 (×5): qty 2

## 2018-11-29 MED ORDER — FUROSEMIDE 10 MG/ML IJ SOLN
40.0000 mg | Freq: Two times a day (BID) | INTRAMUSCULAR | Status: DC
  Administered 2018-11-29 – 2018-12-02 (×8): 40 mg via INTRAVENOUS
  Filled 2018-11-29 (×9): qty 4

## 2018-11-29 MED ORDER — LIP MEDEX EX OINT
1.0000 "application " | TOPICAL_OINTMENT | CUTANEOUS | Status: DC | PRN
  Filled 2018-11-29: qty 7

## 2018-11-29 NOTE — Progress Notes (Signed)
Patient alert and oriented x4, refused Bipap placement overnight she informed that it gets to "Hot".  Educated on rationale for use.  Respiratory therapist called due to destating oxygen 86% on 10L [place] and let her know that patient refused Bipap.  Therapist recommended to increase oxygen to 15%.  Patient stating 95-98% with follow up and tapered back to 10% with her baseline being 89-92%.  Patinet had greater than 1092ml UOP status post lasix at shift change on 11/28/2018.  Blood pressures remain elevated post PO scheduled medications 623'J systolic.  Provided prn labetalol 10mg   At shift change, will follow up.  Stable condition at end of shift.  Will continue to monitor.

## 2018-11-29 NOTE — Progress Notes (Signed)
Patient Demographics:    Leah Walsh, is a 78 y.o. female, DOB - Mar 10, 1941, YYT:[id number]  Admit date - 11/24/2018   Admitting Physician [name], MD  Outpatient Primary MD for the patient is [name], MD  LOS - 5   No chief complaint on file.       Subjective:    Leah Walsh today has no fevers, no emesis,  No chest pain, respiratory symptoms improved with BiPAP use during the day, but refused BIPAP overnight..... Continues to require high flow oxygen....voiding well...  Assessment  & Plan :    Principal Problem:   CAP (community acquired pneumonia) Active Problems:   Acute Respiratory failure with hypoxia (HCC)   Obstructive sleep apnea   Microcytic anemia   Asthma, mild persistent   Hypertension   Coronary artery disease involving native coronary artery of native heart without angina pectoris   Insulin-requiring or dependent type II diabetes mellitus (HCC)   Acute renal failure superimposed on stage 4 chronic kidney disease (HCC)   Hypertensive urgency   Hyponatremia   Nausea vomiting and diarrhea   Elevated troponin   Brief Narrative:  78 year old married female, PMH of asthma, stage IV chronic kidney disease (follows with nephrology in [facility], baseline creatinine reportedly 1.8), HTN, HLD, IDDM, OSA presented to [facility] ED due to fever, nausea, vomiting, diarrhea and dyspnea.  She developed the symptoms a week PTA, evaluated by PCP 11/18/2018, influenza PCR reportedly negative but sent for Covid-19 testing.  Due to persisting symptoms, she presented to the ED.   admission chest x-ray was consistent with right lower lobe pneumonia, she was transferred to [facility] for further evaluation. Covid-19 ruled out already Pt has OSA and PTA was using CPAP nightly   CXR from 11/28/18 Persistent pleural effusions, bilateral interstitial lung opacities and right-sided airspace  opacities.  Plan:- 1)CAP/RLL PNA--- Covid-19 ruled outalready Fevers have resolved,  dyspnea and hypoxia improving with diuresis and BiPAP use -She was tested for COVID-19 by her PCP on 11/18/18 and results are Negative faxed in from PCPs so we  Discontinued all COVID related isolations. PTA patient was not on home O2--- hypoxia requiring high flow oxygen persist, unable to do echo given COVID with related limitations---  patient now appears volume overloaded/CHF pattern,----clinical exam and chest x-ray from 11/27/2018 and 11/28/2018 consistent with CHF--- dyspnea and hypoxia improving with diuresis and BiPAP use   fluid input and output monitoring, daily weights, continue BiPAP as needed during the day and nightly (at baseline patient has OSA and needs CPAP anyway)...   blood cultures x2: Negative to date. ... procalcitonin down from 14 to 2.1.  Urine pneumococcal antigen negative. -Treated with IV ceftriaxone and azithromycin from 11/23/18 (started at [facility]) thru 11/27/18, currently on Augmentin due to enterococcus in the urine and also to cover respiratory bugs -Will need follow-up chest x-ray in 4 weeks to ensure resolution of pneumonia findings.   2.Acute kidney injury Superimposed on stage IV CKD -SCr is 3.30 on admission, up from 1.81 in December -Likely prerenal azotemia in setting of recent N/V/D; fortunately, vomiting and diarrhea appears to have resolved  renally-dose medications, avoid nephrotoxins,    Follows outpatient with nephrology in [facility], monitor renal function closely with  IV diuresis.  Creatinine currently 3.28 anticipate increasing creatinine with aggressive diuresis for CHF  3)Volume Overload/Acute CHF CHF----EF not available, unknown at this time if diastolic Vs Systolic , hypoxia persist, continues to require high flow oxygen , unable to do echo given COVID with related limitations---  patient now appears volume overloaded/CHF pattern,clinical exam and chest x-ray from  11/27/2018 and 11/28/2018 consistent with CHF, ---dyspnea and hypoxia improving with diuresis and BiPAP use--  fluid input and output monitoring, daily weights,  ... BNP is 427.5,- continue BiPAP as needed during the day and nightly (at baseline patient has OSA and needs CPAP anyway).  Weight was 79.1 kg on 11/26/2018, weight is up to 81.5 kg on 11/28/18, wt is now down to 77.5 kg at this time--decreased IV Lasix to 40 mg twice daily from 80 mg twice daily, fluid balance  Is Neg > 3.3 Liters  mL in 24 hours,     4)DM2-- oral intake is not great,  c/n Lantus insulin at reduced dose of 13 units twice daily from 15 units  5.Elevated troponin -Troponin was 0.08 (nl <0.04) in the [facility] ED..... Troponin elevation not consistent with ACS pattern, unable to do echocardiogram to rule out regional wall motion abnormalities or evaluate EF  Due to COVID related situation -There are no anginal complaints on admission and she denies any recent chest pain -Likely related to acute on chronic kidney disease and acute illness.  6)HTN--- BP is not at goal, add clonidine 0.1 mg 3 times daily, increase  isosorbide to 120 mg daily, continue Coreg at increased dose of  25 mg p.o. twice daily, continue hydralazine 100 mg 3 times daily, continue Procardia XL 90 mg daily,, use IV labetalol as needed elevated BP continue to hold losartan/HCTZ due to kidney concerns  7) OP blood culture positive for gram-positive rod and anaerobic bottle (?  Contaminant): Blood culture sent from [facility] on 11/24/18, negative to date.  -- patient was treated with IV Rocephin and IV azithromycin starting 11/23/2018 at [facility], Rocephin and azithromycin was continued from 3/31/202o to  11/27/2018 here at [facility], plan to switch to p.o. Augmentin on 11/27/2018 to allow for coverage of enterococcus in the urine as well  8)Acute on chronic Anemia, at baseline patient has chronic anemia of CKD----baseline hemoglobin hemoglobin 9.6 in December  2019,   hemoglobin is up to 8.9 from  6.9, after transfusion of 1 unit of PRBC on 11/28/2018, No overt bleeding. ..... Patient may benefit from EPO/Procrit given anemia of CKD  9)OSA---- Patient was using CPAP---at Home every Bedtime due to OSA (Obstructive Sleep Apnea)...Marland KitchenUse BiPAP nightly and PRN during the day  10) Urine culture with Enterococcus-  C/n  Augmentin (started 11/27/18) for 5 days  11)Acute Hypoxic Respiratory failure--- initially secondary to PNA, patient now appears volume overloaded/CHF pattern, --- please see #1 and  #3 above...  Patient was using CPAP---at Home every Bedtime due to OSA (Obstructive Sleep Apnea)...Marland KitchenUse BiPAP nightly and PRN during the day  12)Generalized weakness/debility--- patient having difficulties with mobility related activities of daily living due to hypoxia... Get PT eval when respiratory status improves  13)Depression--- stable, continue Zoloft at 25 mg daily     NB!! Weight was 79.1 kg on 11/26/2018, weight is up to 81.5 kg on 11/28/18, wt is now down to 77.5 kg at this time--decreased IV Lasix to 40 mg twice daily from 80 mg twice daily, fluid balance  Is Neg > 3.3 Liters  mL in 24 hours,  Disposition/Need for in-Hospital Stay- patient unable to be discharged at this time due to worsening respiratory failure with worsening hypoxia,   persistent and worsening hypoxia, respiratory symptoms, unable to do PT eval for disposition due to worsening respiratory status  Code Status : Full   Family Communication:   Husband Mr Mottram updated by phone  Disposition Plan  : Hold off on PT eval pending improvement in respiratory status  Consults  :  na  DVT Prophylaxis  :- Heparin -  Lab Results  Component Value Date   PLT 279 11/29/2018    Inpatient Medications  Scheduled Meds: . amoxicillin-clavulanate  1 tablet Oral Q12H  . carvedilol  25 mg Oral BID WC  . cloNIDine  0.1 mg Oral TID  . furosemide  40 mg Intravenous BID  . heparin  5,000  Units Subcutaneous Q8H  . hydrALAZINE  100 mg Oral TID  . insulin aspart  0-5 Units Subcutaneous QHS  . insulin aspart  0-9 Units Subcutaneous TID WC  . insulin glargine  13 Units Subcutaneous BID  . isosorbide mononitrate  120 mg Oral Daily  . NIFEdipine  90 mg Oral Daily  . pantoprazole  40 mg Oral Daily  . sertraline  25 mg Oral Daily  . sodium chloride  2 spray Each Nare QID  . sodium chloride flush  3 mL Intravenous Q12H   Continuous Infusions:  PRN Meds:.acetaminophen **OR** acetaminophen, albuterol, HYDROcodone-acetaminophen, ipratropium-albuterol, labetalol, lip balm, Muscle Rub, ondansetron **OR** ondansetron (ZOFRAN) IV    Anti-infectives (From admission, onward)   Start     Dose/Rate Route Frequency Ordered Stop   11/28/18 1000  amoxicillin-clavulanate (AUGMENTIN) 500-125 MG per tablet 500 mg  Status:  Discontinued     1 tablet Oral Every 12 hours 11/27/18 1428 11/27/18 1450   11/28/18 1000  azithromycin (ZITHROMAX) tablet 500 mg     500 mg Oral  Once 11/27/18 1428 11/28/18 0904   11/27/18 1500  amoxicillin-clavulanate (AUGMENTIN) 500-125 MG per tablet 500 mg     1 tablet Oral Every 12 hours 11/27/18 1450 11/30/18 0959   11/25/18 1000  hydroxychloroquine (PLAQUENIL) tablet 200 mg  Status:  Discontinued     200 mg Oral 2 times daily 11/24/18 1314 11/24/18 1612   11/24/18 1315  hydroxychloroquine (PLAQUENIL) tablet 400 mg  Status:  Discontinued     400 mg Oral 2 times daily 11/24/18 1314 11/24/18 1612   11/24/18 1000  cefTRIAXone (ROCEPHIN) 1 g in sodium chloride 0.9 % 100 mL IVPB  Status:  Discontinued     1 g 200 mL/hr over 30 Minutes Intravenous Daily 11/24/18 0200 11/27/18 1426   11/24/18 1000  azithromycin (ZITHROMAX) 500 mg in sodium chloride 0.9 % 250 mL IVPB  Status:  Discontinued     500 mg 250 mL/hr over 60 Minutes Intravenous Every 24 hours 11/24/18 0200 11/27/18 1426        Objective:   Vitals:   11/29/18 0514 11/29/18 0659 11/29/18 0751 11/29/18 0850   BP: (!) 167/50 (!) 183/64  (!) 193/59  Pulse: 85 89 83   Resp: 16  18   Temp: 97.7 F (36.5 C)     TempSrc: Oral     SpO2: 90%  95%   Weight: 77.5 kg     Height:        Wt Readings from Last 3 Encounters:  11/29/18 77.5 kg  04/29/18 79.9 kg  04/29/17 79.3 kg     Intake/Output Summary (Last 24 hours) at 11/29/2018  [place] filed at 11/29/2018 1150 Gross per 24 hour  Intake 123 ml  Output 3450 ml  Net -3327 ml   Physical Exam Patient is examined daily including today on 11/29/18 , exams remain the same as of yesterday except that has changed   Gen:- Awake Alert, patient intermittently with increased work of breathing HEENT:- [place].AT, No sclera icterus Nose- [place] 8 L/min Neck-Supple Neck, Lungs-no significant wheezing, diminished air movement right more than left, CV- S1, S2 normal, regular  Abd-  +ve B.Sounds, Abd Soft, No tenderness,    Extremity/Skin:- Trace edema, pedal pulses present  Psych-affect is appropriate, oriented x3 Neuro-Generalized Weakness, No new Focal deficits, no Tremors   Data Review:   Micro Results Recent Results (from the past 240 hour(s))  Culture, blood (routine x 2)     Status: None (Preliminary result)   Collection Time: 11/24/18  6:23 PM  Result Value Ref Range Status   Specimen Description BLOOD LEFT ANTECUBITAL  Final   Special Requests   Final    BOTTLES DRAWN AEROBIC AND ANAEROBIC Blood Culture adequate volume   Culture   Final    NO GROWTH 4 DAYS Performed at [facility], [place] [address] [place], [place] [address]    Report Status PENDING  Incomplete  Culture, blood (routine x 2)     Status: None (Preliminary result)   Collection Time: 11/24/18  6:49 PM  Result Value Ref Range Status   Specimen Description BLOOD LEFT HAND  Final   Special Requests   Final    BOTTLES DRAWN AEROBIC AND ANAEROBIC Blood Culture results may not be optimal due to an excessive volume of blood received in culture bottles   Culture   Final    NO  GROWTH 4 DAYS Performed at [facility], [place] [address] [place], [place] [address]    Report Status PENDING  Incomplete  Culture, Urine     Status: Abnormal   Collection Time: 11/24/18 11:28 PM  Result Value Ref Range Status   Specimen Description URINE, RANDOM  Final   Special Requests   Final    NONE Performed at [facility], [place] [address] [place], [place] [address]    Culture 20,000 COLONIES/mL ENTEROCOCCUS FAECALIS (A)  Final   Report Status 11/27/2018 FINAL  Final   Organism ID, Bacteria ENTEROCOCCUS FAECALIS (A)  Final      Susceptibility   Enterococcus faecalis - MIC*    AMPICILLIN <=2 SENSITIVE Sensitive     LEVOFLOXACIN 1 SENSITIVE Sensitive     NITROFURANTOIN <=16 SENSITIVE Sensitive     VANCOMYCIN 1 SENSITIVE Sensitive     * 20,000 COLONIES/mL ENTEROCOCCUS FAECALIS    Radiology Reports Dg Chest 2 View  Result Date: 11/28/2018 CLINICAL DATA:  Short of breath. Follow-up exam. EXAM: CHEST - 2 VIEW COMPARISON:  11/27/2018 and 11/23/2018 FINDINGS: Allowing for differences in technique and patient positioning, there has been no significant interval change from the most recent prior exam. Right larger than left pleural effusions, bilateral interstitial lung opacities and right-sided, centrally predominant and lower lung zone airspace opacities are stable. No new lung abnormalities. No pneumothorax. IMPRESSION: 1. No significant interval change from the most recent prior study. 2. Persistent pleural effusions, bilateral interstitial lung opacities and right-sided airspace opacities. Electronically Signed   By: Lajean Manes M.D.   On: 11/28/2018 07:27   Dg Chest Port 1 View  Result Date: 11/27/2018 CLINICAL DATA:  Increasing oxygen demand EXAM: PORTABLE  CHEST 1 VIEW COMPARISON:  11/23/2018 FINDINGS: Diffuse interstitial opacity with Kerley lines and pleural effusions. There is alveolar opacity asymmetric to the right. Borderline heart size. IMPRESSION: New interstitial  and airspace opacity with pleural effusions, a CHF pattern. Pneumonia was suspected on preceding radiograph and would be obscured. Electronically Signed   By: Monte Fantasia M.D.   On: 11/27/2018 04:21     CBC Recent Labs  Lab 11/24/18 0243 11/25/18 0304 11/26/18 0432 11/27/18 1055 11/28/18 0301 11/29/18 0631  WBC 15.7* 11.7* 13.3* 11.8* 13.2* 14.0*  HGB 8.5* 7.5* 7.7* 7.0* 6.9* 8.9*  HCT 27.4* 24.1* 24.2* 22.7* 22.0* 27.4*  PLT 175 178 263 254 279 279  MCV 76.8* 76.5* 76.6* 76.9* 78.6* 77.8*  MCH 23.8* 23.8* 24.4* 23.7* 24.6* 25.3*  MCHC 31.0 31.1 31.8 30.8 31.4 32.5  RDW 14.7 14.8 14.7 15.1 15.2 15.3  LYMPHSABS 0.9 1.2  --   --   --   --   MONOABS 1.6* 1.2*  --   --   --   --   EOSABS 0.1 0.2  --   --   --   --   BASOSABS 0.0 0.0  --   --   --   --     Chemistries  Recent Labs  Lab 11/24/18 0243 11/25/18 0304 11/26/18 0432 11/27/18 1055 11/28/18 0301 11/29/18 0631  NA 132* 134* 135 134* 136 136  K 4.0 3.6 3.2* 4.5 4.7 4.0  CL 98 103 105 106 107 104  CO2 23 21* 21* 20* 20* 22  GLUCOSE 266* 128* 69* 231* 156* 148*  BUN 71* 71* 70* 65* 65* 61*  CREATININE 3.35* 3.26* 3.20* 3.14* 3.27* 3.28*  CALCIUM 8.2* 8.0* 7.8* 8.0* 8.2* 8.7*  AST 26  --   --   --  20 18  ALT 21  --   --   --  23 21  ALKPHOS 87  --   --   --  68 73  BILITOT 0.5  --   --   --  0.3 0.6   ------------------------------------------------------------------------------------------------------------------ No results for input(s): CHOL, HDL, LDLCALC, TRIG, CHOLHDL, LDLDIRECT in the last 72 hours.  No results found for: HGBA1C ------------------------------------------------------------------------------------------------------------------ No results for input(s): TSH, T4TOTAL, T3FREE, THYROIDAB in the last 72 hours.  Invalid input(s): FREET3 ------------------------------------------------------------------------------------------------------------------ No results for input(s): VITAMINB12, FOLATE,  FERRITIN, TIBC, IRON, RETICCTPCT in the last 72 hours.  Coagulation profile No results for input(s): INR, PROTIME in the last 168 hours.  No results for input(s): DDIMER in the last 72 hours.  Cardiac Enzymes Recent Labs  Lab 11/24/18 0243  TROPONINI 0.04*   ------------------------------------------------------------------------------------------------------------------    Component Value Date/Time   BNP 427.5 (H) 11/27/2018 1055     Roxan Hockey M.D on 11/29/2018 at 11:54 AM  Go to [email or web address] - for contact info  [facility] - Office  [phone]

## 2018-11-30 ENCOUNTER — Inpatient Hospital Stay (HOSPITAL_COMMUNITY): Payer: PPO

## 2018-11-30 DIAGNOSIS — G4733 Obstructive sleep apnea (adult) (pediatric): Secondary | ICD-10-CM

## 2018-11-30 LAB — GLUCOSE, CAPILLARY
Glucose-Capillary: 100 mg/dL — ABNORMAL HIGH (ref 70–99)
Glucose-Capillary: 117 mg/dL — ABNORMAL HIGH (ref 70–99)
Glucose-Capillary: 140 mg/dL — ABNORMAL HIGH (ref 70–99)
Glucose-Capillary: 150 mg/dL — ABNORMAL HIGH (ref 70–99)

## 2018-11-30 LAB — BASIC METABOLIC PANEL WITH GFR
Anion gap: 12 (ref 5–15)
BUN: 57 mg/dL — ABNORMAL HIGH (ref 8–23)
CO2: 21 mmol/L — ABNORMAL LOW (ref 22–32)
Calcium: 8.5 mg/dL — ABNORMAL LOW (ref 8.9–10.3)
Chloride: 104 mmol/L (ref 98–111)
Creatinine, Ser: 3.09 mg/dL — ABNORMAL HIGH (ref 0.44–1.00)
GFR calc Af Amer: 16 mL/min — ABNORMAL LOW
GFR calc non Af Amer: 14 mL/min — ABNORMAL LOW
Glucose, Bld: 111 mg/dL — ABNORMAL HIGH (ref 70–99)
Potassium: 4 mmol/L (ref 3.5–5.1)
Sodium: 137 mmol/L (ref 135–145)

## 2018-11-30 NOTE — Progress Notes (Signed)
Patient Demographics:    Leah Walsh, is a 78 y.o. female, DOB - 16-Jan-1941, ATF:[id number]  Admit date - 11/24/2018   Admitting Physician [name], MD  Outpatient Primary MD for the patient is [name], MD  LOS - 6   No chief complaint on file.       Subjective:    Leah Walsh today has no fevers, no emesis,  No chest pain, respiratory symptoms improved with BiPAp and diuresis, patient is only intermittently compliant with BiPAP however  Assessment  & Plan :    Principal Problem:   CAP (community acquired pneumonia) Active Problems:   Acute Respiratory failure with hypoxia (HCC)   Obstructive sleep apnea   Microcytic anemia   Asthma, mild persistent   Hypertension   Coronary artery disease involving native coronary artery of native heart without angina pectoris   Insulin-requiring or dependent type II diabetes mellitus (HCC)   Acute renal failure superimposed on stage 4 chronic kidney disease (HCC)   Hypertensive urgency   Hyponatremia   Nausea vomiting and diarrhea   Elevated troponin   Brief Narrative:  78 year old married female, PMH of asthma, stage IV chronic kidney disease (follows with nephrology in [facility], baseline creatinine reportedly 1.8), HTN, HLD, IDDM, OSA presented to [facility] ED due to fever, nausea, vomiting, diarrhea and dyspnea.  She developed the symptoms a week PTA, evaluated by PCP 11/18/2018, influenza PCR reportedly negative but sent for Covid-19 testing.  Due to persisting symptoms, she presented to the ED.   admission chest x-ray was consistent with right lower lobe pneumonia, she was transferred to [facility] for further evaluation. Covid-19 ruled out already Pt has OSA and PTA was using CPAP nightly --- initially secondary to PNA , developed volume overloaded/CHF pattern--with IV diuretics clinically and radiologically volume overload appears to have  improved   Plan:- 1)CAP/RLL PNA--- Covid-19 ruled outalready Fevers have resolved,  dyspnea and hypoxia improving with diuresis and BiPAP use -She was tested for COVID-19 by her PCP on 11/18/18 and results are Negative faxed in from PCPs so we  Discontinued all COVID related isolations. PTA patient was not on home O2--- oxygen requirement improving with treatment of pneumonia and IV diuresis unable to do echo given COVID with related limitations---  initially secondary to PNA , developed volume overloaded/CHF pattern--with IV diuretics clinically and radiologically volume overload appears to have improved ---dyspnea and hypoxia improving with diuresis and BiPAP use   fluid input and output monitoring, daily weights, continue BiPAP as needed during the day and nightly (at baseline patient has OSA and needs CPAP anyway)...   blood cultures x2: Negative to date. ... procalcitonin down from 14 to 2.1.  Urine pneumococcal antigen negative. -Treated with IV ceftriaxone and azithromycin from 11/23/18 (started at [facility]) thru 11/27/18, currently on Augmentin due to enterococcus in the urine and also to cover respiratory bugs -Will need follow-up chest x-ray in 4 weeks to ensure resolution of pneumonia findings.   2.Acute kidney injury Superimposed on stage IV CKD -SCr is 3.30 on admission, up from 1.81 in December -Likely prerenal azotemia in setting of recent N/V/D; fortunately, vomiting and diarrhea appears to have resolved  renally-dose medications, avoid nephrotoxins,    Follows outpatient with nephrology  in [facility], monitor renal function closely with IV diuresis.  Creatinine currently 3.09   3)Volume Overload/Acute CHF CHF----EF not available, unknown at this time if diastolic Vs Systolic , oxygen requirement is improving,, unable to do echo given COVID with related limitations--- developed volume overloaded/CHF pattern--with IV diuretics clinically and radiologically volume overload appears to  have improved  ---dyspnea and hypoxia improving with diuresis and BiPAP use--  fluid input and output monitoring, daily weights,  ... BNP is 427.5,- continue BiPAP as needed during the day and nightly (at baseline patient has OSA and needs CPAP anyway).  Weight is 80.1 which is patient's baseline --continue IV Lasix to 40 mg twice daily  fluid balance  Is Neg > 1.8 L overnight for a total of almost 5.2 L in the last 48 hours    4)DM2-- oral intake is not great,  c/n Lantus insulin at reduced dose of 13 units twice daily from 15 units.....  Use Novolog/Humalog Sliding scale insulin with Accu-Cheks/Fingersticks as ordered    5.Elevated troponin -Troponin was 0.08 (nl <0.04) in the [facility] ED..... Troponin elevation not consistent with ACS pattern, unable to do echocardiogram to rule out regional wall motion abnormalities or evaluate EF  Due to COVID related situation -There are no anginal complaints on admission and she denies any recent chest pain -Likely related to acute on chronic kidney disease and acute illness.  6)HTN--- BP improving, c/n clonidine 0.1 mg 3 times daily,c/n  isosorbide to 120 mg daily, continue Coreg at increased dose of  25 mg p.o. twice daily, continue hydralazine 100 mg 3 times daily, continue Procardia XL 90 mg daily,, use IV labetalol as needed elevated BP continue to hold losartan/HCTZ due to kidney concerns  7)OP Blood culture positive for gram-positive rod and anaerobic bottle (?  Contaminant): Blood culture sent from [facility] on 11/24/18, negative to date.  -- patient was treated with IV Rocephin and IV azithromycin starting 11/23/2018 at [facility], Rocephin and azithromycin was continued from 3/31/202o to  11/27/2018 here at [facility],  switched to p.o. Augmentin on 11/27/2018 (for 5 days) to allow for coverage of enterococcus in the urine as well  8)Acute on chronic Anemia, at baseline patient has chronic anemia of CKD----baseline hemoglobin hemoglobin 9.6 in  December 2019,   hemoglobin is up to 8.9 from  6.9, after transfusion of 1 unit of PRBC on 11/28/2018, No overt bleeding. ..... Patient may benefit from EPO/Procrit given anemia of CKD..... H&H and transfuse as clinically indicated  9)OSA---- Patient was using CPAP---at Home every Bedtime due to OSA (Obstructive Sleep Apnea)...Marland KitchenUse BiPAP nightly and PRN during the day--patient is only intermittently compliant with BiPAP  10)Urine Culture with Enterococcus-okay to complete Augmentin (started 11/27/18) for 5 days  11)Acute Hypoxic Respiratory failure--- initially secondary to PNA , developed volume overloaded/CHF pattern--with IV diuretics clinically and radiologically volume overload appears to have improved --- please see #1 and  #3 above...  Patient was using CPAP---at Home every Bedtime due to OSA (Obstructive Sleep Apnea)...Marland KitchenUse BiPAP nightly and PRN during the day  12)Generalized weakness/debility--- patient having difficulties with mobility related activities of daily living due to hypoxia... Get PT eval to determine disposition, patient will most likely need SNF rehab  13)Depression--- stable, continue Zoloft at 25 mg daily   NB!! Weight is 80.1 which is patient's baseline --continue IV Lasix to 40 mg twice daily  fluid balance  Is Neg > 1.8 L overnight for a total of almost 5.2 L in the last  48 hours    Disposition/Need for in-Hospital Stay- patient unable to be discharged at this time due to worsening respiratory failure with worsening hypoxia,   persistent  hypoxia, respiratory symptoms, Get PT eval to determine disposition, patient will most likely need SNF rehab  Code Status : Full   Family Communication:   Husband Mr Sc[place]chfield updated by phone  Disposition Plan  : Hold off on PT eval pending Consults  :  na  DVT Prophylaxis  :- Heparin -  Lab Results  Component Value Date   PLT 279 11/29/2018    Inpatient Medications  Scheduled Meds:  carvedilol  25 mg Oral BID WC    cloNIDine  0.1 mg Oral TID   furosemide  40 mg Intravenous BID   heparin  5,000 Units Subcutaneous Q8H   hydrALAZINE  100 mg Oral TID   insulin aspart  0-5 Units Subcutaneous QHS   insulin aspart  0-9 Units Subcutaneous TID WC   insulin glargine  13 Units Subcutaneous BID   isosorbide mononitrate  120 mg Oral Daily   NIFEdipine  90 mg Oral Daily   pantoprazole  40 mg Oral Daily   sertraline  25 mg Oral Daily   sodium chloride  2 spray Each Nare QID   sodium chloride flush  3 mL Intravenous Q12H   Continuous Infusions:  PRN Meds:.acetaminophen **OR** acetaminophen, albuterol, HYDROcodone-acetaminophen, ipratropium-albuterol, labetalol, lip balm, Muscle Rub, ondansetron **OR** ondansetron (ZOFRAN) IV    Anti-infectives (From admission, onward)   Start     Dose/Rate Route Frequency Ordered Stop   11/28/18 1000  amoxicillin-clavulanate (AUGMENTIN) 500-125 MG per tablet 500 mg  Status:  Discontinued     1 tablet Oral Every 12 hours 11/27/18 1428 11/27/18 1450   11/28/18 1000  azithromycin (ZITHROMAX) tablet 500 mg     500 mg Oral  Once 11/27/18 1428 11/28/18 0904   11/27/18 1500  amoxicillin-clavulanate (AUGMENTIN) 500-125 MG per tablet 500 mg     1 tablet Oral Every 12 hours 11/27/18 1450 11/29/18 2203   11/25/18 1000  hydroxychloroquine (PLAQUENIL) tablet 200 mg  Status:  Discontinued     200 mg Oral 2 times daily 11/24/18 1314 11/24/18 1612   11/24/18 1315  hydroxychloroquine (PLAQUENIL) tablet 400 mg  Status:  Discontinued     400 mg Oral 2 times daily 11/24/18 1314 11/24/18 1612   11/24/18 1000  cefTRIAXone (ROCEPHIN) 1 g in sodium chloride 0.9 % 100 mL IVPB  Status:  Discontinued     1 g 200 mL/hr over 30 Minutes Intravenous Daily 11/24/18 0200 11/27/18 1426   11/24/18 1000  azithromycin (ZITHROMAX) 500 mg in sodium chloride 0.9 % 250 mL IVPB  Status:  Discontinued     500 mg 250 mL/hr over 60 Minutes Intravenous Every 24 hours 11/24/18 0200 11/27/18 1426         Objective:   Vitals:   11/30/18 0435 11/30/18 0500 11/30/18 0629 11/30/18 1204  BP:   (!) 152/54 (!) 150/56  Pulse:   73 68  Resp:    19  Temp:   97.9 F (36.6 C) 98.9 F (37.2 C)  TempSrc:    Oral  SpO2: 94%  96% 97%  Weight:  80.1 kg    Height:        Wt Readings from Last 3 Encounters:  11/30/18 80.1 kg  04/29/18 79.9 kg  04/29/17 79.3 kg     Intake/Output Summary (Last 24 hours) at 11/30/2018 1541 Last data filed at 11/30/2018  1519 Gross per 24 hour  Intake 123 ml  Output 1950 ml  Net -1827 ml   Physical Exam Patient is examined daily including today on 11/30/18 , exams remain the same as of yesterday except that has changed   Gen:- Awake Alert, able to speak in sentences  HEENT:- [place].AT, No sclera icterus Nose- [place] 6 L/min Neck-Supple Neck, Lungs-no significant wheezing, diminished air movement right more than left, CV- S1, S2 normal, regular  Abd-  +ve B.Sounds, Abd Soft, No tenderness,    Extremity/Skin:- Trace edema, pedal pulses present  Psych-affect is appropriate, oriented x3 Neuro-Generalized Weakness, No new Focal deficits, no Tremors   Data Review:   Micro Results Recent Results (from the past 240 hour(s))  Culture, blood (routine x 2)     Status: None   Collection Time: 11/24/18  6:23 PM  Result Value Ref Range Status   Specimen Description BLOOD LEFT ANTECUBITAL  Final   Special Requests   Final    BOTTLES DRAWN AEROBIC AND ANAEROBIC Blood Culture adequate volume   Culture   Final    NO GROWTH 5 DAYS Performed at [facility], [place] [address], [place], [place] [address]    Report Status 11/29/2018 FINAL  Final  Culture, blood (routine x 2)     Status: None   Collection Time: 11/24/18  6:49 PM  Result Value Ref Range Status   Specimen Description BLOOD LEFT HAND  Final   Special Requests   Final    BOTTLES DRAWN AEROBIC AND ANAEROBIC Blood Culture results may not be optimal due to an excessive volume of blood received in culture bottles    Culture   Final    NO GROWTH 5 DAYS Performed at [facility], [place] [address] [place], [place] [address]    Report Status 11/29/2018 FINAL  Final  Culture, Urine     Status: Abnormal   Collection Time: 11/24/18 11:28 PM  Result Value Ref Range Status   Specimen Description URINE, RANDOM  Final   Special Requests   Final    NONE Performed at [facility], [place] [address], [place], [place] [address]    Culture 20,000 COLONIES/mL ENTEROCOCCUS FAECALIS (A)  Final   Report Status 11/27/2018 FINAL  Final   Organism ID, Bacteria ENTEROCOCCUS FAECALIS (A)  Final      Susceptibility   Enterococcus faecalis - MIC*    AMPICILLIN <=2 SENSITIVE Sensitive     LEVOFLOXACIN 1 SENSITIVE Sensitive     NITROFURANTOIN <=16 SENSITIVE Sensitive     VANCOMYCIN 1 SENSITIVE Sensitive     * 20,000 COLONIES/mL ENTEROCOCCUS FAECALIS    Radiology Reports Dg Chest 2 View  Result Date: 11/30/2018 CLINICAL DATA:  Shortness of breath EXAM: CHEST - 2 VIEW COMPARISON:  11/28/2018 FINDINGS: Interstitial and alveolar airspace opacities in the right lung. Bilateral lower lobe airspace disease. Small right pleural effusion. Right basilar airspace disease. No pneumothorax. Stable cardiomediastinal silhouette. No aggressive osseous lesion. IMPRESSION: Bilateral lower lobe airspace disease which may reflect atelectasis versus pneumonia. Interstitial thickening more severe in the right lung with a small right pleural effusion. This may reflect interstitial edema versus infectious etiology. Electronically Signed   By: Kathreen Devoid   On: 11/30/2018 08:04   Dg Chest 2 View  Result Date: 11/28/2018 CLINICAL DATA:  Short of breath. Follow-up exam. EXAM: CHEST - 2 VIEW COMPARISON:  11/27/2018 and 11/23/2018 FINDINGS: Allowing for differences in technique and patient positioning, there has been no  significant interval change from the most recent prior exam. Right larger than left pleural effusions, bilateral interstitial  lung opacities and right-sided, centrally predominant and lower lung zone airspace opacities are stable. No new lung abnormalities. No pneumothorax. IMPRESSION: 1. No significant interval change from the most recent prior study. 2. Persistent pleural effusions, bilateral interstitial lung opacities and right-sided airspace opacities. Electronically Signed   By: Lajean Manes M.D.   On: 11/28/2018 07:27   Dg Chest Port 1 View  Result Date: 11/27/2018 CLINICAL DATA:  Increasing oxygen demand EXAM: PORTABLE CHEST 1 VIEW COMPARISON:  11/23/2018 FINDINGS: Diffuse interstitial opacity with Kerley lines and pleural effusions. There is alveolar opacity asymmetric to the right. Borderline heart size. IMPRESSION: New interstitial and airspace opacity with pleural effusions, a CHF pattern. Pneumonia was suspected on preceding radiograph and would be obscured. Electronically Signed   By: Monte Fantasia M.D.   On: 11/27/2018 04:21     CBC Recent Labs  Lab 11/24/18 0243 11/25/18 0304 11/26/18 0432 11/27/18 1055 11/28/18 0301 11/29/18 0631  WBC 15.7* 11.7* 13.3* 11.8* 13.2* 14.0*  HGB 8.5* 7.5* 7.7* 7.0* 6.9* 8.9*  HCT 27.4* 24.1* 24.2* 22.7* 22.0* 27.4*  PLT 175 178 263 254 279 279  MCV 76.8* 76.5* 76.6* 76.9* 78.6* 77.8*  MCH 23.8* 23.8* 24.4* 23.7* 24.6* 25.3*  MCHC 31.0 31.1 31.8 30.8 31.4 32.5  RDW 14.7 14.8 14.7 15.1 15.2 15.3  LYMPHSABS 0.9 1.2  --   --   --   --   MONOABS 1.6* 1.2*  --   --   --   --   EOSABS 0.1 0.2  --   --   --   --   BASOSABS 0.0 0.0  --   --   --   --     Chemistries  Recent Labs  Lab 11/24/18 0243  11/26/18 0432 11/27/18 1055 11/28/18 0301 11/29/18 0631 11/30/18 0421  NA 132*   < > 135 134* 136 136 137  K 4.0   < > 3.2* 4.5 4.7 4.0 4.0  CL 98   < > 105 106 107 104 104  CO2 23   < > 21* 20* 20* 22 21*  GLUCOSE 266*   < > 69* 231* 156* 148* 111*  BUN 71*   < > 70* 65* 65* 61* 57*  CREATININE 3.35*   < > 3.20* 3.14* 3.27* 3.28* 3.09*  CALCIUM 8.2*   < > 7.8*  8.0* 8.2* 8.7* 8.5*  AST 26  --   --   --  20 18  --   ALT 21  --   --   --  23 21  --   ALKPHOS 87  --   --   --  68 73  --   BILITOT 0.5  --   --   --  0.3 0.6  --    < > = values in this interval not displayed.   Coagulation profile No results for input(s): INR, PROTIME in the last 168 hours.  No results for input(s): DDIMER in the last 72 hours.  Cardiac Enzymes Recent Labs  Lab 11/24/18 0243  TROPONINI 0.04*   ------------------------------------------------------------------------------------------------------------------    Component Value Date/Time   BNP 427.5 (H) 11/27/2018 1055     Roxan Hockey M.D on 11/30/2018 at 3:41 PM  Go to [email or web address] - for contact info  [facility] - Office  [phone]

## 2018-11-30 NOTE — Progress Notes (Signed)
Patient respiratory status continues to improve noted on HFNC at start of shift at 6L saturations 95-96%.  Patient request for Bipap, she tolerated wear x one hour, then verbalized complaints of heat coming from mask.  Respiratory notified.  Her saturation noted 100% while Bipap in use.  HFNC replaced once mask removed and oxygen tapered down to 5 L,she remains 96-98%.  Blood pressures stable during shift.  Will continue to monitor.

## 2018-11-30 NOTE — Progress Notes (Signed)
Patient refused BIPAP HS. Patient states that the mask smothers her. Patient is on [place] tolerating well.

## 2018-11-30 NOTE — Progress Notes (Signed)
BIPAP removed per pt request.  Complaining that "air" is to hot despite there being no heat add to NIV.  Pt placed back on 6 lpm HFNC, Spo2 96%.  V60 remains at bedside for pt use if required.

## 2018-11-30 NOTE — Progress Notes (Signed)
Patient sitting in the recliner - Sat O2 96% on 2L [place]. When sleeping patient sat O2 dropped on 88% on 2L; on 3L [place] sat O2 came up to 93%. Will continue to monitor.

## 2018-11-30 NOTE — Progress Notes (Signed)
Physical Therapy Treatment Patient Details Name: Leah Walsh MRN: [id number] DOB: Oct 13, 1940 Today's Date: 11/30/2018    History of Present Illness 78 yo F with history of sleep apnea, kidney disease, HTN, hyperlipidemia, heart attack, DM, asthma, ovarian tumor removal, carotid stent. Fell after vomiting at home and diagnosed with pneumonia.     PT Comments    Continuing work on functional mobility and activity tolerance;  Noting very nice improvements today, especially in activity tolerance and less supplemental O2 requirements; At this point, I favor Ms. Stanczak dc'ing home; we discussed getting assistance from family for IADLs like grocery shopping, etc; Worth getting an OT evaluation for a closer look at ADLs if we a re considering dc home; will order per protocol  Follow Up Recommendations  Outpatient PT     Equipment Recommendations  None recommended by PT    Recommendations for Other Services       Precautions / Restrictions Precautions Precautions: Fall Precaution Comments: Fall risk improving    Mobility  Bed Mobility Overal bed mobility: Independent                Transfers Overall transfer level: Needs assistance   Transfers: Sit to/from Stand Sit to Stand: Min guard         General transfer comment: fatigues quickly but aware of need to sit and rest  Ambulation/Gait Ambulation/Gait assistance: Min guard Gait Distance (Feet): 55 Feet(back and forth in room) Assistive device: None Gait Pattern/deviations: Step-through pattern     General Gait Details: Cues to self-monitor for activity tolerance   Stairs             Wheelchair Mobility    Modified Rankin (Stroke Patients Only)       Balance     Sitting balance-Leahy Scale: Normal     Standing balance support: Single extremity supported Standing balance-Leahy Scale: Fair                              Cognition Arousal/Alertness: Awake/alert Behavior During  Therapy: WFL for tasks assessed/performed Overall Cognitive Status: Within Functional Limits for tasks assessed                                        Exercises      General Comments General comments (skin integrity, edema, etc.): Walked on Room Air for a large portion of the session, and her O2 sats remained 90-91%; Ended session with pt on 2 L supplemental O2, sitting in chair, sats 96%; Notified Greta, RN      Pertinent Vitals/Pain Pain Assessment: Faces Faces Pain Scale: No hurt Pain Location: discomfort on her bottom from being in the bed so long Pain Descriptors / Indicators: Discomfort Pain Intervention(s): Monitored during session;Repositioned    Home Living                      Prior Function            PT Goals (current goals can now be found in the care plan section) Acute Rehab PT Goals Patient Stated Goal: not be on oxygen PT Goal Formulation: With patient Time For Goal Achievement: 12/10/18 Potential to Achieve Goals: Good Progress towards PT goals: Progressing toward goals    Frequency    Min 2X/week      PT Plan Current plan remains  appropriate    Co-evaluation              AM-PAC PT "6 Clicks" Mobility   Outcome Measure  Help needed turning from your back to your side while in a flat bed without using bedrails?: None Help needed moving from lying on your back to sitting on the side of a flat bed without using bedrails?: None Help needed moving to and from a bed to a chair (including a wheelchair)?: A Little Help needed standing up from a chair using your arms (e.g., wheelchair or bedside chair)?: A Little Help needed to walk in hospital room?: A Little Help needed climbing 3-5 steps with a railing? : A Lot 6 Click Score: 19    End of Session Equipment Utilized During Treatment: Oxygen Activity Tolerance: Patient tolerated treatment well;Patient limited by fatigue Patient left: in bed;with call bell/phone within  reach   PT Visit Diagnosis: Unsteadiness on feet (R26.81);Other abnormalities of gait and mobility (R26.89);Muscle weakness (generalized) (M62.81);Difficulty in walking, not elsewhere classified (R26.2)     Time: 6568-1275 PT Time Calculation (min) (ACUTE ONLY): 35 min  Charges:  $Gait Training: 8-22 mins $Therapeutic Activity: 8-22 mins                     Roney Marion, PT  Acute Rehabilitation Services Pager [phone] Office [place] 11/30/2018, 4:58 PM

## 2018-12-01 LAB — BASIC METABOLIC PANEL
Anion gap: 9 (ref 5–15)
BUN: 53 mg/dL — ABNORMAL HIGH (ref 8–23)
CO2: 23 mmol/L (ref 22–32)
Calcium: 8.3 mg/dL — ABNORMAL LOW (ref 8.9–10.3)
Chloride: 104 mmol/L (ref 98–111)
Creatinine, Ser: 2.94 mg/dL — ABNORMAL HIGH (ref 0.44–1.00)
GFR calc Af Amer: 17 mL/min — ABNORMAL LOW (ref >60)
GFR calc non Af Amer: 15 mL/min — ABNORMAL LOW (ref >60)
Glucose, Bld: 120 mg/dL — ABNORMAL HIGH (ref 70–99)
Potassium: 3.7 mmol/L (ref 3.5–5.1)
Sodium: 136 mmol/L (ref 135–145)

## 2018-12-01 LAB — GLUCOSE, CAPILLARY
Glucose-Capillary: 111 mg/dL — ABNORMAL HIGH (ref 70–99)
Glucose-Capillary: 161 mg/dL — ABNORMAL HIGH (ref 70–99)
Glucose-Capillary: 112 mg/dL — ABNORMAL HIGH (ref 70–99)
Glucose-Capillary: 178 mg/dL — ABNORMAL HIGH (ref 70–99)

## 2018-12-01 LAB — CBC
HCT: 25.9 % — ABNORMAL LOW (ref 36.0–46.0)
Hemoglobin: 8.2 g/dL — ABNORMAL LOW (ref 12.0–15.0)
MCH: 24.6 pg — ABNORMAL LOW (ref 26.0–34.0)
MCHC: 31.7 g/dL (ref 30.0–36.0)
MCV: 77.5 fL — ABNORMAL LOW (ref 80.0–100.0)
Platelets: 272 10*3/uL (ref 150–400)
RBC: 3.34 MIL/uL — ABNORMAL LOW (ref 3.87–5.11)
RDW: 15.5 % (ref 11.5–15.5)
WBC: 10.7 10*3/uL — ABNORMAL HIGH (ref 4.0–10.5)
nRBC: 0 % (ref 0.0–0.2)

## 2018-12-01 NOTE — Progress Notes (Signed)
Patient Demographics:    Leah Walsh, is a 78 y.o. female, DOB - 06-02-1941, YYQ:[id number]  Admit date - 11/24/2018   Admitting Physician [name], MD  Outpatient Primary MD for the patient is [name], MD  LOS - 7   No chief complaint on file.       Subjective:    Leah Walsh today has no fevers, no emesis,  No chest pain, oxygen requirement down to 2 L/min, patient remains reluctant to use BiPAP, fluid balance remains negative  Assessment  & Plan :    Principal Problem:   CAP (community acquired pneumonia) Active Problems:   Acute Respiratory failure with hypoxia (HCC)   Obstructive sleep apnea   Microcytic anemia   Asthma, mild persistent   Hypertension   Coronary artery disease involving native coronary artery of native heart without angina pectoris   Insulin-requiring or dependent type II diabetes mellitus (HCC)   Acute renal failure superimposed on stage 4 chronic kidney disease (HCC)   Hypertensive urgency   Hyponatremia   Nausea vomiting and diarrhea   Elevated troponin   Brief Narrative:  78 year old married female, PMH of asthma, stage IV chronic kidney disease (follows with nephrology in [facility], baseline creatinine reportedly 1.8), HTN, HLD, IDDM, OSA presented to [facility] ED due to fever, nausea, vomiting, diarrhea and dyspnea.  She developed the symptoms a week PTA, evaluated by PCP 11/18/2018, influenza PCR reportedly negative but sent for Covid-19 testing.  Due to persisting symptoms, she presented to the ED.   admission chest x-ray was consistent with right lower lobe pneumonia, she was transferred to [facility] for further evaluation. Covid-19 ruled out already Pt has OSA and PTA was using CPAP nightly --- initially secondary to PNA , developed volume overloaded/CHF pattern--with IV diuretics clinically and radiologically volume overload appears to have  improved   Plan:- 1)CAP/RLL PNA--- Covid-19 ruled outalready Fevers have resolved,  dyspnea and hypoxia improving with diuresis --patient remains not very compliant with BiPAP use--- -She was tested for COVID-19 by her PCP on 11/18/18 and results are Negative faxed in from PCPs so we  Discontinued all COVID related isolations. PTA patient was not on home O2--- oxygen requirement improving with treatment of pneumonia and IV diuresis--- few days ago patient was requiring 10 L of oxygen via nasal cannula she is down to 2 L as of 12/01/2018.  unable to do echo given COVID with related limitations--- hypoxic respiratory failure was initially secondary to PNA , developed volume overloaded/CHF pattern--with IV diuretics clinically and radiologically volume overload appears to have improved ---dyspnea and hypoxia improving  fluid input and output monitoring, daily weights, continue BiPAP as needed during the day and nightly (at baseline patient has OSA and needs CPAP anyway)...   blood cultures x2: Negative to date. ... procalcitonin down from 14 to 2.1.  Urine pneumococcal antigen negative.  WBC is 10.4 -Treated with IV ceftriaxone and azithromycin from 11/23/18 (started at [facility]) thru 11/27/18, okay to complete Augmentin due to enterococcus in the urine and also to cover respiratory bugs -Will need follow-up chest x-ray in 4 weeks to ensure resolution of pneumonia findings.    2.Acute kidney injury Superimposed on stage IV CKD -SCr is 3.30 on admission, up  from 1.81 in December -Likely prerenal azotemia in setting of recent N/V/D; fortunately, vomiting and diarrhea appears to have resolved  renally-dose medications, avoid nephrotoxins,    Follows outpatient with nephrology in [facility], monitor renal function closely with IV diuresis.  Creatinine trending down, currently down to 2.9  3)Volume Overload/Acute CHF CHF----overall improving as noted above #1 , EF not available, unknown at this time if  diastolic Vs Systolic , oxygen requirement is improving (please see #1 above),, unable to do echo given COVID with related limitations--- developed volume overloaded/CHF pattern--please see #1 above, BNP is 427.5,- continue BiPAP as needed during the day and nightly (at baseline patient has OSA and needs CPAP anyway).  Chest x-ray on 12/02/2018  Weight is 80kg which is patient's baseline --continue IV Lasix to 40 mg twice daily  fluid balance  Is Neg > 1.85 L overnight for a total of > 6 L in the last 72 hours     4)DM2-- oral intake is not great,  c/n Lantus insulin at reduced dose of 13 units twice daily from 15 units.....  Use Novolog/Humalog Sliding scale insulin with Accu-Cheks/Fingersticks as ordered    5.Elevated troponin -Troponin was 0.08 (nl <0.04) in the [facility] ED..... Troponin elevation not consistent with ACS pattern, unable to do echocardiogram to rule out regional wall motion abnormalities or evaluate EF  Due to COVID related situation -There are no anginal complaints on admission and she denies any recent chest pain -Likely related to acute on chronic kidney disease and acute illness.  6)HTN--- BP improving, c/n clonidine 0.1 mg 3 times daily,c/n  isosorbide to 120 mg daily, continue Coreg at increased dose of  25 mg p.o. twice daily, continue hydralazine 100 mg 3 times daily, continue Procardia XL 90 mg daily,, use IV labetalol as needed elevated BP continue to hold losartan/HCTZ due to kidney concerns  7)OP Blood culture positive for gram-positive rod and anaerobic bottle (?  Contaminant): Blood culture sent from [facility] on 11/24/18, negative to date.  -- patient was treated with IV Rocephin and IV azithromycin starting 11/23/2018 at [facility], Rocephin and azithromycin was continued from 3/31/202o to  11/27/2018 here at [facility],  switched to p.o. Augmentin on 11/27/2018 (for 5 days) to allow for coverage of enterococcus in the urine as well  8)Acute on chronic Anemia, at  baseline patient has chronic anemia of CKD----baseline hemoglobin hemoglobin 9.6 in December 2019,   hemoglobin is up to 8.2 from  6.9, after transfusion of 1 unit of PRBC on 11/28/2018, No overt bleeding. ..... Patient may benefit from EPO/Procrit given anemia of CKD..... H&H and transfuse as clinically indicated  9)OSA---- Patient was using CPAP---at Home every Bedtime due to OSA (Obstructive Sleep Apnea)...Marland KitchenUse BiPAP nightly and PRN during the day--patient is not very compliant with BiPAP  10)Urine Culture with Enterococcus-okay to complete Augmentin (started 11/27/18) for 5 days  11)Acute Hypoxic Respiratory failure--- initially secondary to PNA , developed volume overloaded/CHF pattern--with IV diuretics clinically and radiologically volume overload appears to have improved --- please see #1 and  #3 above...  Patient was using CPAP---at Home every Bedtime due to OSA (Obstructive Sleep Apnea)...Marland KitchenUse BiPAP nightly and PRN during the day  12)Generalized weakness/debility--- patient having difficulties with mobility related activities of daily living due to hypoxia... PT and OT eval appreciated, PT recommends outpatient PT post discharge  13)Depression--- stable, continue Zoloft at 25 mg daily  Disposition/Need for in-Hospital Stay- patient unable to be discharged at this time due to  respiratory failure with hypoxia-- Developed volume overload/CHF exacerbation with worsening hypoxia currently getting IV diuresis-  Hypoxia improving patient is down to 2 L of oxygen via nasal cannula Possible discharge home in 1 to 2 days if continues to improve-repeat chest x-ray pending for 4/8//2020  Code Status : Full   Family Communication:   Husband Mr Peppers updated by phone  Disposition Plan  : Possible discharge home in 1 to 2 days with outpatient physical therapy  Consults  :  na  DVT Prophylaxis  :- Heparin -  Lab Results  Component Value Date   PLT 272 12/01/2018   Inpatient  Medications  Scheduled Meds:  carvedilol  25 mg Oral BID WC   cloNIDine  0.1 mg Oral TID   furosemide  40 mg Intravenous BID   heparin  5,000 Units Subcutaneous Q8H   hydrALAZINE  100 mg Oral TID   insulin aspart  0-5 Units Subcutaneous QHS   insulin aspart  0-9 Units Subcutaneous TID WC   insulin glargine  13 Units Subcutaneous BID   isosorbide mononitrate  120 mg Oral Daily   NIFEdipine  90 mg Oral Daily   pantoprazole  40 mg Oral Daily   sertraline  25 mg Oral Daily   sodium chloride  2 spray Each Nare QID   sodium chloride flush  3 mL Intravenous Q12H   Continuous Infusions:  PRN Meds:.acetaminophen **OR** acetaminophen, albuterol, HYDROcodone-acetaminophen, ipratropium-albuterol, labetalol, lip balm, Muscle Rub, ondansetron **OR** ondansetron (ZOFRAN) IV    Anti-infectives (From admission, onward)   Start     Dose/Rate Route Frequency Ordered Stop   11/28/18 1000  amoxicillin-clavulanate (AUGMENTIN) 500-125 MG per tablet 500 mg  Status:  Discontinued     1 tablet Oral Every 12 hours 11/27/18 1428 11/27/18 1450   11/28/18 1000  azithromycin (ZITHROMAX) tablet 500 mg     500 mg Oral  Once 11/27/18 1428 11/28/18 0904   11/27/18 1500  amoxicillin-clavulanate (AUGMENTIN) 500-125 MG per tablet 500 mg     1 tablet Oral Every 12 hours 11/27/18 1450 11/29/18 2203   11/25/18 1000  hydroxychloroquine (PLAQUENIL) tablet 200 mg  Status:  Discontinued     200 mg Oral 2 times daily 11/24/18 1314 11/24/18 1612   11/24/18 1315  hydroxychloroquine (PLAQUENIL) tablet 400 mg  Status:  Discontinued     400 mg Oral 2 times daily 11/24/18 1314 11/24/18 1612   11/24/18 1000  cefTRIAXone (ROCEPHIN) 1 g in sodium chloride 0.9 % 100 mL IVPB  Status:  Discontinued     1 g 200 mL/hr over 30 Minutes Intravenous Daily 11/24/18 0200 11/27/18 1426   11/24/18 1000  azithromycin (ZITHROMAX) 500 mg in sodium chloride 0.9 % 250 mL IVPB  Status:  Discontinued     500 mg 250 mL/hr over 60  Minutes Intravenous Every 24 hours 11/24/18 0200 11/27/18 1426        Objective:   Vitals:   12/01/18 0500 12/01/18 0620 12/01/18 1009 12/01/18 1012  BP:  (!) 158/51 (!) 159/52   Pulse:  67  70  Resp:      Temp:  98 F (36.7 C)    TempSrc:  Oral    SpO2:  96%    Weight: 80 kg     Height:        Wt Readings from Last 3 Encounters:  12/01/18 80 kg  04/29/18 79.9 kg  04/29/17 79.3 kg     Intake/Output Summary (Last 24 hours) at 12/01/2018 1502 Last  data filed at 12/01/2018 0749 Gross per 24 hour  Intake --  Output 1850 ml  Net -1850 ml   Physical Exam Patient is examined daily including today on 12/01/18 , exams remain the same as of yesterday except that has changed   Gen:- Awake Alert, able to speak in sentences  HEENT:- [place].AT, No sclera icterus Nose- [place] 2 L/min Neck-Supple Neck,  Lungs-no significant wheezing, with faint bibasilar rales, improving air movement   CV- S1, S2 normal, regular  Abd-  +ve B.Sounds, Abd Soft, No tenderness,    Extremity/Skin:- Trace edema, pedal pulses present  Psych-affect is appropriate, oriented x3 Neuro-Generalized Weakness, No new Focal deficits, no Tremors   Data Review:   Micro Results Recent Results (from the past 240 hour(s))  Culture, blood (routine x 2)     Status: None   Collection Time: 11/24/18  6:23 PM  Result Value Ref Range Status   Specimen Description BLOOD LEFT ANTECUBITAL  Final   Special Requests   Final    BOTTLES DRAWN AEROBIC AND ANAEROBIC Blood Culture adequate volume   Culture   Final    NO GROWTH 5 DAYS Performed at [facility], [place] [address], [place], [place] [address]    Report Status 11/29/2018 FINAL  Final  Culture, blood (routine x 2)     Status: None   Collection Time: 11/24/18  6:49 PM  Result Value Ref Range Status   Specimen Description BLOOD LEFT HAND  Final   Special Requests   Final    BOTTLES DRAWN AEROBIC AND ANAEROBIC Blood Culture results may not be optimal due to an excessive  volume of blood received in culture bottles   Culture   Final    NO GROWTH 5 DAYS Performed at [facility], [place] [address] [place], [place] [address]    Report Status 11/29/2018 FINAL  Final  Culture, Urine     Status: Abnormal   Collection Time: 11/24/18 11:28 PM  Result Value Ref Range Status   Specimen Description URINE, RANDOM  Final   Special Requests   Final    NONE Performed at [facility], [place] [address] [place], [place] [address]    Culture 20,000 COLONIES/mL ENTEROCOCCUS FAECALIS (A)  Final   Report Status 11/27/2018 FINAL  Final   Organism ID, Bacteria ENTEROCOCCUS FAECALIS (A)  Final      Susceptibility   Enterococcus faecalis - MIC*    AMPICILLIN <=2 SENSITIVE Sensitive     LEVOFLOXACIN 1 SENSITIVE Sensitive     NITROFURANTOIN <=16 SENSITIVE Sensitive     VANCOMYCIN 1 SENSITIVE Sensitive     * 20,000 COLONIES/mL ENTEROCOCCUS FAECALIS    Radiology Reports Dg Chest 2 View  Result Date: 11/30/2018 CLINICAL DATA:  Shortness of breath EXAM: CHEST - 2 VIEW COMPARISON:  11/28/2018 FINDINGS: Interstitial and alveolar airspace opacities in the right lung. Bilateral lower lobe airspace disease. Small right pleural effusion. Right basilar airspace disease. No pneumothorax. Stable cardiomediastinal silhouette. No aggressive osseous lesion. IMPRESSION: Bilateral lower lobe airspace disease which may reflect atelectasis versus pneumonia. Interstitial thickening more severe in the right lung with a small right pleural effusion. This may reflect interstitial edema versus infectious etiology. Electronically Signed   By: Kathreen Devoid   On: 11/30/2018 08:04   Dg Chest 2 View  Result Date: 11/28/2018 CLINICAL DATA:  Short of breath. Follow-up exam. EXAM: CHEST - 2 VIEW COMPARISON:  11/27/2018 and 11/23/2018 FINDINGS: Allowing for differences in technique and  patient positioning, there has been no significant interval change from the most recent prior exam. Right larger than  left pleural effusions, bilateral interstitial lung opacities and right-sided, centrally predominant and lower lung zone airspace opacities are stable. No new lung abnormalities. No pneumothorax. IMPRESSION: 1. No significant interval change from the most recent prior study. 2. Persistent pleural effusions, bilateral interstitial lung opacities and right-sided airspace opacities. Electronically Signed   By: Lajean Manes M.D.   On: 11/28/2018 07:27   Dg Chest Port 1 View  Result Date: 11/27/2018 CLINICAL DATA:  Increasing oxygen demand EXAM: PORTABLE CHEST 1 VIEW COMPARISON:  11/23/2018 FINDINGS: Diffuse interstitial opacity with Kerley lines and pleural effusions. There is alveolar opacity asymmetric to the right. Borderline heart size. IMPRESSION: New interstitial and airspace opacity with pleural effusions, a CHF pattern. Pneumonia was suspected on preceding radiograph and would be obscured. Electronically Signed   By: Monte Fantasia M.D.   On: 11/27/2018 04:21     CBC Recent Labs  Lab 11/25/18 0304 11/26/18 0432 11/27/18 1055 11/28/18 0301 11/29/18 0631 12/01/18 0219  WBC 11.7* 13.3* 11.8* 13.2* 14.0* 10.7*  HGB 7.5* 7.7* 7.0* 6.9* 8.9* 8.2*  HCT 24.1* 24.2* 22.7* 22.0* 27.4* 25.9*  PLT 178 263 254 279 279 272  MCV 76.5* 76.6* 76.9* 78.6* 77.8* 77.5*  MCH 23.8* 24.4* 23.7* 24.6* 25.3* 24.6*  MCHC 31.1 31.8 30.8 31.4 32.5 31.7  RDW 14.8 14.7 15.1 15.2 15.3 15.5  LYMPHSABS 1.2  --   --   --   --   --   MONOABS 1.2*  --   --   --   --   --   EOSABS 0.2  --   --   --   --   --   BASOSABS 0.0  --   --   --   --   --     Chemistries  Recent Labs  Lab 11/27/18 1055 11/28/18 0301 11/29/18 0631 11/30/18 0421 12/01/18 0219  NA 134* 136 136 137 136  K 4.5 4.7 4.0 4.0 3.7  CL 106 107 104 104 104  CO2 20* 20* 22 21* 23  GLUCOSE 231* 156* 148* 111* 120*  BUN 65* 65* 61* 57* 53*  CREATININE 3.14* 3.27* 3.28* 3.09* 2.94*  CALCIUM 8.0* 8.2* 8.7* 8.5* 8.3*  AST  --  20 18  --   --    ALT  --  23 21  --   --   ALKPHOS  --  68 73  --   --   BILITOT  --  0.3 0.6  --   --    Coagulation profile No results for input(s): INR, PROTIME in the last 168 hours.  No results for input(s): DDIMER in the last 72 hours.  Cardiac Enzymes No results for input(s): CKMB, TROPONINI, MYOGLOBIN in the last 168 hours.  Invalid input(s): CK ------------------------------------------------------------------------------------------------------------------    Component Value Date/Time   BNP 427.5 (H) 11/27/2018 1055     Roxan Hockey M.D on 12/01/2018 at 3:02 PM  Go to [email or web address] - for contact info  [facility] - Office  [phone]

## 2018-12-01 NOTE — Evaluation (Signed)
Occupational Therapy Evaluation Patient Details Name: Leah Walsh MRN: [id number] DOB: 12-Apr-1941 Today's Date: 12/01/2018    History of Present Illness 78 yo F with history of sleep apnea, kidney disease, HTN, hyperlipidemia, heart attack, DM, asthma, ovarian tumor removal, carotid stent. Fell after vomiting at home and diagnosed with pneumonia.    Clinical Impression   PTA patient independent.  Admitted for above and limited by problem list below, including generalized weakness, decreased activity tolerance, and impaired balance.  Patient currently able to complete UB ADLs with supervision, LB ADls with min guard, grooming standing at sink with min guard and transfers with min guard. On 2L supplemental oxygen via [place] with saturations maintained throughout session (even when removed during grooming tasks).  Patient highly motivated and anticipate she will progress well.  Will continue to follow while admitted to maximize independence and safety with ADLs, IADls.       Follow Up Recommendations  No OT follow up;Supervision - Intermittent    Equipment Recommendations  None recommended by OT    Recommendations for Other Services       Precautions / Restrictions Precautions Precautions: Fall Precaution Comments: Fall risk improving Restrictions Weight Bearing Restrictions: No      Mobility Bed Mobility Overal bed mobility: Independent                Transfers Overall transfer level: Needs assistance   Transfers: Sit to/from Stand Sit to Stand: Min guard         General transfer comment: min guard for safety and balance    Balance Overall balance assessment: Needs assistance   Sitting balance-Leahy Scale: Normal     Standing balance support: No upper extremity supported;During functional activity Standing balance-Leahy Scale: Fair Standing balance comment: min guard for safety                            ADL either performed or assessed with clinical  judgement   ADL Overall ADL's : Needs assistance/impaired     Grooming: Wash/dry hands;Wash/dry face;Min guard;Standing   Upper Body Bathing: Supervision/ safety;Set up;Sitting   Lower Body Bathing: Min guard;Sit to/from stand   Upper Body Dressing : Set up;Sitting   Lower Body Dressing: Min guard;Sit to/from stand   Toilet Transfer: Min guard;Ambulation Toilet Transfer Details (indicate cue type and reason): simulated in room         Functional mobility during ADLs: Min guard General ADL Comments: limited by generalized weakness and impaired balance      Vision   Vision Assessment?: No apparent visual deficits     Perception     Praxis      Pertinent Vitals/Pain Pain Assessment: Faces Faces Pain Scale: No hurt     Hand Dominance     Extremity/Trunk Assessment Upper Extremity Assessment Upper Extremity Assessment: Generalized weakness   Lower Extremity Assessment Lower Extremity Assessment: Defer to PT evaluation       Communication Communication Communication: No difficulties   Cognition Arousal/Alertness: Awake/alert Behavior During Therapy: WFL for tasks assessed/performed Overall Cognitive Status: Within Functional Limits for tasks assessed                                     General Comments  Pt on 2L supplemental oxgyen via  with saturations maintained (even maintained when removed during grooming tasks)    Exercises  Shoulder Instructions      Home Living Family/patient expects to be discharged to:: Private residence Living Arrangements: Spouse/significant other Available Help at Discharge: Family Type of Home: House Home Access: Stairs to enter Technical brewer of Steps: 6-10   Home Layout: One level     Bathroom Shower/Tub: Occupational psychologist: Hurt: [place] in          Prior Functioning/Environment Level of Independence: Independent                  OT Problem List: Decreased strength;Decreased activity tolerance;Impaired balance (sitting and/or standing);Decreased knowledge of use of DME or AE;Decreased knowledge of precautions      OT Treatment/Interventions: Self-care/ADL training;Energy conservation;DME and/or AE instruction;Therapeutic exercise;Balance training;Patient/family education;Therapeutic activities    OT Goals(Current goals can be found in the care plan section) Acute Rehab OT Goals Patient Stated Goal: not be on oxygen OT Goal Formulation: With patient Time For Goal Achievement: 12/15/18 Potential to Achieve Goals: Good  OT Frequency: Min 2X/week   Barriers to D/C:            Co-evaluation              AM-PAC OT "6 Clicks" Daily Activity     Outcome Measure Help from another person eating meals?: None Help from another person taking care of personal grooming?: A Little Help from another person toileting, which includes using toliet, bedpan, or urinal?: A Little Help from another person bathing (including washing, rinsing, drying)?: A Little Help from another person to put on and taking off regular upper body clothing?: None Help from another person to put on and taking off regular lower body clothing?: A Little 6 Click Score: 20   End of Session Equipment Utilized During Treatment: Oxygen Nurse Communication: Mobility status  Activity Tolerance: Patient tolerated treatment well Patient left: in bed;with call bell/phone within reach;with bed alarm set  OT Visit Diagnosis: Other abnormalities of gait and mobility (R26.89);Muscle weakness (generalized) (M62.81)                Time: 5465-6812 OT Time Calculation (min): 21 min Charges:  OT General Charges $OT Visit: 1 Visit OT Evaluation $OT Eval Moderate Complexity: 1 Mod  Delight Stare, OT Acute Rehabilitation Services Pager [phone] Office [phone]    Delight Stare 12/01/2018, 10:14 AM

## 2018-12-01 NOTE — Progress Notes (Signed)
Physical Therapy Treatment Patient Details Name: Leah Walsh MRN: [id number] DOB: 02/19/1941 Today's Date: 12/01/2018    History of Present Illness 78 yo F with history of sleep apnea, kidney disease, HTN, hyperlipidemia, heart attack, DM, asthma, ovarian tumor removal, carotid stent. Fell after vomiting at home and diagnosed with pneumonia.     PT Comments    Pt not as steady today with ambulation as she was yesterday, when ambulating in room she became dizzy and lost balance. Took seated rest break and ambulated several more times with and without RW and she did not c/o dizziness but lost balance 3 more times when not using RW. Given HHA to finish out ambulation to prevent falling. SpO2 ranged 89-95% on RA and HR stable. Discussed the need for pt to have someone with her overnight when she goes home as well as changing rec to HHPT from oupt PT due to finding out that pt's husband broke his leg the day she came in and will be having surgery tomorrow and will not be able to drive her places, daughters will need to take her to appts. Question after today's session if she should be going home but will leave rec at [facility] for now. PT will continue to follow.   Follow Up Recommendations  Home health PT     Equipment Recommendations  None recommended by PT    Recommendations for Other Services       Precautions / Restrictions Precautions Precautions: Fall Restrictions Weight Bearing Restrictions: No    Mobility  Bed Mobility Overal bed mobility: Independent                Transfers Overall transfer level: Needs assistance   Transfers: Sit to/from Stand Sit to Stand: Min guard         General transfer comment: min guard for safety and balance, lost balance with 2 episodes of initial standing  Ambulation/Gait Ambulation/Gait assistance: Min guard;Min assist Gait Distance (Feet): 50 Feet Assistive device: None;Rolling walker (2 wheeled) Gait Pattern/deviations:  Step-through pattern Gait velocity: decreased Gait velocity interpretation: <1.8 ft/sec, indicate of risk for recurrent falls General Gait Details: pt unsteady with initial walking and reported feeling dizzy, took seated rest break. SpO2 89% on RA. Used RW next round through room and then set it to the side and though pt had not lost balance with RW, she immediately lost her balance without it, min A to correct. Second seated rest break SpO2 92%. Last 2 rounds pt lost her balance multiple times with min HHA to prevent falling.    Stairs             Wheelchair Mobility    Modified Rankin (Stroke Patients Only)       Balance Overall balance assessment: Needs assistance Sitting-balance support: No upper extremity supported Sitting balance-Leahy Scale: Normal     Standing balance support: No upper extremity supported;During functional activity Standing balance-Leahy Scale: Fair Standing balance comment: fair in static standing but occasionally needs UE support for safety with dynamic activity               High Level Balance Comments: worked on SL stance at sink and proprioceptive input in standing            Cognition Arousal/Alertness: Awake/alert Behavior During Therapy: WFL for tasks assessed/performed Overall Cognitive Status: No family/caregiver present to determine baseline cognitive functioning  General Comments: pt repeated self several times during session and asked me if I knew she was going home tomorrow after she had told me herself twice. Questions is this baseline or new?      Exercises General Exercises - Lower Extremity Hip Flexion/Marching: AROM;Both;10 reps;Standing Heel Raises: AROM;Both;10 reps;Standing    General Comments General comments (skin integrity, edema, etc.): HR stable throughout. Discussed with pt that she needs to use her rollator and not stay alone overnight      Pertinent  Vitals/Pain Pain Assessment: Faces Faces Pain Scale: Hurts little more Pain Location: buttocks Pain Descriptors / Indicators: Discomfort Pain Intervention(s): Monitored during session;Repositioned    Home Living                      Prior Function            PT Goals (current goals can now be found in the care plan section) Acute Rehab PT Goals Patient Stated Goal: not be on oxygen PT Goal Formulation: With patient Time For Goal Achievement: 12/10/18 Potential to Achieve Goals: Good Progress towards PT goals: Not progressing toward goals - comment(unsteady today)    Frequency    Min 3X/week      PT Plan Frequency needs to be updated;Discharge plan needs to be updated    Co-evaluation              AM-PAC PT "6 Clicks" Mobility   Outcome Measure  Help needed turning from your back to your side while in a flat bed without using bedrails?: None Help needed moving from lying on your back to sitting on the side of a flat bed without using bedrails?: None Help needed moving to and from a bed to a chair (including a wheelchair)?: A Little Help needed standing up from a chair using your arms (e.g., wheelchair or bedside chair)?: A Little Help needed to walk in hospital room?: A Little Help needed climbing 3-5 steps with a railing? : A Lot 6 Click Score: 19    End of Session   Activity Tolerance: Other (comment)(limited by dizziness and imbalance) Patient left: in bed;with call bell/phone within reach;with bed alarm set Nurse Communication: Mobility status PT Visit Diagnosis: Unsteadiness on feet (R26.81);Other abnormalities of gait and mobility (R26.89);Muscle weakness (generalized) (M62.81);Difficulty in walking, not elsewhere classified (R26.2)     Time: 7353-2992 PT Time Calculation (min) (ACUTE ONLY): 24 min  Charges:  $Gait Training: 8-22 mins $Neuromuscular Re-education: 8-22 mins                     Leighton Roach, [place]   Pager [phone] Office [place] 12/01/2018, 5:02 PM

## 2018-12-01 NOTE — Care Management Important Message (Signed)
Important Message  Patient Details  Name: Leah Walsh MRN: [id number] Date of Birth: [birth year]   Medicare Important Message Given:  Yes  No signature obtain as a precaution is currently in place   [facility] 12/01/2018, 3:41 PM

## 2018-12-02 ENCOUNTER — Inpatient Hospital Stay (HOSPITAL_COMMUNITY): Payer: PPO

## 2018-12-02 LAB — CBC
HCT: 25.3 % — ABNORMAL LOW (ref 36.0–46.0)
Hemoglobin: 8.3 g/dL — ABNORMAL LOW (ref 12.0–15.0)
MCH: 25.2 pg — ABNORMAL LOW (ref 26.0–34.0)
MCHC: 32.8 g/dL (ref 30.0–36.0)
MCV: 76.7 fL — ABNORMAL LOW (ref 80.0–100.0)
Platelets: 272 10*3/uL (ref 150–400)
RBC: 3.3 MIL/uL — ABNORMAL LOW (ref 3.87–5.11)
RDW: 15.6 % — ABNORMAL HIGH (ref 11.5–15.5)
WBC: 9.9 10*3/uL (ref 4.0–10.5)
nRBC: 0 % (ref 0.0–0.2)

## 2018-12-02 LAB — BASIC METABOLIC PANEL
Anion gap: 8 (ref 5–15)
BUN: 46 mg/dL — ABNORMAL HIGH (ref 8–23)
CO2: 22 mmol/L (ref 22–32)
Calcium: 8.4 mg/dL — ABNORMAL LOW (ref 8.9–10.3)
Chloride: 104 mmol/L (ref 98–111)
Creatinine, Ser: 2.82 mg/dL — ABNORMAL HIGH (ref 0.44–1.00)
GFR calc Af Amer: 18 mL/min — ABNORMAL LOW (ref >60)
GFR calc non Af Amer: 16 mL/min — ABNORMAL LOW (ref >60)
Glucose, Bld: 148 mg/dL — ABNORMAL HIGH (ref 70–99)
Potassium: 3.5 mmol/L (ref 3.5–5.1)
Sodium: 134 mmol/L — ABNORMAL LOW (ref 135–145)

## 2018-12-02 LAB — GLUCOSE, CAPILLARY
Glucose-Capillary: 100 mg/dL — ABNORMAL HIGH (ref 70–99)
Glucose-Capillary: 152 mg/dL — ABNORMAL HIGH (ref 70–99)
Glucose-Capillary: 102 mg/dL — ABNORMAL HIGH (ref 70–99)
Glucose-Capillary: 200 mg/dL — ABNORMAL HIGH (ref 70–99)

## 2018-12-02 NOTE — Progress Notes (Signed)
Physical Therapy Treatment Patient Details Name: Leah Walsh MRN: [id number] DOB: Feb 17, 1941 Today's Date: 12/02/2018    History of Present Illness 78 yo F with history of sleep apnea, kidney disease, HTN, hyperlipidemia, heart attack, DM, asthma, ovarian tumor removal, carotid stent. Fell after vomiting at home and diagnosed with pneumonia.     PT Comments    Pt performed gait training and functional mobility during session.  She fatigues quickly and required min assistance.  Pt performed tx on RA.  Plan for HHPT remain appropriate.    Follow Up Recommendations  Home health PT     Equipment Recommendations  None recommended by PT    Recommendations for Other Services       Precautions / Restrictions Precautions Precautions: Fall Precaution Comments: Fall risk improving Restrictions Weight Bearing Restrictions: No    Mobility  Bed Mobility Overal bed mobility: Independent                Transfers Overall transfer level: Needs assistance Equipment used: Rolling walker (2 wheeled) Transfers: Sit to/from Stand Sit to Stand: Min guard         General transfer comment: min guard for safety and balance, lost balance with 2 episodes of initial standing  Ambulation/Gait Ambulation/Gait assistance: Min assist Gait Distance (Feet): 100 Feet Assistive device: Rolling walker (2 wheeled) Gait Pattern/deviations: Step-through pattern Gait velocity: decreased   General Gait Details: Pt remains weak and wobbly with gt.  required cues for posture.     Stairs             Wheelchair Mobility    Modified Rankin (Stroke Patients Only)       Balance Overall balance assessment: Needs assistance   Sitting balance-Leahy Scale: Normal       Standing balance-Leahy Scale: Fair Standing balance comment: dynamic standing at sink ~ 1 min                            Cognition Arousal/Alertness: Awake/alert Behavior During Therapy: WFL for tasks  assessed/performed Overall Cognitive Status: Within Functional Limits for tasks assessed                                        Exercises      General Comments General comments (skin integrity, edema, etc.): HR 70s with activity and >90% O2 on RA      Pertinent Vitals/Pain Pain Assessment: No/denies pain Pain Location: buttocks Pain Descriptors / Indicators: Discomfort Pain Intervention(s): Monitored during session;Repositioned    Home Living                      Prior Function            PT Goals (current goals can now be found in the care plan section) Acute Rehab PT Goals Patient Stated Goal: for her bottom to feel better Potential to Achieve Goals: Good Progress towards PT goals: Progressing toward goals    Frequency    Min 3X/week      PT Plan Current plan remains appropriate    Co-evaluation              AM-PAC PT "6 Clicks" Mobility   Outcome Measure  Help needed turning from your back to your side while in a flat bed without using bedrails?: None Help needed moving from lying on your  back to sitting on the side of a flat bed without using bedrails?: None Help needed moving to and from a bed to a chair (including a wheelchair)?: A Little Help needed standing up from a chair using your arms (e.g., wheelchair or bedside chair)?: A Little Help needed to walk in hospital room?: A Little Help needed climbing 3-5 steps with a railing? : A Lot 6 Click Score: 19    End of Session Equipment Utilized During Treatment: Gait belt Activity Tolerance: Patient tolerated treatment well Patient left: in bed;with call bell/phone within reach;with bed alarm set Nurse Communication: Mobility status PT Visit Diagnosis: Unsteadiness on feet (R26.81);Other abnormalities of gait and mobility (R26.89);Muscle weakness (generalized) (M62.81);Difficulty in walking, not elsewhere classified (R26.2)     Time: 8875-7972 PT Time Calculation (min)  (ACUTE ONLY): 32 min  Charges:  $Gait Training: 8-22 mins $Therapeutic Activity: 8-22 mins                     Governor Rooks, PTA Acute Rehabilitation Services Pager [phone] Office [phone]     Leah Walsh [name] 12/02/2018, 5:03 PM

## 2018-12-02 NOTE — Plan of Care (Signed)

## 2018-12-02 NOTE — Progress Notes (Signed)
Pt placed on BIPAP she states she agreed to only wear for 2 hours.

## 2018-12-02 NOTE — Plan of Care (Signed)
  Problem: Education: Goal: Knowledge of General Education information will improve Description Including pain rating scale, medication(s)/side effects and non-pharmacologic comfort measures 12/02/2018 0625 by Daniel Nones, RN Outcome: Progressing 12/02/2018 0622 by Daniel Nones, RN Outcome: Progressing   Problem: Health Behavior/Discharge Planning: Goal: Ability to manage health-related needs will improve 12/02/2018 0625 by Daniel Nones, RN Outcome: Progressing 12/02/2018 0622 by Daniel Nones, RN Outcome: Progressing   Problem: Clinical Measurements: Goal: Ability to maintain clinical measurements within normal limits will improve 12/02/2018 0625 by Daniel Nones, RN Outcome: Progressing 12/02/2018 0622 by Daniel Nones, RN Outcome: Progressing Goal: Will remain free from infection 12/02/2018 0625 by Daniel Nones, RN Outcome: Progressing 12/02/2018 0622 by Daniel Nones, RN Outcome: Progressing Goal: Diagnostic test results will improve 12/02/2018 0625 by Daniel Nones, RN Outcome: Progressing 12/02/2018 0622 by Daniel Nones, RN Outcome: Progressing Goal: Respiratory complications will improve 12/02/2018 0625 by Daniel Nones, RN Outcome: Progressing 12/02/2018 0622 by Daniel Nones, RN Outcome: Progressing Goal: Cardiovascular complication will be avoided 12/02/2018 0625 by Daniel Nones, RN Outcome: Progressing 12/02/2018 0622 by Daniel Nones, RN Outcome: Progressing   Problem: Activity: Goal: Risk for activity intolerance will decrease 12/02/2018 0625 by Daniel Nones, RN Outcome: Progressing 12/02/2018 0622 by Daniel Nones, RN Outcome: Progressing   Problem: Nutrition: Goal: Adequate nutrition will be maintained 12/02/2018 0625 by Daniel Nones, RN Outcome: Progressing 12/02/2018 0622 by Daniel Nones, RN Outcome: Progressing   Problem: Coping: Goal: Level of anxiety will decrease 12/02/2018 0625 by Daniel Nones, RN Outcome:  Progressing 12/02/2018 0622 by Daniel Nones, RN Outcome: Progressing   Problem: Elimination: Goal: Will not experience complications related to bowel motility 12/02/2018 0625 by Daniel Nones, RN Outcome: Progressing 12/02/2018 0622 by Daniel Nones, RN Outcome: Progressing Goal: Will not experience complications related to urinary retention 12/02/2018 0625 by Daniel Nones, RN Outcome: Progressing 12/02/2018 0622 by Daniel Nones, RN Outcome: Progressing   Problem: Safety: Goal: Ability to remain free from injury will improve 12/02/2018 0625 by Daniel Nones, RN Outcome: Progressing 12/02/2018 0622 by Daniel Nones, RN Outcome: Progressing   Problem: Skin Integrity: Goal: Risk for impaired skin integrity will decrease 12/02/2018 0625 by Daniel Nones, RN Outcome: Progressing 12/02/2018 0622 by Daniel Nones, RN Outcome: Progressing

## 2018-12-02 NOTE — Progress Notes (Addendum)
Progress Note    Matisha Termine  ZOX:[id number] DOB: 03/21/1941  DOA: 11/24/2018 PCP: [name], MD    Brief Narrative:   Chief complaint: Fever, shortness of breath, nausea, vomiting, and diarrhea  Medical records reviewed and are as summarized below:  Esraa Seres is an 78 y.o. female with past medical history significant for asthma, stage IV chronic kidney disease (follows with nephrology in [facility], baseline creatinine reportedly 1.8), HTN, HLD, IDDM, OSA presented to [facility] ED due to fever, nausea, vomiting, diarrhea and dyspnea. She developed the symptoms a week PTA, evaluated by PCP 11/18/2018, influenza PCR reportedly negative but sent for Covid-19testing. Due to persisting symptoms, she presented to the ED.  admission chest x-ray was consistent with right lower lobe pneumonia, she was transferred to [facility] for further evaluation. Covid-19ruled out already Pt has OSA and PTA was using CPAP nightly --- initially secondary to PNA , developed volume overloaded/CHF pattern--with IV diuretics clinically and radiologically volume overload appears to have improved   Assessment/Plan:   Principal Problem:   CAP (community acquired pneumonia) Active Problems:   Obstructive sleep apnea   Microcytic anemia   Asthma, mild persistent   Hypertension   Coronary artery disease involving native coronary artery of native heart without angina pectoris   Insulin-requiring or dependent type II diabetes mellitus ([place])   Acute renal failure superimposed on stage 4 chronic kidney disease (HCC)   Hypertensive urgency   Hyponatremia   Nausea vomiting and diarrhea   Elevated troponin   Acute Respiratory failure with hypoxia (HCC)  1)CAP/RLL PNA--- Covid-19 ruled outalready Fevers have resolved,  dyspnea and hypoxia improving with diuresis --patient remains not very compliant with BiPAP use--- -She was tested for COVID-19 by her PCP on 3/25/20and results are Negative faxed in  from PCPs so we Discontinued all COVID related isolations. PTA patient was not on home O2--- oxygen requirement improving with treatment of pneumonia and IV diuresis--- few days ago patient was requiring 10 L of oxygen via nasal cannula she is down to RA as of 12/02/2018.  unable to do echo given COVID with related limitations--- hypoxic respiratory failure was initially secondary to PNA , developed volume overloaded/CHF pattern--with IV diuretics clinically and radiologically volume overload appears to have improved ---dyspnea and hypoxia improving  fluid input and output monitoring, daily weights, continue BiPAP as needed during the day and nightly (at baseline patient has OSA and needs CPAP anyway)...   blood cultures x2: Negative to date. ... procalcitonin down from 14 to 2.1. Urine pneumococcal antigen negative.  WBC is 10.4 -Treated with IV ceftriaxone and azithromycin from 11/23/18 (started at [facility]) thru 11/27/18, okay to complete Augmentin due to enterococcus in the urine and also to cover respiratory bugs -Will need follow-up chest x-ray in 4 weeks to ensure resolution of pneumonia findings.    2.Acute kidney injury Superimposed on stage IV CKD -SCr is 3.30 on admission, up from 1.81 in December -Likely prerenal azotemia in setting of recent N/V/D; fortunately, vomiting and diarrhea appears to have resolved  renally-dose medications, avoid nephrotoxins,   Follows outpatient with nephrology in [facility], monitor renal function closely with IV diuresis.  Creatinine trending down, currently down to 2.9  3)Volume Overload/Acute CHF CHF----overall improving as noted above #1 , EF not available, unknown at this time if diastolic Vs Systolic , oxygen requirement is improving (please see #1 above),, unable to do echo given COVID with related limitations--- developed volume overloaded/CHF pattern--please see #1 above, BNP  is 427.5,- continue BiPAP as needed during the day and nightly (at baseline  patient has OSA and needs CPAP anyway).  Chest x-ray on 12/02/2018 showing improving aeration.  Weight is 74.8kg which is slightly lower patient's baseline --continue IV Lasix to 40 mg twice daily  fluid balance  Is Neg > 1.85 L overnight for a total of > 6 L in the last 72 hours     4)DM2-- oral intake is not great,  c/n Lantus insulin at reduced dose of 13 units twice daily from 15 units.....  Use Novolog/Humalog Sliding scale insulin with Accu-Cheks/Fingersticks as ordered    5.Elevated troponin -Troponin was 0.08 (nl <0.04) in the [facility] ED..... Troponin elevation not consistent with ACS pattern, unable to do echocardiogram to rule out regional wall motion abnormalities or evaluate EF  Due to COVID related situation -There are no anginal complaints on admission and she denies any recent chest pain -Likely related to acute on chronic kidney disease and acute illness.  6)HTN--- BP improving, c/n clonidine 0.1 mg 3 times daily,c/n  isosorbide to 120 mg daily, continue Coreg at increased dose of  25 mg p.o. twice daily, continue hydralazine 100 mg 3 times daily, continue Procardia XL 90 mg daily, use IV labetalol as needed elevated BP continue to hold losartan/HCTZ due to kidney concerns  7)OP Blood culture positive for gram-positive rod and anaerobic bottle (? Contaminant): Blood culture sent from [facility] on 11/24/18, negative to date. -- patient was treated with IV Rocephin and IV azithromycin starting 11/23/2018 at [facility], Rocephin and azithromycin was continued from 3/31/202o to  11/27/2018 here at [facility],  switched to p.o. Augmentin on 11/27/2018 (for 5 days) to allow for coverage of enterococcus in the urine as well  8)Acute on chronic Anemia, at baseline patient has chronic anemia of CKD----baseline hemoglobin hemoglobin 9.6 in December 2019,   hemoglobin is up to 8.2 from  6.9, after transfusion of 1 unit of PRBC on 11/28/2018,No overt bleeding. ..... Patient may benefit  from EPO/Procrit given anemia of CKD..... H&H and transfuse as clinically indicated  9)OSA---- Patient was using CPAP---at Home every Bedtime due to OSA (Obstructive Sleep Apnea)...Marland KitchenUse BiPAP nightly and PRN during the day--patient is not very compliant with BiPAP  10)Urine Culture with Enterococcus-okay to complete Augmentin (started 11/27/18) for 5 days completed  11)Acute Hypoxic Respiratory failure--- initially secondary to PNA , developed volume overloaded/CHF pattern--with IV diuretics clinically and radiologically volume overload appears to have improved --- please see #1 and  #3 above...  Patient was using CPAP---at Home every Bedtime due to OSA (Obstructive Sleep Apnea)...Marland KitchenUse BiPAP nightly and PRN during the day  12)Generalized weakness/debility--- patient having difficulties with mobility related activities of daily living due to hypoxia... PT and OT eval appreciated, PT recommends outpatient PT post discharge  13)Depression--- stable, continue Zoloft at 25 mg daily  Body mass index is 26.82 kg/m.   Family Communication/Anticipated D/C date and plan/Code Status   DVT prophylaxis: Lovenox ordered. Code Status: Full Code.  Family Communication: No family present Disposition Plan: home   Medical Consultants:    N/A   Anti-Infectives:    completed  Subjective:   Patient reports that she has not been coughing much.  However, her husband has back surgery scheduled for tomorrow and wonders what she will do as he will not be able to help her.  Despite this she does want to go home.  Objective:    Vitals:   12/02/18 0223 12/02/18 0945 12/02/18 1326  12/02/18 1516  BP:  (!) 153/44 (!) 148/48 (!) 136/57  Pulse: 74 62 66 65  Resp: 15  18   Temp:   99 F (37.2 C)   TempSrc:   Oral   SpO2:   96% 95%  Weight:      Height:        Intake/Output Summary (Last 24 hours) at 12/02/2018 1630 Last data filed at 12/02/2018 0009 Gross per 24 hour  Intake -  Output 350 ml   Net -350 ml   Filed Weights   11/29/18 0514 11/30/18 0500 12/01/18 0500  Weight: 77.5 kg 80.1 kg 80 kg    Exam: Constitutional: Elderly female NAD, calm, comfortable Eyes: PERRL, lids and conjunctivae normal ENMT: Mucous membranes are moist. Posterior pharynx clear of any exudate or lesions. Neck: normal, supple, no masses, no thyromegaly Respiratory: Positive bibasilar crackles.  No wheezing, no crackles. Normal respiratory effort.   Cardiovascular: Regular rate and rhythm, no murmurs / rubs / gallops. No extremity edema. 2+ pedal pulses. No carotid bruits.  Abdomen: no tenderness, no masses palpated. No hepatosplenomegaly. Bowel sounds positive.  Musculoskeletal: no clubbing / cyanosis. No joint deformity upper and lower extremities. Good ROM, no contractures. Normal muscle tone.  Skin: Alopecia.  No lesions appreciated Neurologic: CN 2-12 grossly intact. Sensation intact, DTR normal. Strength 5/5 in all 4.  Psychiatric: Normal judgment and insight. Alert and oriented x 3. Normal mood.    Data Reviewed:   I have personally reviewed following labs and imaging studies:  Labs: Labs show the following:   Basic Metabolic Panel: Recent Labs  Lab 11/26/18 0432  11/28/18 0301 11/29/18 0631 11/30/18 0421 12/01/18 0219 12/02/18 0247  NA 135   < > 136 136 137 136 134*  K 3.2*   < > 4.7 4.0 4.0 3.7 3.5  CL 105   < > 107 104 104 104 104  CO2 21*   < > 20* 22 21* 23 22  GLUCOSE 69*   < > 156* 148* 111* 120* 148*  BUN 70*   < > 65* 61* 57* 53* 46*  CREATININE 3.20*   < > 3.27* 3.28* 3.09* 2.94* 2.82*  CALCIUM 7.8*   < > 8.2* 8.7* 8.5* 8.3* 8.4*  PHOS 4.0  --   --   --   --   --   --    < > = values in this interval not displayed.   GFR Estimated Creatinine Clearance: 18.8 mL/min (A) (by C-G formula based on SCr of 2.82 mg/dL (H)). Liver Function Tests: Recent Labs  Lab 11/26/18 0432 11/28/18 0301 11/29/18 0631  AST  --  20 18  ALT  --  23 21  ALKPHOS  --  68 73   BILITOT  --  0.3 0.6  PROT  --  5.5* 5.7*  ALBUMIN 2.3* 2.3* 2.5*   No results for input(s): LIPASE, AMYLASE in the last 168 hours. No results for input(s): AMMONIA in the last 168 hours. Coagulation profile No results for input(s): INR, PROTIME in the last 168 hours.  CBC: Recent Labs  Lab 11/27/18 1055 11/28/18 0301 11/29/18 0631 12/01/18 0219 12/02/18 0247  WBC 11.8* 13.2* 14.0* 10.7* 9.9  HGB 7.0* 6.9* 8.9* 8.2* 8.3*  HCT 22.7* 22.0* 27.4* 25.9* 25.3*  MCV 76.9* 78.6* 77.8* 77.5* 76.7*  PLT 254 279 279 272 272   Cardiac Enzymes: No results for input(s): CKTOTAL, CKMB, CKMBINDEX, TROPONINI in the last 168 hours. BNP (last 3 results) No results  for input(s): PROBNP in the last 8760 hours. CBG: Recent Labs  Lab 12/01/18 1156 12/01/18 1723 12/01/18 2131 12/02/18 0804 12/02/18 1134  GLUCAP 161* 112* 178* 100* 152*   D-Dimer: No results for input(s): DDIMER in the last 72 hours. Hgb A1c: No results for input(s): HGBA1C in the last 72 hours. Lipid Profile: No results for input(s): CHOL, HDL, LDLCALC, TRIG, CHOLHDL, LDLDIRECT in the last 72 hours. Thyroid function studies: No results for input(s): TSH, T4TOTAL, T3FREE, THYROIDAB in the last 72 hours.  Invalid input(s): FREET3 Anemia work up: No results for input(s): VITAMINB12, FOLATE, FERRITIN, TIBC, IRON, RETICCTPCT in the last 72 hours. Sepsis Labs: Recent Labs  Lab 11/27/18 1055 11/28/18 0301 11/29/18 0631 12/01/18 0219 12/02/18 0247  PROCALCITON 2.14  --   --   --   --   WBC 11.8* 13.2* 14.0* 10.7* 9.9    Microbiology Recent Results (from the past 240 hour(s))  Culture, blood (routine x 2)     Status: None   Collection Time: 11/24/18  6:23 PM  Result Value Ref Range Status   Specimen Description BLOOD LEFT ANTECUBITAL  Final   Special Requests   Final    BOTTLES DRAWN AEROBIC AND ANAEROBIC Blood Culture adequate volume   Culture   Final    NO GROWTH 5 DAYS Performed at [facility],  [address], [place], [place] [address]    Report Status 11/29/2018 FINAL  Final  Culture, blood (routine x 2)     Status: None   Collection Time: 11/24/18  6:49 PM  Result Value Ref Range Status   Specimen Description BLOOD LEFT HAND  Final   Special Requests   Final    BOTTLES DRAWN AEROBIC AND ANAEROBIC Blood Culture results may not be optimal due to an excessive volume of blood received in culture bottles   Culture   Final    NO GROWTH 5 DAYS Performed at [facility], [place] [address] [place], [place] [address]    Report Status 11/29/2018 FINAL  Final  Culture, Urine     Status: Abnormal   Collection Time: 11/24/18 11:28 PM  Result Value Ref Range Status   Specimen Description URINE, RANDOM  Final   Special Requests   Final    NONE Performed at [facility], [place] [address], [place], [place] [address]    Culture 20,000 COLONIES/mL ENTEROCOCCUS FAECALIS (A)  Final   Report Status 11/27/2018 FINAL  Final   Organism ID, Bacteria ENTEROCOCCUS FAECALIS (A)  Final      Susceptibility   Enterococcus faecalis - MIC*    AMPICILLIN <=2 SENSITIVE Sensitive     LEVOFLOXACIN 1 SENSITIVE Sensitive     NITROFURANTOIN <=16 SENSITIVE Sensitive     VANCOMYCIN 1 SENSITIVE Sensitive     * 20,000 COLONIES/mL ENTEROCOCCUS FAECALIS    Procedures and diagnostic studies:  Dg Chest 2 View  Result Date: 12/02/2018 CLINICAL DATA:  77 year old female with shortness of breath EXAM: CHEST - 2 VIEW COMPARISON:  11/30/2018, 11/28/2018, 11/27/2018 FINDINGS: Cardiomediastinal silhouette unchanged in size and contour. No pneumothorax. Opacity at the right lung base persists with obscuration the right hemidiaphragm. Blunting of left costophrenic angle. Improving aeration in the right mid lung with no new confluent airspace disease. IMPRESSION: Improving aeration of the right upper and mid lung with persisting opacity at the right lung base, potentially combination of pleural fluid and  consolidation/atelectasis. Blunting at the left costophrenic angle likely represents small pleural fluid and  atelectasis Electronically Signed   By: Corrie Mckusick D.O.   On: 12/02/2018 08:18    Medications:   . carvedilol  25 mg Oral BID WC  . cloNIDine  0.1 mg Oral TID  . furosemide  40 mg Intravenous BID  . heparin  5,000 Units Subcutaneous Q8H  . hydrALAZINE  100 mg Oral TID  . insulin aspart  0-5 Units Subcutaneous QHS  . insulin aspart  0-9 Units Subcutaneous TID WC  . insulin glargine  13 Units Subcutaneous BID  . isosorbide mononitrate  120 mg Oral Daily  . NIFEdipine  90 mg Oral Daily  . pantoprazole  40 mg Oral Daily  . sertraline  25 mg Oral Daily  . sodium chloride  2 spray Each Nare QID  . sodium chloride flush  3 mL Intravenous Q12H   Continuous Infusions:   LOS: 8 days   Kashmir Lysaght A Stellarose Cerny  [facility]   *Please refer to [email or web address], password [email or web address] to get updated schedule on who will round on this patient, as hospitalists switch teams weekly. If 7PM-7AM, please contact night-coverage at [email or web address], password [email or web address] for any overnight needs.

## 2018-12-02 NOTE — Progress Notes (Signed)
Occupational Therapy Treatment Patient Details Name: Leah Walsh MRN: [id number] DOB: 23-Nov-1940 Today's Date: 12/02/2018    History of present illness 78 yo F with history of sleep apnea, kidney disease, HTN, hyperlipidemia, heart attack, DM, asthma, ovarian tumor removal, carotid stent. Fell after vomiting at home and diagnosed with pneumonia.    OT comments  Pt performing ADL at sink mostly seated with set-upA and supervision for standing time. Pt performing entire bathing routine with increased time and reports having LH sponge, but was assisted with back washing by OTR as she was unable to reach. Pt performing ADL functional mobility in room with no AD and SpO2 remains >90% on RA. Pt's HR ~70s all day. Pt progressing. OT to continue to follow acutely.   Follow Up Recommendations  No OT follow up;Supervision - Intermittent    Equipment Recommendations  None recommended by OT    Recommendations for Other Services      Precautions / Restrictions Precautions Precautions: Fall Restrictions Weight Bearing Restrictions: No       Mobility Bed Mobility Overal bed mobility: Independent                Transfers Overall transfer level: Needs assistance Equipment used: Rolling walker (2 wheeled) Transfers: Sit to/from Stand Sit to Stand: Min guard         General transfer comment: min guard for safety and balance, lost balance with 2 episodes of initial standing    Balance Overall balance assessment: Needs assistance   Sitting balance-Leahy Scale: Normal       Standing balance-Leahy Scale: Fair Standing balance comment: dynamic standing at sink ~ 1 min                           ADL either performed or assessed with clinical judgement   ADL Overall ADL's : Needs assistance/impaired     Grooming: Set up;Sitting   Upper Body Bathing: Supervision/ safety;Set up;Sitting   Lower Body Bathing: Min guard;Sit to/from stand   Upper Body Dressing : Set  up;Sitting   Lower Body Dressing: Min guard;Sit to/from stand   Toilet Transfer: Min guard;Ambulation   Toileting- Clothing Manipulation and Hygiene: Min guard;Sitting/lateral lean       Functional mobility during ADLs: Min guard General ADL Comments: limited by generalized weakness and impaired balance      Vision   Vision Assessment?: No apparent visual deficits   Perception     Praxis      Cognition Arousal/Alertness: Awake/alert Behavior During Therapy: WFL for tasks assessed/performed Overall Cognitive Status: Within Functional Limits for tasks assessed                                          Exercises     Shoulder Instructions       General Comments HR 70s with activity and >90% O2 on RA    Pertinent Vitals/ Pain       Pain Assessment: No/denies pain  Home Living                                          Prior Functioning/Environment              Frequency  Min 2X/week  Progress Toward Goals  OT Goals(current goals can now be found in the care plan section)  Progress towards OT goals: Progressing toward goals  Acute Rehab OT Goals Patient Stated Goal: not be on oxygen OT Goal Formulation: With patient Time For Goal Achievement: 12/15/18 Potential to Achieve Goals: Good ADL Goals Pt Will Perform Grooming: standing;with modified independence Pt Will Transfer to Toilet: with modified independence;ambulating Pt Will Perform Tub/Shower Transfer: Shower transfer;shower seat;ambulating;with modified independence Additional ADL Goal #1: Pt will verbalize 3 energy conservation techniques to incorporate into daily routine with independence.  Plan Discharge plan remains appropriate    Co-evaluation                 AM-PAC OT "6 Clicks" Daily Activity     Outcome Measure   Help from another person eating meals?: None Help from another person taking care of personal grooming?: A Little Help from  another person toileting, which includes using toliet, bedpan, or urinal?: A Little Help from another person bathing (including washing, rinsing, drying)?: A Little Help from another person to put on and taking off regular upper body clothing?: None Help from another person to put on and taking off regular lower body clothing?: A Little 6 Click Score: 20    End of Session Equipment Utilized During Treatment: Oxygen  OT Visit Diagnosis: Other abnormalities of gait and mobility (R26.89);Muscle weakness (generalized) (M62.81)   Activity Tolerance Patient tolerated treatment well   Patient Left in chair;with chair alarm set;with call bell/phone within reach   Nurse Communication Mobility status        Time: 3254-9826 OT Time Calculation (min): 35 min  Charges: OT General Charges $OT Visit: 1 Visit OT Treatments $Self Care/Home Management : 23-37 mins  Ebony Hail Harold Hedge) Marsa Aris OTR/L Acute Rehabilitation Services Pager: [phone] Office: [phone]    Fredda Hammed 12/02/2018, 3:50 PM

## 2018-12-03 ENCOUNTER — Inpatient Hospital Stay (HOSPITAL_COMMUNITY): Payer: PPO

## 2018-12-03 DIAGNOSIS — I361 Nonrheumatic tricuspid (valve) insufficiency: Secondary | ICD-10-CM

## 2018-12-03 DIAGNOSIS — I159 Secondary hypertension, unspecified: Secondary | ICD-10-CM

## 2018-12-03 DIAGNOSIS — I371 Nonrheumatic pulmonary valve insufficiency: Secondary | ICD-10-CM

## 2018-12-03 DIAGNOSIS — I701 Atherosclerosis of renal artery: Secondary | ICD-10-CM

## 2018-12-03 HISTORY — DX: Atherosclerosis of renal artery: I70.1

## 2018-12-03 LAB — BASIC METABOLIC PANEL
Anion gap: 11 (ref 5–15)
BUN: 43 mg/dL — ABNORMAL HIGH (ref 8–23)
CO2: 26 mmol/L (ref 22–32)
Calcium: 8.6 mg/dL — ABNORMAL LOW (ref 8.9–10.3)
Chloride: 98 mmol/L (ref 98–111)
Creatinine, Ser: 2.9 mg/dL — ABNORMAL HIGH (ref 0.44–1.00)
GFR calc Af Amer: 17 mL/min — ABNORMAL LOW (ref >60)
GFR calc non Af Amer: 15 mL/min — ABNORMAL LOW (ref >60)
Glucose, Bld: 213 mg/dL — ABNORMAL HIGH (ref 70–99)
Potassium: 3.7 mmol/L (ref 3.5–5.1)
Sodium: 135 mmol/L (ref 135–145)

## 2018-12-03 LAB — GLUCOSE, CAPILLARY
Glucose-Capillary: 92 mg/dL (ref 70–99)
Glucose-Capillary: 151 mg/dL — ABNORMAL HIGH (ref 70–99)
Glucose-Capillary: 103 mg/dL — ABNORMAL HIGH (ref 70–99)

## 2018-12-03 LAB — ECHOCARDIOGRAM LIMITED
Height: 68 in
Weight: 2636.8 oz

## 2018-12-03 MED ORDER — FUROSEMIDE 40 MG PO TABS: 40.0000 mg | ORAL_TABLET | Freq: Two times a day (BID) | ORAL | 0 refills | Status: DC

## 2018-12-03 MED ORDER — POTASSIUM CHLORIDE CRYS ER 20 MEQ PO TBCR
40.0000 meq | EXTENDED_RELEASE_TABLET | ORAL | Status: AC
  Administered 2018-12-03: 13:00:00 40 meq via ORAL
  Filled 2018-12-03: qty 2

## 2018-12-03 MED ORDER — ISOSORBIDE MONONITRATE ER 120 MG PO TB24: 120.0000 mg | ORAL_TABLET | Freq: Every day | ORAL | 0 refills | Status: DC

## 2018-12-03 MED ORDER — CLONIDINE HCL 0.1 MG PO TABS: 0.1000 mg | ORAL_TABLET | Freq: Three times a day (TID) | ORAL | 11 refills | Status: DC

## 2018-12-03 MED ORDER — NIFEDIPINE ER 90 MG PO TB24: 90.0000 mg | ORAL_TABLET | Freq: Every day | ORAL | 0 refills | Status: DC

## 2018-12-03 MED ORDER — HYDROCODONE-ACETAMINOPHEN 5-325 MG PO TABS: 1.0000 | ORAL_TABLET | ORAL | 0 refills | Status: DC | PRN

## 2018-12-03 MED ORDER — HYDRALAZINE HCL 100 MG PO TABS: 100.0000 mg | ORAL_TABLET | Freq: Three times a day (TID) | ORAL | 0 refills | Status: DC

## 2018-12-03 MED ORDER — CARVEDILOL 25 MG PO TABS: 25.0000 mg | ORAL_TABLET | Freq: Two times a day (BID) | ORAL | 0 refills | Status: DC

## 2018-12-03 MED ORDER — FUROSEMIDE 40 MG PO TABS
40.0000 mg | ORAL_TABLET | Freq: Two times a day (BID) | ORAL | Status: DC
  Administered 2018-12-03 (×2): 40 mg via ORAL
  Filled 2018-12-03 (×2): qty 1

## 2018-12-03 MED ORDER — SODIUM CHLORIDE 0.9 % IV SOLN
510.0000 mg | Freq: Once | INTRAVENOUS | Status: AC
  Administered 2018-12-03: 510 mg via INTRAVENOUS
  Filled 2018-12-03: qty 17

## 2018-12-03 MED ORDER — NOVOLOG FLEXPEN 100 UNIT/ML ~~LOC~~ SOPN: 2.0000 [IU] | PEN_INJECTOR | Freq: Every day | SUBCUTANEOUS | 0 refills | Status: DC

## 2018-12-03 MED ORDER — INSULIN GLARGINE 100 UNIT/ML SOLOSTAR PEN: 13.0000 [IU] | PEN_INJECTOR | Freq: Two times a day (BID) | SUBCUTANEOUS | 0 refills | Status: DC

## 2018-12-03 MED ORDER — LOPERAMIDE HCL 2 MG PO CAPS
4.0000 mg | ORAL_CAPSULE | Freq: Once | ORAL | Status: AC
  Administered 2018-12-03: 17:00:00 4 mg via ORAL
  Filled 2018-12-03: qty 2

## 2018-12-03 MED FILL — ISOSORBIDE MN ER 60 MG TAB: 60 | 30 days supply | Qty: 60 | Fill #0

## 2018-12-03 MED FILL — NIFEdipine ER 90 MG TB24: 90 | 30 days supply | Qty: 30 | Fill #0

## 2018-12-03 MED FILL — CARVEDILOL 25 MG TABLET: 25 | 30 days supply | Qty: 60 | Fill #0

## 2018-12-03 MED FILL — cloNIDine HCL 0.1 MG TABS: 0.1 | 30 days supply | Qty: 90 | Fill #0 | Status: TO

## 2018-12-03 MED FILL — FUROSEMIDE 40 MG TABLET: 40 | 30 days supply | Qty: 60 | Fill #0

## 2018-12-03 MED FILL — hydrALAZINE HCL 100 MG TABS: 100 | 30 days supply | Qty: 90 | Fill #0

## 2018-12-03 NOTE — Discharge Summary (Signed)
Leah Walsh, is a 78 y.o. female  DOB 22-Apr-1941  MRN [id number].  Admission date:  11/24/2018  Admitting Physician  [name], MD  Discharge Date:  12/03/2018   Primary MD  [name], MD  Recommendations for primary care physician for things to follow:  -Monitoring of blood counts and kidney function -Consider need of repeat chest x-ray in 4 weeks to verify resolution of pneumonia -Patient may need referral for closer cardiologist  Discharge Diagnosis   Principal Problem:   CAP (community acquired pneumonia) Active Problems:   Obstructive sleep apnea   Microcytic anemia   Asthma, mild persistent   Secondary hypertension   Coronary artery disease involving native coronary artery of native heart without angina pectoris   Insulin-requiring or dependent type II diabetes mellitus ([place])   Acute renal failure superimposed on stage 4 chronic kidney disease ([place])   Hypertensive urgency   Hyponatremia   Nausea vomiting and diarrhea   Elevated troponin   Acute Respiratory failure with hypoxia ([place])   Renal artery stenosis ([place])      Past Medical History:  Diagnosis Date   Asthma    Diabetes ([place])    Heart attack ([place]) 2014   Hyperlipidemia    Hypertension    Kidney disease    Sleep apnea    Using CPAP    Past Surgical History:  Procedure Laterality Date   ABDOMINAL HYSTERECTOMY     CAROTID STENT     CHOLECYSTECTOMY     TUBAL LIGATION     TUMOR REMOVAL  12/2016   Tumor on ovary   VESICOVAGINAL FISTULA CLOSURE W/ TAH         HPI  from the history and physical done on the day of admission:    Leah Walsh is a 78 y.o. female with medical history significant for asthma, chronic kidney disease stage IV, hypertension, hyperlipidemia, insulin-dependent diabetes mellitus, and OSA, now  presenting to the emergency department for evaluation of fevers, nausea, vomiting, diarrhea, and shortness of breath.  Patient reports that she developed the aforementioned symptoms just over 1 week ago and was evaluated by her PCP with influenza and COVID-19 testing on 11/18/2018.  She reports that the influenza PCR was negative and she was told to expect 14 days for COVID results.  Since that time, she has had ongoing subjective fevers, chills, and shortness of breath.  She had been experiencing nausea with vomiting and diarrhea, but reports that she has not had any of this in the past 24 hours.  There was no abdominal pain or bleeding.  She denies any chest pain or palpitations.  She reports occasional leg swelling, but no leg swelling or tenderness now.  She denies any recent travel or sick contacts.  [facility] ED Course: Upon arrival to the ED, patient is found to be afebrile, saturating 90% on room air, breathing 22 times per minute, slightly hypertensive, and with normal heart rate.  Chest x-ray is concerning for new right basilar pneumonia.  CBC features  a sodium of 132, BUN 69, and creatinine of 3.30, up from 1.8 in December.  CBC is notable for leukocytosis to 11,800 and a stable microcytic anemia with hemoglobin of 8.6.  Absolute lymphocyte count is low at 354.  Lactic acid is reassuringly normal.  Troponin is slightly elevated 0.08 and proBNP is elevated to 6620.  Procalcitonin is elevated to 17.8, LDH is normal, and CRP elevated.  Patient was treated with 500 cc normal saline, acetaminophen she was placed on 2 L/min of supplemental oxygen.  Transfer to [facility] was arranged for further evaluation and management.   Hospital Course:   1.  CAP/RLL PNA--- Covid-19 ruled outalready Fevers have resolved, dyspnea and hypoxia improving with diuresis --patient remains not very compliant withBiPAP use--- -She was tested for COVID-19 by her PCP on 3/25/20and results are Negative  faxed in from PCPs so we Discontinued all COVID related isolations. PTA patient was not on home O2--- oxygen requirement improving with treatment of pneumonia and IV diuresis---few days ago patient was requiring 10 L of oxygen via nasal cannula she is down to RA as of 12/02/2018.--hypoxic respiratory failure wasinitially secondary to PNA , developed volume overloaded/CHF pattern--with IV diuretics clinically and radiologically volume overload appears to have improved ---dyspnea and hypoxia improving fluid input and output monitoring, daily weights, continue BiPAP as needed during the day and nightly (at baseline patient has OSA and needs CPAP anyway).  blood cultures x2: Negative to date. Procalcitonin trended down from 14 to 2.1.Urine pneumococcal antigen negative.WBC is 10.4 -Treated with IV ceftriaxone and azithromycin from 11/23/18 (started at [facility]) thru 11/27/18,completedAugmentin coursed due to enterococcus in the urine and also to cover respiratory bugs -Would benefit follow-up chest x-ray in 4 weeks to ensure resolution of pneumonia findings.  2.Acute kidney injury Superimposed on stage IV CKD -SCr is 3.30 on admission, up from 1.81 in December2019. -Initially thought to be prerenal azotemia in setting of recent N/V/D; fortunately, vomiting and diarrhea appears to have resolved. Follows outpatient with Dr. Olivia Mackie of nephrology in [facility], monitored renal function closely with IV diuresis. Creatinine trending down, currently 2.9.  Discussed case with Dr. Olivia Mackie on 4/9 who will schedule outpatient labs and arrange for telephone follow-up next week.  3.  Volume Overload/Acute Diastolic CHF: Overall improving as noted above #1,-- developed volume overloaded/CHF pattern--please see #1 above,BNP is 427.5. Patient was placed on IV Lasix of 40 mg twice daily.  Chest x-ray on 12/02/2018 showing improving aeration.  Patient notes she had been changed from Lasix 40 mg twice daily in  November/December 2019, and had been placed on hydrochlorothiazide 25 mg daily to achieve better blood pressure control.  However, since that time she has declined.  Discussed patient's case with a partner of Dr. Atilano Median.  Notes last readily visible echocardiogram was noted to be normal from 8 years ago, and cannot tell me regarding previous studies done in 2017.  Repeat echocardiogram performed on 12/03/2018 revealing EF of 60 to 65% with no significant signs of diastolic dysfunction.  Patient with improvement of lower extremity edema and able to maintain O2 sats on room air.  Changed patient back to Lasix 40 mg twice daily.  Will need to follow-up in outpatient setting regarding need of potassium replacement. Weight was up to 81.5 kg on 4/4, but is  down to 74.8kgat discharge.  4.  Diabetes mellitus type 2-- oral intake is not great.  Reports having low blood sugars in the 40s at home.  Patient noted to  have low blood sugars in the hospital for which Lantus insulin at reduced dose of 13 units twice daily from reported 40 units.  Patient only requiring 2 units of NovoLog before lunch.  Patient recommended to continue reduced dose at discharge and follow-up with her primary care provider for further need of adjustments.  5.Elevated troponin -Troponin was 0.08 (nl <0.04) in the [facility] ED..... Troponin elevation not consistent with ACS pattern, unable to do echocardiogram to rule out regional wall motion abnormalities or evaluate EF Due to COVID related situation -There are no anginal complaints on admission and she denies any recent chest pain -Likely related to acute on chronic kidney disease and acute illness.  6.  Secondary hypertension, renal artery stenosis ---BP improving, c/n clonidine 0.1 mg 3 times daily, c/n isosorbide to 120 mg daily, continue Coreg at increased dose of 12.5 mg to25 mg p.o. twice daily, continue hydralazine 100 mg 3 times daily, changed to 60 mg orally twice daily to  Procardia XL 90 mg daily, and changed back to furosemide 40 mg twice daily.  Losartan and hydrochlorothiazide were discontinued.  Patient with known history of left atrophic kidney and renal artery stenosis of the right kidney not amendable to intervention.  Blood pressure seen to be better controlled on this regimen 136/57-153/44.  7) Outpatient blood culture positive for gram-positive rod and anaerobic bottle (? Contaminant): Blood culture sent from [facility] on 11/24/18, negative to date. -- patient was treated with IV Rocephin and IV azithromycin starting 11/23/2018 at [facility], Rocephin and azithromycin was continued from 3/31/202o to 11/27/2018 here at [facility], switched to p.o. Augmentin on 11/27/2018 (for 5 days) to allow for coverage of enterococcus in the urine as well  8.  Acute on chronic Anemia,at baseline patient has chronic anemia of CKD----baseline hemoglobin hemoglobin 9.6 in December 2019, hemoglobin is up to 8.9 from 6.9, after transfusion of 1 unit of PRBC on 11/28/2018.No overt bleeding observed.  Patient with low MCV and MCH.  Received 1 infusion of Feraheme.  Patient may benefit from EPO/Procrit given anemia of CKD.  Follow-up scheduled with the patient's nephrologist.  9. OSA---- Patient was using CPAP---at Home every Bedtime due to OSA (Obstructive Sleep Apnea)...Marland KitchenUse BiPAP nightly and PRN during the day--patient isnot verycompliant with BiPAP  10.  Urine Culture with Enterococcus-okay to complete Augmentin (started 11/27/18) for 5 days completed  11)Acute Hypoxic Respiratory failure--- initially secondary to PNA , developed volume overloaded/CHF pattern--with IV diuretics clinically and radiologically volume overload appears to have improved --- please see #1 and #3 above...  12.  Generalized weakness/debility--- patient having difficulties with mobility related activities of daily living due to hypoxia...PT and OT eval appreciated,PT recommends outpatient PT post  discharge  13.  Depression--- stable, continue Zoloft at 25 mg daily    Follow UP  Follow-up Information    Dr.Lynley Helene Kelp. Go on 12/11/2018.   Why:  Post hospital follow up scheduled for 12/11/2018 at 11 am Contact information: [facility] Physician Dr. Kennith Maes [address] [place], [place] [address] [phone]       Thereasa Distance, MD Follow up.   Specialty:  Nephrology Why:  Their office should call you with a follow-up telephone appointment for sometime next week.  If not notified of this appointment please call their office. Contact information: [address] [place] [address] [phone]            Consults obtained: Discussed case over the phone  with patient's nephrologist Lufadeju, discussed case with partner of Dr. Atilano Median over the phone  Discharge Condition: Stable  Diet and Activity recommendation: See Discharge Instructions below   Discharge Instructions    Discharge instructions   Complete by:  As directed    Follow with Primary Lynley S. Holts, MD at scheduled appointment as listed below.  During his hospitalization moderate changes were made to your medication regimen.  Please be mindful of these changes and organize medications to take accordingly.  Follow-up with your primary doctor if needing new referral to a cardiologist closer to you.  Please check blood sugars before meals and at bedtime and keep a log to show your primary care provider encase further changes need to be made to your insulin regimen to avoid low blood sugars.  It is also recommended that you check daily weights and notify your primary physician if your weight increases more than 2 to 3 pounds in a week.  CBC and BMP-check within the next week to be arranged by your nephrologist ( we routinely change or add medications that can affect your baseline labs and fluid status, therefore we recommend that you get the mentioned basic workup next visit with your  PCP, your PCP may decide not to get them or add new tests based on their clinical decision)  Activity: As tolerated with fall precautions  Disposition: Home   Referrals: Home health physical therapy, and RN for medication management  Diet: Heart Healthy and carbohydrate modified diet    For Heart failure patients - Check your Weight same time everyday, if you gain over 2 pounds, or you develop in leg swelling, experience more shortness of breath or chest pain, call your Primary doctor immediately. Follow Cardiac Low Salt Diet and 1.5 lit/day fluid restriction.  Special Instructions: If you have smoked or chewed Tobacco  in the last 2 yrs please stop smoking, stop any regular Alcohol  and or any Recreational drug use.  On your next visit with your primary care physician please Get Medicines reviewed and adjusted.  Please request your [name], MD to go over all Hospital Tests and Procedure/Radiological results at the follow up, please get all Hospital records sent to your Prim MD by signing hospital release before you go home.  If you experience worsening of your admission symptoms, develop shortness of breath, life threatening emergency, suicidal or homicidal thoughts you must seek medical attention immediately by calling 911 or calling your MD immediately  if symptoms less severe.  You Must read complete instructions/literature along with all the possible adverse reactions/side effects for all the Medicines you take and that have been prescribed to you. Take any new Medicines after you have completely understood and accpet all the possible adverse reactions/side effects.   Do not drive, operate heavy machinery, perform activities at heights, swimming or participation in water activities or provide baby sitting services if your were admitted for syncope or siezures until you have seen by Primary MD or a Neurologist and advised to do so again.  Do not drive when taking Pain medications.   Do not take more than prescribed Pain, Sleep and Anxiety Medications  Wear Seat belts while driving.   Please note  You were cared for by a hospitalist during your hospital stay. If you have any questions about your discharge medications or the care you received while you were in the hospital after you are discharged, you can call the unit and asked to speak with the hospitalist  on call if the hospitalist that took care of you is not available. Once you are discharged, your primary care physician will handle any further medical issues. Please note that NO REFILLS for any discharge medications will be authorized once you are discharged, as it is imperative that you return to your primary care physician (or establish a relationship with a primary care physician if you do not have one) for your aftercare needs so that they can reassess your need for medications and monitor your lab values.        Discharge Medications     Allergies as of 12/03/2018      Reactions   Compazine [prochlorperazine Edisylate] Other (See Comments)   Stroke like symptoms   Colcrys [colchicine] Diarrhea   Hydrocodone    Codeine Nausea And Vomiting      Medication List    STOP taking these medications   fluticasone 110 MCG/ACT inhaler Commonly known as:  Flovent HFA   hydrochlorothiazide 25 MG tablet Commonly known as:  HYDRODIURIL   losartan 50 MG tablet Commonly known as:  COZAAR     TAKE these medications   acetaminophen 500 MG tablet Commonly known as:  TYLENOL Take 1,000 mg by mouth every 8 (eight) hours as needed (pain / fever).   albuterol 108 (90 Base) MCG/ACT inhaler Commonly known as:  Proventil HFA Inhale two puffs every four to six hours as needed for cough or wheeze What changed:    how much to take  how to take this  when to take this  reasons to take this   allopurinol 100 MG tablet Commonly known as:  ZYLOPRIM Take 100 mg by mouth daily.   aspirin 81 MG tablet Take 81 mg  by mouth at bedtime.   atorvastatin 80 MG tablet Commonly known as:  LIPITOR Take 80 mg by mouth at bedtime.   B-D UF III MINI PEN NEEDLES 31G X 5 MM Misc Generic drug:  Insulin Pen Needle   beclomethasone 80 MCG/ACT inhaler Commonly known as:  QVAR Inhale 2 puffs into the lungs 2 (two) times daily.   carvedilol 25 MG tablet Commonly known as:  COREG Take 1 tablet (25 mg total) by mouth 2 (two) times daily with a meal. What changed:    medication strength  how much to take   cloNIDine 0.1 MG tablet Commonly known as:  CATAPRES Take 1 tablet (0.1 mg total) by mouth 3 (three) times daily.   CoQ10 200 MG Caps Take 1 capsule by mouth daily.   cycloSPORINE 0.05 % ophthalmic emulsion Commonly known as:  RESTASIS Place 1 drop into both eyes 2 (two) times daily.   ezetimibe 10 MG tablet Commonly known as:  ZETIA Take 10 mg by mouth daily.   FISH OIL PO Take 1 capsule by mouth daily.   furosemide 40 MG tablet Commonly known as:  LASIX Take 1 tablet (40 mg total) by mouth 2 (two) times daily.   hydrALAZINE 100 MG tablet Commonly known as:  APRESOLINE Take 1 tablet (100 mg total) by mouth 3 (three) times daily for 30 days. What changed:  when to take this   HYDROcodone-acetaminophen 5-325 MG tablet Commonly known as:  NORCO/VICODIN Take 1-2 tablets by mouth every 4 (four) hours as needed for moderate pain.   Insulin Glargine 100 UNIT/ML Solostar Pen Commonly known as:  Lantus SoloStar Inject 13 Units into the skin 2 (two) times daily. What changed:    how much to take  when to  take this  additional instructions   isosorbide mononitrate 120 MG 24 hr tablet Commonly known as:  IMDUR Take 1 tablet (120 mg total) by mouth daily for 30 days. Start taking on:  December 04, 2018   montelukast 10 MG tablet Commonly known as:  SINGULAIR Take 1 tablet (10 mg total) by mouth daily. What changed:  when to take this   multivitamin tablet Take 1 tablet by mouth daily.    NIFEdipine 90 MG 24 hr tablet Commonly known as:  ADALAT CC Take 1 tablet (90 mg total) by mouth daily. What changed:    medication strength  how much to take  when to take this   Nitrostat 0.4 MG SL tablet Generic drug:  nitroGLYCERIN Place 0.4 mg under the tongue every 5 (five) minutes as needed for chest pain.   NovoLOG FlexPen 100 UNIT/ML FlexPen Generic drug:  insulin aspart Inject 2 Units into the skin daily before lunch. What changed:    how much to take  when to take this   omeprazole 20 MG capsule Commonly known as:  PRILOSEC Take 20 mg by mouth daily.   PRESERVISION AREDS 2 PO Take 1 capsule by mouth 2 (two) times daily.   sertraline 25 MG tablet Commonly known as:  ZOLOFT Take 25 mg by mouth daily.   Vitamin D 50 MCG (2000 UT) Caps Take 1 capsule by mouth daily.       Major procedures and Radiology Reports - PLEASE review detailed and final reports for all details, in brief -   Limited echo from 12/03/2018: IMPRESSIONS  1. The left ventricle has normal systolic function, with an ejection fraction of 60-65%. The cavity size was normal. There is moderately increased left ventricular wall thickness. Left ventricular diastolic parameters were normal.  2. The right ventricle has normal systolc function. The cavity was normal. There is no increase in right ventricular wall thickness.  3. Mild thickening of the mitral valve leaflet. There is mild mitral annular calcification present.  4. The tricuspid valve was grossly normal.  5. The aortic valve is tricuspid Moderate thickening of the aortic valve Sclerosis without any evidence of stenosis of the aortic valve.  6. The aortic root is normal in size and structure.   Dg Chest 2 View  Result Date: 12/02/2018 CLINICAL DATA:  78 year old female with shortness of breath EXAM: CHEST - 2 VIEW COMPARISON:  11/30/2018, 11/28/2018, 11/27/2018 FINDINGS: Cardiomediastinal silhouette unchanged in size and contour. No  pneumothorax. Opacity at the right lung base persists with obscuration the right hemidiaphragm. Blunting of left costophrenic angle. Improving aeration in the right mid lung with no new confluent airspace disease. IMPRESSION: Improving aeration of the right upper and mid lung with persisting opacity at the right lung base, potentially combination of pleural fluid and consolidation/atelectasis. Blunting at the left costophrenic angle likely represents small pleural fluid and atelectasis Electronically Signed   By: Corrie Mckusick D.O.   On: 12/02/2018 08:18   Dg Chest 2 View  Result Date: 11/30/2018 CLINICAL DATA:  Shortness of breath EXAM: CHEST - 2 VIEW COMPARISON:  11/28/2018 FINDINGS: Interstitial and alveolar airspace opacities in the right lung. Bilateral lower lobe airspace disease. Small right pleural effusion. Right basilar airspace disease. No pneumothorax. Stable cardiomediastinal silhouette. No aggressive osseous lesion. IMPRESSION: Bilateral lower lobe airspace disease which may reflect atelectasis versus pneumonia. Interstitial thickening more severe in the right lung with a small right pleural effusion. This may reflect interstitial edema versus infectious etiology. Electronically Signed  By: Kathreen Devoid   On: 11/30/2018 08:04   Dg Chest 2 View  Result Date: 11/28/2018 CLINICAL DATA:  Short of breath. Follow-up exam. EXAM: CHEST - 2 VIEW COMPARISON:  11/27/2018 and 11/23/2018 FINDINGS: Allowing for differences in technique and patient positioning, there has been no significant interval change from the most recent prior exam. Right larger than left pleural effusions, bilateral interstitial lung opacities and right-sided, centrally predominant and lower lung zone airspace opacities are stable. No new lung abnormalities. No pneumothorax. IMPRESSION: 1. No significant interval change from the most recent prior study. 2. Persistent pleural effusions, bilateral interstitial lung opacities and  right-sided airspace opacities. Electronically Signed   By: Lajean Manes M.D.   On: 11/28/2018 07:27   Dg Chest Port 1 View  Result Date: 11/27/2018 CLINICAL DATA:  Increasing oxygen demand EXAM: PORTABLE CHEST 1 VIEW COMPARISON:  11/23/2018 FINDINGS: Diffuse interstitial opacity with Kerley lines and pleural effusions. There is alveolar opacity asymmetric to the right. Borderline heart size. IMPRESSION: New interstitial and airspace opacity with pleural effusions, a CHF pattern. Pneumonia was suspected on preceding radiograph and would be obscured. Electronically Signed   By: Monte Fantasia M.D.   On: 11/27/2018 04:21    Micro Results     Recent Results (from the past 240 hour(s))  Culture, blood (routine x 2)     Status: None   Collection Time: 11/24/18  6:23 PM  Result Value Ref Range Status   Specimen Description BLOOD LEFT ANTECUBITAL  Final   Special Requests   Final    BOTTLES DRAWN AEROBIC AND ANAEROBIC Blood Culture adequate volume   Culture   Final    NO GROWTH 5 DAYS Performed at [facility], [address], [place], [place] [address]    Report Status 11/29/2018 FINAL  Final  Culture, blood (routine x 2)     Status: None   Collection Time: 11/24/18  6:49 PM  Result Value Ref Range Status   Specimen Description BLOOD LEFT HAND  Final   Special Requests   Final    BOTTLES DRAWN AEROBIC AND ANAEROBIC Blood Culture results may not be optimal due to an excessive volume of blood received in culture bottles   Culture   Final    NO GROWTH 5 DAYS Performed at [facility], [place] [address], [place], [place] [address]    Report Status 11/29/2018 FINAL  Final  Culture, Urine     Status: Abnormal   Collection Time: 11/24/18 11:28 PM  Result Value Ref Range Status   Specimen Description URINE, RANDOM  Final   Special Requests   Final    NONE Performed at [facility], [place] [address], [place], [place] [address]    Culture 20,000 COLONIES/mL ENTEROCOCCUS  FAECALIS (A)  Final   Report Status 11/27/2018 FINAL  Final   Organism ID, Bacteria ENTEROCOCCUS FAECALIS (A)  Final      Susceptibility   Enterococcus faecalis - MIC*    AMPICILLIN <=2 SENSITIVE Sensitive     LEVOFLOXACIN 1 SENSITIVE Sensitive     NITROFURANTOIN <=16 SENSITIVE Sensitive     VANCOMYCIN 1 SENSITIVE Sensitive     * 20,000 COLONIES/mL ENTEROCOCCUS FAECALIS       Today   Subjective    [name] today is that she is feeling much better and is no longer on oxygen.  Patient notes that she only has 1 kidney and started going downhill after her cardiologist switched her from  furosemide 40 mg twice daily to hydrochlorothiazide for better control of blood pressure sometime in November/December 2019.  Since she has been receiving these injections for her eyes for the last 5 months she had balding of the posterior aspect of her scalp.   Objective   Blood pressure (!) 149/44, pulse 66, temperature 97.9 F (36.6 C), temperature source Oral, resp. rate 18, height 5\' 8"  (1.727 m), weight 74.8 kg, SpO2 97 %.   Intake/Output Summary (Last 24 hours) at 12/03/2018 1341 Last data filed at 12/03/2018 0630 Gross per 24 hour  Intake --  Output 1850 ml  Net -1850 ml    Exam  Constitutional: Elderly female in NAD, calm, comfortable Eyes: PERRL, lids and conjunctivae normal ENMT: Mucous membranes are moist. Posterior pharynx clear of any exudate or lesions.  Neck: normal, supple, no masses, no thyromegaly.  No JVD Respiratory: clear to auscultation bilaterally, no wheezing, no crackles. Normal respiratory effort. No accessory muscle use.  Cardiovascular: Regular rate and rhythm, no murmurs / rubs / gallops.  Trace lower extremity edema on the left lower extremity, and no lower extremity edema right. 2+ pedal pulses. No carotid bruits.  Abdomen: no tenderness, no masses palpated. No hepatosplenomegaly. Bowel sounds positive.  Musculoskeletal: no clubbing / cyanosis. No joint  deformity upper and lower extremities. Good ROM, no contractures. Normal muscle tone.  Skin: Alopecia noted at the posterior scalp Neurologic: CN 2-12 grossly intact. Sensation intact, DTR normal. Strength 5/5 in all 4.  Psychiatric: Normal judgment and insight. Alert and oriented x 3.  Anxious mood.    Data Review   CBC w Diff:  Lab Results  Component Value Date   WBC 9.9 12/02/2018   HGB 8.3 (L) 12/02/2018   HCT 25.3 (L) 12/02/2018   PLT 272 12/02/2018   LYMPHOPCT 10 11/25/2018   MONOPCT 10 11/25/2018   EOSPCT 1 11/25/2018   BASOPCT 0 11/25/2018    CMP:  Lab Results  Component Value Date   NA 135 12/03/2018   K 3.7 12/03/2018   CL 98 12/03/2018   CO2 26 12/03/2018   BUN 43 (H) 12/03/2018   CREATININE 2.90 (H) 12/03/2018   PROT 5.7 (L) 11/29/2018   ALBUMIN 2.5 (L) 11/29/2018   BILITOT 0.6 11/29/2018   ALKPHOS 73 11/29/2018   AST 18 11/29/2018   ALT 21 11/29/2018  .   Total Time in preparing paper work, data evaluation and todays exam - 35 minutes  Norval Morton M.D on 12/03/2018 at [place]  [phone]

## 2018-12-03 NOTE — Progress Notes (Signed)
Pt/family given discharge instructions, medication lists, follow up appointments, and when to call the doctor.  Pt/family verbalizes understanding. All information given over phone with daughter Santiago Glad and pt. All questions answered. Daughter at main entrance with clothing for discharge. PM NT aware that pt needs to have belongings packed, get dressed and meet daughter at main entrance. Pt resting with call bell within reach. Payton Emerald, RN

## 2018-12-03 NOTE — TOC Transition Note (Signed)
Transition of Care [facility]) - CM/SW Discharge Note   Patient Details  Name: Leah Walsh MRN: [id number] Date of Birth: 03-06-1941  Transition of Care [facility] CM/SW Contact:  Sharin Mons, RN Phone Number: 12/03/2018, 3:18 PM   Clinical Narrative:     Transition to home today. Choice list provided to pt for home health services. [facility] selected by pt. NCM made referral to [facility] for home health PT services.  Pt states has transportation to home.  Final next level of care: [place] Barriers to Discharge: Barriers Resolved   Patient Goals and CMS Choice Patient states their goals for this hospitalization and ongoing recovery are:: get better   Choice offered to / list presented to : NA  Discharge Placement                       Discharge Plan and Services   Discharge Planning Services: CM Consult Post Acute Care Choice: NA          DME Arranged: N/A DME Agency: NA HH Arranged: PT HH Agency: [place]   Social Determinants of Health (SDOH) Interventions     Readmission Risk Interventions No flowsheet data found.

## 2018-12-03 NOTE — Progress Notes (Signed)
  Echocardiogram 2D Echocardiogram has been performed.  Leah Walsh 12/03/2018, 10:45 AM

## 2018-12-03 NOTE — Progress Notes (Signed)
Pt concerned about traveling home and feeling urgency to go to bathroom. MD ordered imodium and pt family to wait until after dinner when urgency from lasix dose has passed. Daughter and pt are in agreement this would be good idea. Pt resting with call bell within reach.  Will continue to monitor. Payton Emerald, RN

## 2018-12-03 NOTE — Progress Notes (Signed)
Physical Therapy Treatment Patient Details Name: Leah Walsh MRN: [id number] DOB: Nov 09, 1940 Today's Date: 12/03/2018    History of Present Illness 78 yo F with history of sleep apnea, kidney disease, HTN, hyperlipidemia, heart attack, DM, asthma, ovarian tumor removal, carotid stent. Fell after vomiting at home and diagnosed with pneumonia.     PT Comments    Pt performed gait training and stair training to prepare for return home.  Pt is progressing well and HHPT remains appropriate.    Follow Up Recommendations  Home health PT     Equipment Recommendations  None recommended by PT    Recommendations for Other Services       Precautions / Restrictions Precautions Precautions: Fall Precaution Comments: Fall risk improving Restrictions Weight Bearing Restrictions: No    Mobility  Bed Mobility Overal bed mobility: Independent                Transfers Overall transfer level: Needs assistance Equipment used: Rolling walker (2 wheeled) Transfers: Sit to/from Stand Sit to Stand: Min assist;Supervision         General transfer comment: Supervision with cues for hand placement  Ambulation/Gait Ambulation/Gait assistance: Supervision Gait Distance (Feet): 120 Feet Assistive device: Rolling walker (2 wheeled) Gait Pattern/deviations: Step-through pattern;Trunk flexed;Shuffle Gait velocity: decreased   General Gait Details: Cues for upper trunk control and increasing stride length   Stairs Stairs: Yes Stairs assistance: Min guard Stair Management: Two rails Number of Stairs: 6 General stair comments: Cues for sequencing and safety.  Increased DOE.     Wheelchair Mobility    Modified Rankin (Stroke Patients Only)       Balance Overall balance assessment: Needs assistance Sitting-balance support: No upper extremity supported Sitting balance-Leahy Scale: Fair       Standing balance-Leahy Scale: Poor                               Cognition Arousal/Alertness: Awake/alert Behavior During Therapy: WFL for tasks assessed/performed Overall Cognitive Status: Within Functional Limits for tasks assessed                                 General Comments: pt repeated self several times during session and asked me if I knew she was going home tomorrow after she had told me herself twice. Questions is this baseline or new?      Exercises      General Comments        Pertinent Vitals/Pain Pain Assessment: No/denies pain Faces Pain Scale: Hurts little more Pain Location: buttocks Pain Descriptors / Indicators: Discomfort Pain Intervention(s): Monitored during session;Repositioned    Home Living                      Prior Function            PT Goals (current goals can now be found in the care plan section) Acute Rehab PT Goals Patient Stated Goal: to get back to bed Potential to Achieve Goals: Good Progress towards PT goals: Progressing toward goals    Frequency    Min 3X/week      PT Plan Current plan remains appropriate    Co-evaluation              AM-PAC PT "6 Clicks" Mobility   Outcome Measure  Help needed turning from your back to your side while  in a flat bed without using bedrails?: None Help needed moving from lying on your back to sitting on the side of a flat bed without using bedrails?: None Help needed moving to and from a bed to a chair (including a wheelchair)?: A Little Help needed standing up from a chair using your arms (e.g., wheelchair or bedside chair)?: A Little Help needed to walk in hospital room?: A Little Help needed climbing 3-5 steps with a railing? : A Little 6 Click Score: 20    End of Session Equipment Utilized During Treatment: Gait belt Activity Tolerance: Patient tolerated treatment well   Nurse Communication: Mobility status PT Visit Diagnosis: Unsteadiness on feet (R26.81);Other abnormalities of gait and mobility (R26.89);Muscle  weakness (generalized) (M62.81);Difficulty in walking, not elsewhere classified (R26.2)     Time: 2527-1292 PT Time Calculation (min) (ACUTE ONLY): 16 min  Charges:  $Gait Training: 8-22 mins                     Governor Rooks, PTA Acute Rehabilitation Services Pager [phone] Office [phone]     Valeria Boza [name] 12/03/2018, 4:55 PM

## 2018-12-07 DIAGNOSIS — E1122 Type 2 diabetes mellitus with diabetic chronic kidney disease: Secondary | ICD-10-CM | POA: Diagnosis not present

## 2018-12-07 DIAGNOSIS — I5030 Unspecified diastolic (congestive) heart failure: Secondary | ICD-10-CM | POA: Diagnosis not present

## 2018-12-07 DIAGNOSIS — D631 Anemia in chronic kidney disease: Secondary | ICD-10-CM | POA: Diagnosis not present

## 2018-12-07 DIAGNOSIS — N184 Chronic kidney disease, stage 4 (severe): Secondary | ICD-10-CM | POA: Diagnosis not present

## 2018-12-07 DIAGNOSIS — I130 Hypertensive heart and chronic kidney disease with heart failure and stage 1 through stage 4 chronic kidney disease, or unspecified chronic kidney disease: Secondary | ICD-10-CM | POA: Diagnosis not present

## 2018-12-11 DIAGNOSIS — I2510 Atherosclerotic heart disease of native coronary artery without angina pectoris: Secondary | ICD-10-CM | POA: Diagnosis not present

## 2018-12-11 DIAGNOSIS — N184 Chronic kidney disease, stage 4 (severe): Secondary | ICD-10-CM | POA: Diagnosis not present

## 2018-12-14 ENCOUNTER — Ambulatory Visit: Payer: PPO | Admitting: Allergy and Immunology

## 2018-12-14 ENCOUNTER — Encounter (INDEPENDENT_AMBULATORY_CARE_PROVIDER_SITE_OTHER): Payer: PPO | Admitting: Ophthalmology

## 2018-12-14 ENCOUNTER — Telehealth: Payer: Self-pay | Admitting: Cardiology

## 2018-12-14 DIAGNOSIS — I5030 Unspecified diastolic (congestive) heart failure: Secondary | ICD-10-CM | POA: Diagnosis not present

## 2018-12-14 DIAGNOSIS — N184 Chronic kidney disease, stage 4 (severe): Secondary | ICD-10-CM | POA: Diagnosis not present

## 2018-12-14 DIAGNOSIS — D631 Anemia in chronic kidney disease: Secondary | ICD-10-CM | POA: Diagnosis not present

## 2018-12-14 DIAGNOSIS — E1122 Type 2 diabetes mellitus with diabetic chronic kidney disease: Secondary | ICD-10-CM | POA: Diagnosis not present

## 2018-12-14 DIAGNOSIS — I130 Hypertensive heart and chronic kidney disease with heart failure and stage 1 through stage 4 chronic kidney disease, or unspecified chronic kidney disease: Secondary | ICD-10-CM | POA: Diagnosis not present

## 2018-12-14 NOTE — Telephone Encounter (Signed)
Virtual Visit Pre-Appointment Phone Call  Steps For Call:  1. Confirm consent - "In the setting of the current Covid19 crisis, you are scheduled for a (phone or video) visit with your provider on (date) at (time).  Just as we do with many in-office visits, in order for you to participate in this visit, we must obtain consent.  If you'd like, I can send this to your mychart (if signed up) or email for you to review.  Otherwise, I can obtain your verbal consent now.  All virtual visits are billed to your insurance company just like a normal visit would be.  By agreeing to a virtual visit, we'd like you to understand that the technology does not allow for your provider to perform an examination, and thus may limit your provider's ability to fully assess your condition. If your provider identifies any concerns that need to be evaluated in person, we will make arrangements to do so.  Finally, though the technology is pretty good, we cannot assure that it will always work on either your or our end, and in the setting of a video visit, we may have to convert it to a phone-only visit.  In either situation, we cannot ensure that we have a secure connection.  Are you willing to proceed?" STAFF: Did the patient verbally acknowledge consent to telehealth visit? Document YES/NO here: YES  2. Confirm the BEST phone number to call the day of the visit by including in appointment notes  3. Give patient instructions for WebEx/MyChart download to smartphone as below or Doximity/Doxy.me if video visit (depending on what platform provider is using)  4. Advise patient to be prepared with their blood pressure, heart rate, weight, any heart rhythm information, their current medicines, and a piece of paper and pen handy for any instructions they may receive the day of their visit  5. Inform patient they will receive a phone call 15 minutes prior to their appointment time (may be from unknown caller ID) so they should be  prepared to answer  6. Confirm that appointment type is correct in Epic appointment notes (VIDEO vs PHONE)     TELEPHONE CALL NOTE  Quanda Pavlicek has been deemed a candidate for a follow-up tele-health visit to limit community exposure during the Covid-19 pandemic. I spoke with the patient via phone to ensure availability of phone/video source, confirm preferred email & phone number, and discuss instructions and expectations.  I reminded [name] to be prepared with any vital sign and/or heart rhythm information that could potentially be obtained via home monitoring, at the time of her visit. I reminded [name] to expect a phone call at the time of her visit if her visit.  Frederic Jericho 12/14/2018 3:20 PM   INSTRUCTIONS FOR DOWNLOADING THE [facility] APP TO SMARTPHONE  - If Apple, ask patient to go to App Store and type in WebEx in the search bar. [place] [employer], the blue/green circle. If Android, go to [employer] and type in [employer] in the search bar. The app is free but as with any other app downloads, their phone may require them to verify saved payment information or Apple/Android password.  - The patient does NOT have to create an account. - On the day of the visit, the assist will walk the patient through joining the meeting with the meeting number/password.  INSTRUCTIONS FOR DOWNLOADING THE MYCHART APP TO SMARTPHONE  - The patient must first make sure to have  activated MyChart and know their login information - If Apple, go to [employer] and type in MyChart in the search bar and download the app. If Android, ask patient to go to [employer] and type in [place] in the search bar and download the app. The app is free but as with any other app downloads, their phone may require them to verify saved payment information or Apple/Android password.  - The patient will need to then log into the app with their MyChart username and password, and select [facility]  as their healthcare provider to link the account. When it is time for your visit, go to the MyChart app, find appointments, and click Begin Video Visit. Be sure to Select Allow for your device to access the Microphone and Camera for your visit. You will then be connected, and your provider will be with you shortly.  **If they have any issues connecting, or need assistance please contact MyChart service desk [phone])**  **If using a computer, in order to ensure the best quality for their visit they will need to use either of the following Internet Browsers: [employer], or Google Chrome**  IF USING DOXIMITY or DOXY.ME - The patient will receive a link just prior to their visit, either by text or email (to be determined day of appointment depending on if it's doxy.me or Doximity).     FULL LENGTH CONSENT FOR TELE-HEALTH VISIT   I hereby voluntarily request, consent and authorize [facility] and its employed or contracted physicians, physician assistants, nurse practitioners or other licensed health care professionals (the Practitioner), to provide me with telemedicine health care services (the Services") as deemed necessary by the treating Practitioner. I acknowledge and consent to receive the Services by the Practitioner via telemedicine. I understand that the telemedicine visit will involve communicating with the Practitioner through live audiovisual communication technology and the disclosure of certain medical information by electronic transmission. I acknowledge that I have been given the opportunity to request an in-person assessment or other available alternative prior to the telemedicine visit and am voluntarily participating in the telemedicine visit.  I understand that I have the right to withhold or withdraw my consent to the use of telemedicine in the course of my care at any time, without affecting my right to future care or treatment, and that the Practitioner or  I may terminate the telemedicine visit at any time. I understand that I have the right to inspect all information obtained and/or recorded in the course of the telemedicine visit and may receive copies of available information for a reasonable fee.  I understand that some of the potential risks of receiving the Services via telemedicine include:   Delay or interruption in medical evaluation due to technological equipment failure or disruption;  Information transmitted may not be sufficient (e.g. poor resolution of images) to allow for appropriate medical decision making by the Practitioner; and/or   In rare instances, security protocols could fail, causing a breach of personal health information.  Furthermore, I acknowledge that it is my responsibility to provide information about my medical history, conditions and care that is complete and accurate to the best of my ability. I acknowledge that Practitioner's advice, recommendations, and/or decision may be based on factors not within their control, such as incomplete or inaccurate data provided by me or distortions of diagnostic images or specimens that may result from electronic transmissions. I understand that the practice of medicine is not an exact science and that  Practitioner makes no warranties or guarantees regarding treatment outcomes. I acknowledge that I will receive a copy of this consent concurrently upon execution via email to the email address I last provided but may also request a printed copy by calling the office of [facility].    I understand that my insurance will be billed for this visit.   I have read or had this consent read to me.  I understand the contents of this consent, which adequately explains the benefits and risks of the Services being provided via telemedicine.   I have been provided ample opportunity to ask questions regarding this consent and the Services and have had my questions answered to my  satisfaction.  I give my informed consent for the services to be provided through the use of telemedicine in my medical care  By participating in this telemedicine visit I agree to the above.

## 2018-12-15 DIAGNOSIS — I130 Hypertensive heart and chronic kidney disease with heart failure and stage 1 through stage 4 chronic kidney disease, or unspecified chronic kidney disease: Secondary | ICD-10-CM | POA: Diagnosis not present

## 2018-12-16 DIAGNOSIS — I5032 Chronic diastolic (congestive) heart failure: Secondary | ICD-10-CM

## 2018-12-16 DIAGNOSIS — I5031 Acute diastolic (congestive) heart failure: Secondary | ICD-10-CM

## 2018-12-16 HISTORY — DX: Chronic diastolic (congestive) heart failure: I50.32

## 2018-12-16 NOTE — Progress Notes (Signed)
Virtual Visit via Telephone Note   This visit type was conducted due to national recommendations for restrictions regarding the COVID-19 Pandemic (e.g. social distancing) in an effort to limit this patient's exposure and mitigate transmission in our community.  Due to her co-morbid illnesses, this patient is at least at moderate risk for complications without adequate follow up.  This format is felt to be most appropriate for this patient at this time.  The patient did not have access to video technology/had technical difficulties with video requiring transitioning to audio format only (telephone).  All issues noted in this document were discussed and addressed.  No physical exam could be performed with this format.  Please refer to the patient's chart for her  consent to telehealth for [facility].   Evaluation Performed:  Follow-up visit  Date:  12/17/2018   ID:  Leah Walsh, DOB November 29, 1940, MRN [id number]  Patient Location: Home Provider Location: Home  PCP:  [name], MD  Cardiologist:  No primary care provider on file. Dr Bettina Gavia Electrophysiologist:  None   Chief Complaint:  Establish cardiology care  History of Present Illness:    Leah Walsh is a 78 y.o. female with CAD previous PCI and stent LAD, CKD stage IV, renal artery stenosis  and hypertension followed by [facility] nephrology who was admitted to [facility] with pneumonia 11/23/18 to  12/03/18.  Her case was complicated by a low level troponin elevation not felt to reflect acute coronary syndrome, acute on chronic stage IV CKD, severe anemia hemoglobin 6.9 requiring transfusion and  volume overload with a net gain of 3 kg and acute decompensated heart failure responding to IV diuretics who is referred today to establish cardiology care.  Prior to the visit I reviewed the records from [facility] cardiology care,  Nephrology care, her previous renal vascular duplex September 2019 and the  records from [facility] recent admission.  She was not seen by cardiology during that admission.She tested negative for COVID 19.  The patient does not have symptoms concerning for COVID-19 infection (fever, chills, cough, or new shortness of breath).   Prior to the visit I reviewed extensive records from [facility] in [facility]  She has a longstanding history of renal artery stenosis and had an attempt in 2012 at [facility] for renal angioplasty.  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  Since discharge from the hospital she is felt quite well her weight is been stable 159 260 pounds and her nephrologist decreased her diuretic to once per day.  When she first came home she took high-dose hydralazine 100 mg 3 times daily was having symptomatic orthostatic hypotension it was discontinued and her blood pressures are running 1 49-6 40 systolic.  She had trauma to the lower extremity on the left and has a mild degree of chronic edema no edema in the right leg her weight is stable no orthopnea shortness of breath chest pain palpitation or syncope.  Her cough is resolved there is no wheezing her strength and endurance are improving.  Documentation of Prolonged Non Face-To-Face Time (> 31 minutes) I personally reviewed prior medical records (both internal and external) for this encounter.  This included review of historical hospital records, office notes, cardiac studies (e.g. Echocardiograms, Stress Tests, Cardiac Catheterizations,  Event Monitors, etc), radiologic studies (e.g. MRIs, CTs, Xrays, etc), laboratory data, etc.  The pertinent findings are outlined in my note.  The total non face to face time spent for record review was 35 minutes.  This does not include the review of my own personal notes and records.  Past Medical  History:  Diagnosis Date   Asthma    Diabetes ([place])    Heart attack ([place]) 2014   Hyperlipidemia    Hypertension    Kidney disease    Sleep apnea    Using CPAP   Past Surgical History:  Procedure Laterality Date   ABDOMINAL HYSTERECTOMY     CAROTID STENT     CHOLECYSTECTOMY     TUBAL LIGATION     TUMOR REMOVAL  12/2016   Tumor on ovary   VESICOVAGINAL FISTULA CLOSURE W/ TAH       No outpatient medications have been marked as taking for the 12/17/18 encounter (Appointment) with Richardo Priest, MD.     Allergies:   Compazine [prochlorperazine edisylate]; Colcrys [colchicine]; Hydrocodone; and Codeine   Social History   Tobacco Use   Smoking status: Never Smoker   Smokeless tobacco: Never Used  Substance Use Topics   Alcohol use: No    Alcohol/week: 0.0 standard drinks   Drug use: No     Family Hx: The patient's family history includes Diabetes in her sister and sister; Heart disease in her father, maternal grandfather, maternal grandmother, mother, paternal grandfather, and paternal grandmother; Lung cancer in her brother.  ROS:   Please see the history of present illness.    Review of Systems  Constitution: Negative.  HENT: Negative.   Eyes: Negative.   Cardiovascular: Positive for leg swelling (isolated left leg swelling chronic).  Respiratory: Negative.   Endocrine: Negative.   Hematologic/Lymphatic: Negative.   Skin: Negative.   Musculoskeletal: Negative.   Gastrointestinal: Negative.   Genitourinary: Negative.   Neurological: Negative.   Psychiatric/Behavioral: Negative.   Allergic/Immunologic: Negative.    All other systems reviewed and are negative.   Prior CV studies:   The following studies were reviewed today:  CXR: COMPARISON:  11/30/2018, 11/28/2018, 11/27/2018 FINDINGS: Cardiomediastinal silhouette unchanged in size and contour. No pneumothorax. Opacity at the right lung base persists with obscuration the  right hemidiaphragm. Blunting of left costophrenic angle. Improving aeration in the right mid lung with no new confluent airspace disease. IMPRESSION: Improving aeration of the right upper and mid lung with persisting opacity at the right lung base, potentially combination of pleural fluid and consolidation/atelectasis. Blunting at the left costophrenic angle likely represents small pleural fluid and atelectasis  COMPARISON:  11/23/2018 FINDINGS: Diffuse interstitial opacity with Kerley lines and pleural effusions. There is alveolar opacity asymmetric to the right. Borderline heart size. IMPRESSION: New interstitial and airspace opacity with pleural effusions, a CHF pattern. Pneumonia was suspected on preceding radiograph and would be obscured.  Patient Name:   Leah Walsh Date of Exam: 12/03/2018 Indications:    CHF IMPRESSIONS  1. The left ventricle has normal systolic function, with an ejection fraction of 60-65%. The cavity size was normal. There is moderately increased left ventricular wall thickness.   2. The right ventricle has normal systolc function. The cavity was normal. There is no increase in right ventricular wall thickness.  3. Mild thickening of the mitral valve leaflet. There is mild mitral annular calcification present.  4. The tricuspid valve was grossly normal.  5. The aortic valve is tricuspid Moderate thickening of the  aortic valve Sclerosis without any evidence of stenosis of the aortic valve.  6. The aortic root is normal in size and structure. LEFT VENTRICLE PLAX 2D                 Diastology  pseudonormal pattern  LVIDd:         3.80 cm  LV e' lateral:   7.29 cm/s LVIDs:         2.31 cm  LV E/e' lateral: 14.0 LV PW:         1.52 cm  LV e' medial:    5.11 cm/s LV IVS:        1.59 cm  LV E/e' medial:  20.0   Labs/Other Tests and Data Reviewed:    EKG:  An ECG dated 11/24/18 was personally reviewed today and demonstrated:  [place] LVH 93 BPM non specific ST  depression  Recent Labs:  Basic metabolic panel  (important suggestion)  Newer results are available. Click to view them now.   Ref Range & Units 2wk ago (12/02/18) 2wk ago (12/01/18) 2wk ago (11/30/18) 2wk ago (11/29/18) 2wk ago (11/28/18) 2wk ago (11/27/18)  Sodium 135 - 145 mmol/L 134Low   136  137  136  136  134Low    Potassium 3.5 - 5.1 mmol/L 3.5  3.7  4.0  4.0  4.7  4.5   Chloride 98 - 111 mmol/L 104  104  104  104  107  106   CO2 22 - 32 mmol/L 22  23  21Low   22  20Low   20Low    Glucose, Bld 70 - 99 mg/dL 148High   120High   111High   148High   156High   231High    BUN 8 - 23 mg/dL 46High   53High   57High   61High   65High   65High    Creatinine, Ser 0.44 - 1.00 mg/dL 2.82High   2.94High   3.09High   3.28High   3.27High   3.14High    Calcium 8.9 - 10.3 mg/dL 8.4Low   8.3Low   8.5Low   8.7Low   8.2Low   8.0Low    GFR calc non Af Amer >60 mL/min 16Low   15Low   14Low   13Low   13Low   14Low    GFR calc Af Amer >60 mL/min 18Low   17Low   16Low   15Low   15Low   16Low    Anion gap 5 - 15 8  9  CM 12 CM 10 CM 9 CM 8 CM       CBC   Ref Range & Units 2wk ago (12/02/18) 2wk ago (12/01/18) 2wk ago (11/29/18) 2wk ago (11/28/18) 2wk ago (11/27/18)  WBC 4.0 - 10.5 K/uL 9.9  10.7High   14.0High   13.2High   11.8High    RBC 3.87 - 5.11 MIL/uL 3.30Low   3.34Low   3.52Low   2.80Low   2.95Low    Hemoglobin 12.0 - 15.0 g/dL 8.3Low   8.2Low  CM 8.9Low  CM 6.9Low Panic  CM 7.0Low  CM  Comment: Reticulocyte Hemoglobin testing  may be clinically indicated,  consider ordering this additional  test [id number]   HCT 36.0 - 46.0 % 25.3Low   25.9Low   27.4Low   22.0Low   22.7Low    MCV 80.0 - 100.0 fL 76.7Low   77.5Low   77.8Low   78.6Low   76.9Low    MCH 26.0 - 34.0 pg 25.2Low  24.6Low   25.3Low   24.6Low   23.7Low    MCHC 30.0 - 36.0 g/dL 32.8  31.7  32.5  31.4  30.8   RDW 11.5 - 15.5 % 15.6High   15.5  15.3  15.2  15.1   Platelets 150 - 400 K/uL 272  272  279  279  254   nRBC 0.0 - 0.2 % 0.0  0.0 CM 0.0 CM  0.0 CM 0.0 CM        11/27/2018: B Natriuretic Peptide 427.5 11/29/2018: ALT 21 12/02/2018: Hemoglobin 8.3; Platelets 272 12/03/2018: BUN 43; Creatinine, Ser 2.90; Potassium 3.7; Sodium 135   Recent Lipid Panel No results found for: CHOL, TRIG, HDL, CHOLHDL, LDLCALC, LDLDIRECT  Wt Readings from Last 3 Encounters:  12/03/18 164 lb 12.8 oz (74.8 kg)  04/29/18 176 lb 3.2 oz (79.9 kg)  04/29/17 174 lb 12.8 oz (79.3 kg)     Objective:    Vital Signs:  There were no vitals taken for this visit.   VITAL SIGNS:  reviewed her mood affect thought and perception are normal she is alert and oriented x3 no audible wheeze or respiratory distress with conversation  ASSESSMENT & PLAN:    1. Heart failure initially acute diastolic now chronic precipitated by acute medical illness anemia transfusion IV fluid loading.  Her diuretic is been decreased her weights are stable she will take an extra dose if she is 162 pounds or greater and we can consider in the future whether she needs to remain on a diuretic daily or just take as needed.  Should continue sodium restrict and weigh daily. 2. CAD stable [place] Heart Association class I continue medical therapy including aspirin high intensity statin and Zetia oral nitrate and calcium channel blocker.  At this time I would not advise an ischemia evaluation 3. Elevated troponin with acute medical illness nonspecific no evidence of acute coronary syndrome. 4. Renal artery stenosis bilateral chronic stable managed by nephrology she has stage IV CKD. 5. Anemia improved after transfusion she also received iron in hospital she will need serial hemoglobins and should keep her hemoglobin greater than 8.5 6. With underlying heart disease 7. Hypertensive heart and chronic kidney disease with heart failure and stage IV CKD stable blood pressure target and continue current medications including clonidine.  COVID-19 Education: The signs and symptoms of COVID-19 were  discussed with the patient and how to seek care for testing (follow up with PCP or arrange E-visit).  The importance of social distancing was discussed today.  Time:   Today, I have spent 30 minutes with the patient with telehealth technology discussing the above problems.     Medication Adjustments/Labs and Tests Ordered: Current medicines are reviewed at length with the patient today.  Concerns regarding medicines are outlined above.   Tests Ordered: No orders of the defined types were placed in this encounter.   Medication Changes: No orders of the defined types were placed in this encounter.   Disposition:  Follow up June 2020  SignedShirlee More, MD  12/17/2018 8:23 AM    [facility]

## 2018-12-17 ENCOUNTER — Other Ambulatory Visit: Payer: Self-pay

## 2018-12-17 ENCOUNTER — Encounter: Payer: Self-pay | Admitting: Cardiology

## 2018-12-17 ENCOUNTER — Telehealth (INDEPENDENT_AMBULATORY_CARE_PROVIDER_SITE_OTHER): Payer: PPO | Admitting: Cardiology

## 2018-12-17 VITALS — BP 139/59 | HR 64 | Wt 159.2 lb

## 2018-12-17 DIAGNOSIS — I130 Hypertensive heart and chronic kidney disease with heart failure and stage 1 through stage 4 chronic kidney disease, or unspecified chronic kidney disease: Secondary | ICD-10-CM

## 2018-12-17 DIAGNOSIS — D631 Anemia in chronic kidney disease: Secondary | ICD-10-CM

## 2018-12-17 DIAGNOSIS — I701 Atherosclerosis of renal artery: Secondary | ICD-10-CM

## 2018-12-17 DIAGNOSIS — Z7189 Other specified counseling: Secondary | ICD-10-CM

## 2018-12-17 DIAGNOSIS — N184 Chronic kidney disease, stage 4 (severe): Secondary | ICD-10-CM

## 2018-12-17 DIAGNOSIS — R7989 Other specified abnormal findings of blood chemistry: Secondary | ICD-10-CM

## 2018-12-17 DIAGNOSIS — I5031 Acute diastolic (congestive) heart failure: Secondary | ICD-10-CM

## 2018-12-17 DIAGNOSIS — R778 Other specified abnormalities of plasma proteins: Secondary | ICD-10-CM

## 2018-12-17 DIAGNOSIS — I2510 Atherosclerotic heart disease of native coronary artery without angina pectoris: Secondary | ICD-10-CM

## 2018-12-17 HISTORY — DX: Anemia in chronic kidney disease: D63.1

## 2018-12-17 MED ORDER — ATORVASTATIN CALCIUM 80 MG PO TABS: 80.0000 mg | ORAL_TABLET | Freq: Every day | ORAL | 3 refills | Status: DC

## 2018-12-17 MED ORDER — EZETIMIBE 10 MG PO TABS: 10.0000 mg | ORAL_TABLET | Freq: Every day | ORAL | 3 refills | Status: DC

## 2018-12-17 MED ORDER — CLONIDINE HCL 0.1 MG PO TABS: 0.1000 mg | ORAL_TABLET | Freq: Three times a day (TID) | ORAL | 3 refills | Status: DC

## 2018-12-17 MED ORDER — FUROSEMIDE 40 MG PO TABS: ORAL_TABLET | ORAL | 3 refills | Status: DC

## 2018-12-17 MED ORDER — ISOSORBIDE MONONITRATE ER 120 MG PO TB24: 120.0000 mg | ORAL_TABLET | Freq: Every day | ORAL | 3 refills | Status: DC

## 2018-12-17 MED ORDER — NIFEDIPINE ER 90 MG PO TB24: 90.0000 mg | ORAL_TABLET | Freq: Every day | ORAL | 3 refills | Status: DC

## 2018-12-17 MED ORDER — CARVEDILOL 25 MG PO TABS: 25.0000 mg | ORAL_TABLET | Freq: Two times a day (BID) | ORAL | 3 refills | Status: DC

## 2018-12-17 MED ORDER — NITROGLYCERIN 0.4 MG SL SUBL: 0.4000 mg | SUBLINGUAL_TABLET | SUBLINGUAL | 3 refills | Status: DC | PRN

## 2018-12-17 NOTE — Patient Instructions (Addendum)
Medication Instructions:  Your physician has recommended you make the following change in your medication:   INCREASE furosemide (lasix) 40 mg: Take 1 tablet daily in the morning. Take 1 extra tablet (40 mg) in the afternoon if your weight is 162 or greater.  If you need a refill on your cardiac medications before your next appointment, please call your pharmacy.   Lab work: None  If you have labs (blood work) drawn today and your tests are completely normal, you will receive your results only by: [name] MyChart Message (if you have MyChart) OR . A paper copy in the mail If you have any lab test that is abnormal or we need to change your treatment, we will call you to review the results.  Testing/Procedures: None  Follow-Up: At [facility], you and your health needs are our priority.  As part of our continuing mission to provide you with exceptional heart care, we have created designated Provider Care Teams.  These Care Teams include your primary Cardiologist (physician) and Advanced Practice Providers (APPs -  Physician Assistants and Nurse Practitioners) who all work together to provide you with the care you need, when you need it. You will need a follow up televisit appointment in 2 months: Thursday, 02/18/2019, at 3:00 pm.       Heart Failure  Weigh yourself every morning when you first wake up and record on a calender or note pad, bring this to your office visits. Using a pill tender can help with taking your medications consistently.  Limit your fluid intake to 2 liters daily  Limit your sodium intake to less than 2-3 grams daily. Ask if you need dietary teaching.  If you gain more than 3 pounds (from your dry weight ), double your dose of diuretic for the day.  If you gain more than 5 pounds (from your dry weight), double your dose of lasix and call your heart failure doctor.  Please do not smoke tobacco since it is very bad for your heart.  Please do not drink alcohol  since it can worsen your heart failure.Also avoid OTC nonsteroidal drugs, such as advil, aleve and motrin.  Try to exercise for at least 30 minutes every day because this will help your heart be more efficient. You may be eligible for supervised cardiac rehab, ask your physician.

## 2018-12-21 DIAGNOSIS — I129 Hypertensive chronic kidney disease with stage 1 through stage 4 chronic kidney disease, or unspecified chronic kidney disease: Secondary | ICD-10-CM | POA: Diagnosis not present

## 2018-12-21 DIAGNOSIS — Z794 Long term (current) use of insulin: Secondary | ICD-10-CM | POA: Diagnosis not present

## 2018-12-21 DIAGNOSIS — N184 Chronic kidney disease, stage 4 (severe): Secondary | ICD-10-CM | POA: Diagnosis not present

## 2018-12-21 DIAGNOSIS — D631 Anemia in chronic kidney disease: Secondary | ICD-10-CM | POA: Diagnosis not present

## 2018-12-21 DIAGNOSIS — N179 Acute kidney failure, unspecified: Secondary | ICD-10-CM | POA: Insufficient documentation

## 2018-12-21 DIAGNOSIS — E559 Vitamin D deficiency, unspecified: Secondary | ICD-10-CM | POA: Diagnosis not present

## 2018-12-21 DIAGNOSIS — E889 Metabolic disorder, unspecified: Secondary | ICD-10-CM | POA: Diagnosis not present

## 2018-12-21 DIAGNOSIS — E1122 Type 2 diabetes mellitus with diabetic chronic kidney disease: Secondary | ICD-10-CM | POA: Diagnosis not present

## 2018-12-21 DIAGNOSIS — M9080 Osteopathy in diseases classified elsewhere, unspecified site: Secondary | ICD-10-CM | POA: Diagnosis not present

## 2018-12-21 DIAGNOSIS — I701 Atherosclerosis of renal artery: Secondary | ICD-10-CM | POA: Diagnosis not present

## 2018-12-21 HISTORY — DX: Acute kidney failure, unspecified: N17.9

## 2018-12-23 DIAGNOSIS — I129 Hypertensive chronic kidney disease with stage 1 through stage 4 chronic kidney disease, or unspecified chronic kidney disease: Secondary | ICD-10-CM | POA: Diagnosis not present

## 2018-12-23 DIAGNOSIS — N184 Chronic kidney disease, stage 4 (severe): Secondary | ICD-10-CM | POA: Diagnosis not present

## 2018-12-24 DIAGNOSIS — I10 Essential (primary) hypertension: Secondary | ICD-10-CM | POA: Diagnosis not present

## 2018-12-24 DIAGNOSIS — E785 Hyperlipidemia, unspecified: Secondary | ICD-10-CM | POA: Diagnosis not present

## 2018-12-28 DIAGNOSIS — I130 Hypertensive heart and chronic kidney disease with heart failure and stage 1 through stage 4 chronic kidney disease, or unspecified chronic kidney disease: Secondary | ICD-10-CM | POA: Diagnosis not present

## 2019-01-01 ENCOUNTER — Ambulatory Visit: Payer: PPO | Admitting: *Deleted

## 2019-01-23 DIAGNOSIS — E559 Vitamin D deficiency, unspecified: Secondary | ICD-10-CM | POA: Diagnosis not present

## 2019-01-23 DIAGNOSIS — I10 Essential (primary) hypertension: Secondary | ICD-10-CM | POA: Diagnosis not present

## 2019-02-01 ENCOUNTER — Other Ambulatory Visit: Payer: Self-pay

## 2019-02-01 ENCOUNTER — Encounter (INDEPENDENT_AMBULATORY_CARE_PROVIDER_SITE_OTHER): Payer: PPO | Admitting: Ophthalmology

## 2019-02-01 DIAGNOSIS — I10 Essential (primary) hypertension: Secondary | ICD-10-CM | POA: Diagnosis not present

## 2019-02-01 DIAGNOSIS — H43813 Vitreous degeneration, bilateral: Secondary | ICD-10-CM

## 2019-02-01 DIAGNOSIS — H35033 Hypertensive retinopathy, bilateral: Secondary | ICD-10-CM

## 2019-02-01 DIAGNOSIS — H34831 Tributary (branch) retinal vein occlusion, right eye, with macular edema: Secondary | ICD-10-CM | POA: Diagnosis not present

## 2019-02-01 DIAGNOSIS — H353132 Nonexudative age-related macular degeneration, bilateral, intermediate dry stage: Secondary | ICD-10-CM | POA: Diagnosis not present

## 2019-02-03 DIAGNOSIS — E1165 Type 2 diabetes mellitus with hyperglycemia: Secondary | ICD-10-CM | POA: Diagnosis not present

## 2019-02-03 DIAGNOSIS — R8290 Unspecified abnormal findings in urine: Secondary | ICD-10-CM | POA: Diagnosis not present

## 2019-02-08 DIAGNOSIS — E7800 Pure hypercholesterolemia, unspecified: Secondary | ICD-10-CM | POA: Diagnosis not present

## 2019-02-08 DIAGNOSIS — Z6825 Body mass index (BMI) 25.0-25.9, adult: Secondary | ICD-10-CM | POA: Diagnosis not present

## 2019-02-08 DIAGNOSIS — N184 Chronic kidney disease, stage 4 (severe): Secondary | ICD-10-CM | POA: Diagnosis not present

## 2019-02-08 DIAGNOSIS — E1165 Type 2 diabetes mellitus with hyperglycemia: Secondary | ICD-10-CM | POA: Diagnosis not present

## 2019-02-10 DIAGNOSIS — N184 Chronic kidney disease, stage 4 (severe): Secondary | ICD-10-CM | POA: Diagnosis not present

## 2019-02-10 DIAGNOSIS — I129 Hypertensive chronic kidney disease with stage 1 through stage 4 chronic kidney disease, or unspecified chronic kidney disease: Secondary | ICD-10-CM | POA: Diagnosis not present

## 2019-02-10 DIAGNOSIS — N179 Acute kidney failure, unspecified: Secondary | ICD-10-CM | POA: Diagnosis not present

## 2019-02-17 DIAGNOSIS — M9080 Osteopathy in diseases classified elsewhere, unspecified site: Secondary | ICD-10-CM | POA: Diagnosis not present

## 2019-02-17 DIAGNOSIS — E876 Hypokalemia: Secondary | ICD-10-CM | POA: Diagnosis not present

## 2019-02-17 DIAGNOSIS — I129 Hypertensive chronic kidney disease with stage 1 through stage 4 chronic kidney disease, or unspecified chronic kidney disease: Secondary | ICD-10-CM | POA: Diagnosis not present

## 2019-02-17 DIAGNOSIS — D631 Anemia in chronic kidney disease: Secondary | ICD-10-CM | POA: Diagnosis not present

## 2019-02-17 DIAGNOSIS — E889 Metabolic disorder, unspecified: Secondary | ICD-10-CM | POA: Diagnosis not present

## 2019-02-17 DIAGNOSIS — N184 Chronic kidney disease, stage 4 (severe): Secondary | ICD-10-CM | POA: Diagnosis not present

## 2019-02-17 DIAGNOSIS — E559 Vitamin D deficiency, unspecified: Secondary | ICD-10-CM | POA: Diagnosis not present

## 2019-02-17 HISTORY — DX: Hypokalemia: E87.6

## 2019-02-17 NOTE — Progress Notes (Signed)
Cardiology Office Note:    Date:  02/18/2019   ID:  [name], DOB Oct 28, 1940, MRN [id number]  PCP:  [name], MD  Cardiologist:  Shirlee More, MD    Referring MD: [name], MD    ASSESSMENT:    1. Chronic diastolic heart failure ([place])   2. Hypertensive heart and kidney disease with chronic diastolic congestive heart failure and stage 4 chronic kidney disease ([place])   3. Coronary artery disease involving native coronary artery of native heart without angina pectoris   4. Anemia in stage 4 chronic kidney disease ([place])   5. Hyperlipidemia, unspecified hyperlipidemia type   6. Insulin-requiring or dependent type II diabetes mellitus ([place])    PLAN:    In order of problems listed above:  1. Heart failure mildly decompensated weight gain peripheral edema could be due to calcium channel blocker but suspected certainly decompensated heart failure.  I will switch her from furosemide to torsemide and asked her to call me in a week if her weights are not down below 165 pounds.  If that is the case she may require intermittent metolazone therapy. 2. Hypertensive heart chronic kidney disease stable she has not been advised fistula to prepare for renal replacement therapy and BP at target continue current antihypertensives 3. CAD stable continue medical therapy [place] Heart Association class I 4. Anemia managed by nephrology due to CKD 5. Hyperlipidemia stable continue statin 6. Diabetes stable managed by her PCP   Next appointment: 3 months   Medication Adjustments/Labs and Tests Ordered: Current medicines are reviewed at length with the patient today.  Concerns regarding medicines are outlined above.  No orders of the defined types were placed in this encounter.  No orders of the defined types were placed in this encounter.   Chief Complaint  Patient presents with   Cardiac Valve Problem   Congestive Heart Failure    History of Present Illness:    Leah Walsh  is a 78 y.o. female with a hx of CAD previous PCI and stent LAD, CKD stage IV, renal artery stenosis  and hypertension followed by [facility] nephrology who was admitted to [facility] with pneumonia 11/23/18 to  12/03/18.  Her case was complicated by a low level troponin elevation not felt to reflect acute coronary syndrome, acute on chronic stage IV CKD, severe anemia hemoglobin 6.9 requiring transfusion and  volume overload with a net gain of 3 kg and acute decompensated heart failure responding to IV diuretics who was last seen 12/17/2018 to establish cardiology care.  Contains abnormal data Basic metabolic panel  Order: [id number]  Status:  Final result   Visible to patient:  No (Not Released) Next appt:  Today at 03:00 PM in Cardiology Shirlee More, MD)  (important suggestion)  Newer results are available. Click to view them now.   Ref Range & Units 2wk ago (12/02/18) 2wk ago (12/01/18) 2wk ago (11/30/18) 2wk ago (11/29/18) 2wk ago (11/28/18) 2wk ago (11/27/18)  Sodium 135 - 145 mmol/L 134Low   136  137  136  136  134Low    Potassium 3.5 - 5.1 mmol/L 3.5  3.7  4.0  4.0  4.7  4.5   Chloride 98 - 111 mmol/L 104  104  104  104  107  106   CO2 22 - 32 mmol/L 22  23  21Low   22  20Low   20Low    Glucose, Bld 70 - 99 mg/dL 148High   120High  111High   148High   156High   231High    BUN 8 - 23 mg/dL 46High   53High   57High   61High   65High   65High    Creatinine, Ser 0.44 - 1.00 mg/dL 2.82High   2.94High   3.09High   3.28High   3.27High   3.14High    Calcium 8.9 - 10.3 mg/dL 8.4Low   8.3Low   8.5Low   8.7Low   8.2Low   8.0Low    GFR calc non Af Amer >60 mL/min 16Low   15Low   14Low   13Low   13Low   14Low    GFR calc Af Amer >60 mL/min 18Low   17Low   16Low   15Low   15Low   16Low    Anion gap 5 - 15 8  9  CM 12 CM 10 CM 9 CM 8 CM       CBC  Order: [id number]  Status:  Final result   Visible to patient:  No (Not Released) Next appt:  Today at 03:00 PM in Cardiology Shirlee More, MD)   Ref Range &  Units 2wk ago (12/02/18) 2wk ago (12/01/18) 2wk ago (11/29/18) 2wk ago (11/28/18) 2wk ago (11/27/18)  WBC 4.0 - 10.5 K/uL 9.9  10.7High   14.0High   13.2High   11.8High    RBC 3.87 - 5.11 MIL/uL 3.30Low   3.34Low   3.52Low   2.80Low   2.95Low    Hemoglobin 12.0 - 15.0 g/dL 8.3Low   8.2Low  CM 8.9Low  CM 6.9Low Panic  CM 7.0Low  CM  Comment: Reticulocyte Hemoglobin testing  may be clinically indicated,  consider ordering this additional  test [id number]   HCT 36.0 - 46.0 % 25.3Low   25.9Low   27.4Low   22.0Low   22.7Low    MCV 80.0 - 100.0 fL 76.7Low   77.5Low   77.8Low   78.6Low   76.9Low    MCH 26.0 - 34.0 pg 25.2Low   24.6Low   25.3Low   24.6Low   23.7Low    MCHC 30.0 - 36.0 g/dL 32.8  31.7  32.5  31.4  30.8   RDW 11.5 - 15.5 % 15.6High   15.5  15.3  15.2  15.1   Platelets 150 - 400 K/uL 272  272  279  279  254   nRBC 0.0 - 0.2 % 0.0  0.0 CM 0.0 CM 0.0 CM 0.0 CM       Creatinine, Ser 0.44 - 1.00 mg/dL 3.14High  3.20High  3.26High  3.35High   Ref Range & Units2wk ago B Natriuretic Peptide0.0 - 100.0 JE/HU314.5High   COMPARISON:  11/30/2018, 11/28/2018, 11/27/2018   FINDINGS: Cardiomediastinal silhouette unchanged in size and contour.   No pneumothorax.   Opacity at the right lung base persists with obscuration the right hemidiaphragm. Blunting of left costophrenic angle.   Improving aeration in the right mid lung with no new confluent airspace disease.   IMPRESSION: Improving aeration of the right upper and mid lung with persisting opacity at the right lung base, potentially combination of pleural fluid and consolidation/atelectasis.   Blunting at the left costophrenic angle likely represents small pleural fluid and atelectasis COMPARISON:  11/23/2018   FINDINGS: Diffuse interstitial opacity with Kerley lines and pleural effusions. There is alveolar opacity asymmetric to the right. Borderline heart size.   IMPRESSION: New interstitial and airspace opacity with pleural  effusions, a CHF pattern. Pneumonia was suspected on preceding radiograph and would  be obscured. Patient Name:   Leah Walsh Date of Exam: 12/03/2018 Medical Rec #:  [id number]     Height:       68.0 in Accession #:    [id number]    Weight:       164.8 lb Date of Birth:  1940/08/27      BSA:          1.88 m Patient Age:    78 years      BP:           149/44 mmHg Patient Gender: F             HR:           66 bpm. Exam Location:  Inpatient     Procedure: Limited Echo, Cardiac Doppler and Color Doppler   Indications:    CHF   History:        Patient has no prior history of Echocardiogram examinations. CAD                 Risk Factors: Hypertension, Diabetes and Dyslipidemia. CKD, PNA.   Sonographer:    Dustin Flock Referring Phys: [phone] [place]      1. The left ventricle has normal systolic function, with an ejection fraction of 60-65%. The cavity size was normal. There is moderately increased left ventricular wall thickness. Left ventricular diastolic parameters were normal.  2. The right ventricle has normal systolc function. The cavity was normal. There is no increase in right ventricular wall thickness.  3. Mild thickening of the mitral valve leaflet. There is mild mitral annular calcification present.  4. The tricuspid valve was grossly normal.  5. The aortic valve is tricuspid Moderate thickening of the aortic valve Sclerosis without any evidence of stenosis of the aortic valve.  6. The aortic root is normal in size and structure.  Compliance with diet, lifestyle and medications: Yes  Her weight is up somewhere in the range of 7 to 8 pounds since her hospital discharge April.  She has edema but no shortness of breath chest pain palpitation orthopnea.  Lab work was done this week with a primary care office and is not available in the [facility].  She was seen by nephrology yesterday she remains anemic they are managing. Past Medical History:  Diagnosis Date    Asthma    Diabetes ([place])    Heart attack ([place]) 2014   Hyperlipidemia    Hypertension    Kidney disease    Sleep apnea    Using CPAP    Past Surgical History:  Procedure Laterality Date   ABDOMINAL HYSTERECTOMY     CAROTID STENT     CHOLECYSTECTOMY     TUBAL LIGATION     TUMOR REMOVAL  12/2016   Tumor on ovary   VESICOVAGINAL FISTULA CLOSURE W/ TAH      Current Medications: Current Meds  Medication Sig   acetaminophen (TYLENOL) 500 MG tablet Take 1,000 mg by mouth every 8 (eight) hours as needed (pain / fever).   albuterol (PROVENTIL HFA) 108 (90 Base) MCG/ACT inhaler Inhale two puffs every four to six hours as needed for cough or wheeze   allopurinol (ZYLOPRIM) 100 MG tablet Take 100 mg by mouth daily.   aspirin 81 MG tablet Take 81 mg by mouth at bedtime.    atorvastatin (LIPITOR) 80 MG tablet Take 1 tablet (80 mg total) by mouth at bedtime.   B-D UF III MINI PEN  NEEDLES 31G X 5 MM MISC    beclomethasone (QVAR) 80 MCG/ACT inhaler Inhale 2 puffs into the lungs 2 (two) times daily.   carvedilol (COREG) 25 MG tablet Take 1 tablet (25 mg total) by mouth 2 (two) times daily with a meal.   Cholecalciferol (VITAMIN D) 2000 UNITS CAPS Take 1 capsule by mouth daily.   cloNIDine (CATAPRES) 0.1 MG tablet Take 1 tablet (0.1 mg total) by mouth 3 (three) times daily.   Coenzyme Q10 (COQ10) 200 MG CAPS Take 1 capsule by mouth daily.   cycloSPORINE (RESTASIS) 0.05 % ophthalmic emulsion Place 1 drop into both eyes 2 (two) times daily.   ezetimibe (ZETIA) 10 MG tablet Take 1 tablet (10 mg total) by mouth daily.   furosemide (LASIX) 40 MG tablet Take 1 tablet daily in the morning. Take 1 extra tablet (40 mg) in the afternoon if your weight is 162 or greater.   Insulin Glargine (LANTUS SOLOSTAR) 100 UNIT/ML Solostar Pen Inject 13 Units into the skin 2 (two) times daily. (Patient taking differently: Inject into the skin as directed. 40 units in the morning and 16  units in the evening)   isosorbide mononitrate (IMDUR) 120 MG 24 hr tablet Take 1 tablet (120 mg total) by mouth daily for 30 days.   montelukast (SINGULAIR) 10 MG tablet Take 1 tablet (10 mg total) by mouth daily.   Multiple Vitamin (MULTIVITAMIN) tablet Take 1 tablet by mouth daily.   Multiple Vitamins-Minerals (PRESERVISION AREDS 2 PO) Take 1 capsule by mouth 2 (two) times daily.    NIFEdipine (ADALAT CC) 90 MG 24 hr tablet Take 1 tablet (90 mg total) by mouth daily.   nitroGLYCERIN (NITROSTAT) 0.4 MG SL tablet Place 1 tablet (0.4 mg total) under the tongue every 5 (five) minutes as needed for chest pain.   NOVOLOG FLEXPEN 100 UNIT/ML FlexPen Inject 2 Units into the skin daily before lunch. (Patient taking differently: Inject 16 Units into the skin 3 (three) times daily. )   omeprazole (PRILOSEC) 20 MG capsule Take 20 mg by mouth daily.    sertraline (ZOLOFT) 25 MG tablet Take 25 mg by mouth daily.     Allergies:   Compazine [prochlorperazine edisylate], Colcrys [colchicine], Hydrocodone, and Codeine   Social History   Socioeconomic History   Marital status: Married    Spouse name: Not on file   Number of children: Not on file   Years of education: Not on file   Highest education level: Not on file  Occupational History   Occupation: retired  Scientist, product/process development strain: Not on file   Food insecurity    Worry: Not on file    Inability: Not on Lexicographer needs    Medical: Not on file    Non-medical: Not on file  Tobacco Use   Smoking status: Never Smoker   Smokeless tobacco: Never Used  Substance and Sexual Activity   Alcohol use: No    Alcohol/week: 0.0 standard drinks   Drug use: No   Sexual activity: Not on file  Lifestyle   Physical activity    Days per week: Not on file    Minutes per session: Not on file   Stress: Not on file  Relationships   Social connections    Talks on phone: Not on file    Gets together: Not  on file    Attends religious service: Not on file    Active member of club or organization: Not on  file    Attends meetings of clubs or organizations: Not on file    Relationship status: Not on file  Other Topics Concern   Not on file  Social History Narrative   Not on file     Family History: The patient's family history includes Diabetes in her sister and sister; Heart disease in her father, maternal grandfather, maternal grandmother, mother, paternal grandfather, and paternal grandmother; Lung cancer in her brother. ROS:   Please see the history of present illness.    All other systems reviewed and are negative.  EKGs/Labs/Other Studies Reviewed:    The following studies were reviewed today:  Recent Labs: 11/27/2018: B Natriuretic Peptide 427.5 11/29/2018: ALT 21 12/02/2018: Hemoglobin 8.3; Platelets 272 12/03/2018: BUN 43; Creatinine, Ser 2.90; Potassium 3.7; Sodium 135  Recent Lipid Panel No results found for: CHOL, TRIG, HDL, CHOLHDL, VLDL, LDLCALC, LDLDIRECT  Physical Exam:    VS:  BP (!) 124/54 (BP Location: Left Arm, Patient Position: Sitting, Cuff Size: Normal)    Pulse 65    Temp 98.8 F (37.1 C)    Wt 167 lb (75.8 kg)    SpO2 98%    BMI 25.39 kg/m     Wt Readings from Last 3 Encounters:  02/18/19 167 lb (75.8 kg)  12/17/18 159 lb 3.2 oz (72.2 kg)  12/03/18 164 lb 12.8 oz (74.8 kg)     GEN:  Well nourished, well developed in no acute distress HEENT: Normal NECK: No JVD; No carotid bruits LYMPHATICS: No lymphadenopathy CARDIAC: RRR, no murmurs, rubs, gallops RESPIRATORY:  Clear to auscultation without rales, wheezing or rhonchi  ABDOMEN: Soft, non-tender, non-distended MUSCULOSKELETAL:  2+ bilateral to the knee edema; No deformity  SKIN: Warm and dry NEUROLOGIC:  Alert and oriented x 3 PSYCHIATRIC:  Normal affect    Signed, Shirlee More, MD  02/18/2019 3:51 PM    [facility]

## 2019-02-18 ENCOUNTER — Encounter: Payer: Self-pay | Admitting: Cardiology

## 2019-02-18 ENCOUNTER — Other Ambulatory Visit: Payer: Self-pay

## 2019-02-18 ENCOUNTER — Ambulatory Visit (INDEPENDENT_AMBULATORY_CARE_PROVIDER_SITE_OTHER): Payer: PPO | Admitting: Cardiology

## 2019-02-18 VITALS — BP 124/54 | HR 65 | Temp 98.8°F | Wt 167.0 lb

## 2019-02-18 DIAGNOSIS — I5032 Chronic diastolic (congestive) heart failure: Secondary | ICD-10-CM

## 2019-02-18 DIAGNOSIS — N184 Chronic kidney disease, stage 4 (severe): Secondary | ICD-10-CM | POA: Diagnosis not present

## 2019-02-18 DIAGNOSIS — D631 Anemia in chronic kidney disease: Secondary | ICD-10-CM | POA: Diagnosis not present

## 2019-02-18 DIAGNOSIS — I130 Hypertensive heart and chronic kidney disease with heart failure and stage 1 through stage 4 chronic kidney disease, or unspecified chronic kidney disease: Secondary | ICD-10-CM | POA: Diagnosis not present

## 2019-02-18 DIAGNOSIS — E785 Hyperlipidemia, unspecified: Secondary | ICD-10-CM | POA: Diagnosis not present

## 2019-02-18 DIAGNOSIS — Z794 Long term (current) use of insulin: Secondary | ICD-10-CM | POA: Diagnosis not present

## 2019-02-18 DIAGNOSIS — I2510 Atherosclerotic heart disease of native coronary artery without angina pectoris: Secondary | ICD-10-CM | POA: Diagnosis not present

## 2019-02-18 DIAGNOSIS — E119 Type 2 diabetes mellitus without complications: Secondary | ICD-10-CM

## 2019-02-18 MED ORDER — TORSEMIDE 20 MG PO TABS: 20.0000 mg | ORAL_TABLET | Freq: Two times a day (BID) | ORAL | 1 refills | Status: DC

## 2019-02-18 NOTE — Patient Instructions (Addendum)
Medication Instructions:  Your physician has recommended you make the following change in your medication:   STOP: Furosemide  START: Torsemide 20 mg : TAke 1 tab twice daily  CALL IN 1 week if weight remains greater than 165 LBS  If you need a refill on your cardiac medications before your next appointment, please call your pharmacy.   Lab work: None If you have labs (blood work) drawn today and your tests are completely normal, you will receive your results only by: [name] MyChart Message (if you have MyChart) OR . A paper copy in the mail If you have any lab test that is abnormal or we need to change your treatment, we will call you to review the results.  Testing/Procedures: NOne  Follow-Up: At [facility], you and your health needs are our priority.  As part of our continuing mission to provide you with exceptional heart care, we have created designated Provider Care Teams.  These Care Teams include your primary Cardiologist (physician) and Advanced Practice Providers (APPs -  Physician Assistants and Nurse Practitioners) who all work together to provide you with the care you need, when you need it. You will need a follow up appointment in 3 months.   Any Other Special Instructions Will Be Listed Below (If Applicable).

## 2019-02-23 DIAGNOSIS — E1165 Type 2 diabetes mellitus with hyperglycemia: Secondary | ICD-10-CM | POA: Diagnosis not present

## 2019-02-23 DIAGNOSIS — E785 Hyperlipidemia, unspecified: Secondary | ICD-10-CM | POA: Diagnosis not present

## 2019-03-01 ENCOUNTER — Other Ambulatory Visit: Payer: Self-pay

## 2019-03-01 ENCOUNTER — Encounter (INDEPENDENT_AMBULATORY_CARE_PROVIDER_SITE_OTHER): Payer: PPO | Admitting: Ophthalmology

## 2019-03-01 DIAGNOSIS — I10 Essential (primary) hypertension: Secondary | ICD-10-CM | POA: Diagnosis not present

## 2019-03-01 DIAGNOSIS — H34831 Tributary (branch) retinal vein occlusion, right eye, with macular edema: Secondary | ICD-10-CM | POA: Diagnosis not present

## 2019-03-01 DIAGNOSIS — H43813 Vitreous degeneration, bilateral: Secondary | ICD-10-CM

## 2019-03-01 DIAGNOSIS — H353132 Nonexudative age-related macular degeneration, bilateral, intermediate dry stage: Secondary | ICD-10-CM

## 2019-03-01 DIAGNOSIS — H35033 Hypertensive retinopathy, bilateral: Secondary | ICD-10-CM | POA: Diagnosis not present

## 2019-03-08 DIAGNOSIS — H16223 Keratoconjunctivitis sicca, not specified as Sjogren's, bilateral: Secondary | ICD-10-CM | POA: Diagnosis not present

## 2019-03-08 DIAGNOSIS — H40013 Open angle with borderline findings, low risk, bilateral: Secondary | ICD-10-CM | POA: Diagnosis not present

## 2019-03-08 DIAGNOSIS — H1045 Other chronic allergic conjunctivitis: Secondary | ICD-10-CM | POA: Diagnosis not present

## 2019-03-08 DIAGNOSIS — H04123 Dry eye syndrome of bilateral lacrimal glands: Secondary | ICD-10-CM | POA: Diagnosis not present

## 2019-03-26 DIAGNOSIS — E119 Type 2 diabetes mellitus without complications: Secondary | ICD-10-CM | POA: Diagnosis not present

## 2019-03-26 DIAGNOSIS — E785 Hyperlipidemia, unspecified: Secondary | ICD-10-CM | POA: Diagnosis not present

## 2019-04-05 ENCOUNTER — Other Ambulatory Visit: Payer: Self-pay | Admitting: Cardiology

## 2019-04-12 ENCOUNTER — Other Ambulatory Visit: Payer: Self-pay

## 2019-04-12 ENCOUNTER — Encounter (INDEPENDENT_AMBULATORY_CARE_PROVIDER_SITE_OTHER): Payer: PPO | Admitting: Ophthalmology

## 2019-04-12 DIAGNOSIS — H43813 Vitreous degeneration, bilateral: Secondary | ICD-10-CM | POA: Diagnosis not present

## 2019-04-12 DIAGNOSIS — H34831 Tributary (branch) retinal vein occlusion, right eye, with macular edema: Secondary | ICD-10-CM | POA: Diagnosis not present

## 2019-04-12 DIAGNOSIS — H353132 Nonexudative age-related macular degeneration, bilateral, intermediate dry stage: Secondary | ICD-10-CM

## 2019-04-12 DIAGNOSIS — I10 Essential (primary) hypertension: Secondary | ICD-10-CM | POA: Diagnosis not present

## 2019-04-12 DIAGNOSIS — H35033 Hypertensive retinopathy, bilateral: Secondary | ICD-10-CM

## 2019-04-15 DIAGNOSIS — N184 Chronic kidney disease, stage 4 (severe): Secondary | ICD-10-CM | POA: Diagnosis not present

## 2019-04-15 DIAGNOSIS — R8290 Unspecified abnormal findings in urine: Secondary | ICD-10-CM | POA: Diagnosis not present

## 2019-04-20 DIAGNOSIS — I701 Atherosclerosis of renal artery: Secondary | ICD-10-CM | POA: Diagnosis not present

## 2019-04-20 DIAGNOSIS — E889 Metabolic disorder, unspecified: Secondary | ICD-10-CM | POA: Diagnosis not present

## 2019-04-20 DIAGNOSIS — E559 Vitamin D deficiency, unspecified: Secondary | ICD-10-CM | POA: Diagnosis not present

## 2019-04-20 DIAGNOSIS — D631 Anemia in chronic kidney disease: Secondary | ICD-10-CM | POA: Diagnosis not present

## 2019-04-20 DIAGNOSIS — I129 Hypertensive chronic kidney disease with stage 1 through stage 4 chronic kidney disease, or unspecified chronic kidney disease: Secondary | ICD-10-CM | POA: Diagnosis not present

## 2019-04-20 DIAGNOSIS — M9080 Osteopathy in diseases classified elsewhere, unspecified site: Secondary | ICD-10-CM | POA: Diagnosis not present

## 2019-04-20 DIAGNOSIS — N184 Chronic kidney disease, stage 4 (severe): Secondary | ICD-10-CM | POA: Diagnosis not present

## 2019-04-20 DIAGNOSIS — E876 Hypokalemia: Secondary | ICD-10-CM | POA: Diagnosis not present

## 2019-04-26 DIAGNOSIS — I10 Essential (primary) hypertension: Secondary | ICD-10-CM | POA: Diagnosis not present

## 2019-04-26 DIAGNOSIS — E1165 Type 2 diabetes mellitus with hyperglycemia: Secondary | ICD-10-CM | POA: Diagnosis not present

## 2019-04-29 ENCOUNTER — Ambulatory Visit: Payer: PPO | Admitting: Cardiology

## 2019-05-04 ENCOUNTER — Ambulatory Visit (INDEPENDENT_AMBULATORY_CARE_PROVIDER_SITE_OTHER): Payer: PPO

## 2019-05-04 ENCOUNTER — Other Ambulatory Visit: Payer: Self-pay

## 2019-05-04 ENCOUNTER — Ambulatory Visit: Payer: PPO | Admitting: Internal Medicine

## 2019-05-04 ENCOUNTER — Encounter: Payer: Self-pay | Admitting: Internal Medicine

## 2019-05-04 VITALS — BP 138/68 | HR 74 | Temp 97.3°F | Ht 67.25 in | Wt 165.8 lb

## 2019-05-04 DIAGNOSIS — I700 Atherosclerosis of aorta: Secondary | ICD-10-CM | POA: Diagnosis not present

## 2019-05-04 DIAGNOSIS — J42 Unspecified chronic bronchitis: Secondary | ICD-10-CM | POA: Diagnosis not present

## 2019-05-04 DIAGNOSIS — J189 Pneumonia, unspecified organism: Secondary | ICD-10-CM

## 2019-05-04 DIAGNOSIS — Z23 Encounter for immunization: Secondary | ICD-10-CM | POA: Diagnosis not present

## 2019-05-04 DIAGNOSIS — J984 Other disorders of lung: Secondary | ICD-10-CM | POA: Diagnosis not present

## 2019-05-04 DIAGNOSIS — I509 Heart failure, unspecified: Secondary | ICD-10-CM

## 2019-05-04 DIAGNOSIS — G4733 Obstructive sleep apnea (adult) (pediatric): Secondary | ICD-10-CM | POA: Diagnosis not present

## 2019-05-04 NOTE — Progress Notes (Signed)
HPI female never smoker followed for OSA, complicated by CAD/MI/stent, asthma (Dr. Neldon Mc), DM 2, CKD4 NPSG 12/05/10([place]) AHI 14.2/ hr, weight 178 lbs.  PFT 11/07/14-  Moderate Diffusion deficit 60%. Normal flows and lung volumes with insignificant response to bronchodilator. a1AT- 10/07/14-  Nl MM 123 OK  --------------------------------------------------------------------------------------------   04/29/18- 78 year old female never smoker followed for OSA, complicated by CAD/MI/stent, asthma (Dr. Neldon Mc), DM 2, CKD4 CPAP auto 5-15/[facility] -----OSA: DME: [facility]. Pt wears CPAP nightly and unable to get DL at this time-pt did not bring Sd Card. Pt is having recurrent sinus infections.  Getting eye injections after hemorrhage.  Has been treated for sinus infection.  We discussed So Clean machine for CPAP care and relatively low incidence of sinus problems with CPAP. She is comfortable with her CPAP and reports using it all night every night.  Likes nasal pillows mask.  Sleeps better with it. Followed by her Allergist for asthma.  They had given sample Qvar 80 which helped while using it.  Sample ran out and her insurance does not cover that.  Says she cannot get anything more until her November appointment so I offered Flovent 110 which is covered by her plan.  05/04/2019- 78 year old female never smoker followed for OSA, complicated by CAD/MI/stent, Chronic Diastolic CHF, asthma (Dr. Neldon Mc), DM 2, HBP, CKD4 CPAP auto 5-15/[facility] -----OSA on CPAP, DME: [facility]; pt did not bring in SD card, no DL in [employer]; pt was recently hospitalized with pnuemonia for 10 days Hosp 3/31-12/03/18- CAP, microcytic anemia,  Says breathing ok now, no cough. Comfortable with her CPAP and has occasionally rested on it during day.  Cardiology tells her she is in chronic CHF. CXR 12/02/2018- IMPRESSION: Improving aeration of the right upper and mid lung with persisting opacity at the right lung base, potentially combination  of pleural fluid and consolidation/atelectasis. Blunting at the left costophrenic angle likely represents small pleural fluid and atelectasis  ROS see HPI    + = positive Constitutional:   No-   weight loss, night sweats, fevers, chills, fatigue, lassitude. HEENT:   No-  headaches, difficulty swallowing, tooth/dental problems, sore throat,       No-  sneezing, itching, ear ache, + nasal congestion, post nasal drip,  CV:  No-   chest pain, orthopnea, PND, + swelling in lower extremities, anasarca,                                           dizziness, + palpitations Resp: No-   shortness of breath with exertion or at rest.              No-   productive cough,  No non-productive cough,  No- coughing up of blood.              No-   change in color of mucus.  No- wheezing.   Skin: No-   rash or lesions. GI:  No-   heartburn, + indigestion, abdominal pain, nausea, vomiting,  GU:  MS:  + joint pain or swelling.   Neuro-     nothing unusual Psych:  No- change in mood or affect. No depression or anxiety.  No memory loss.  OBJ- Physical Exam General- Alert, Oriented, Affect-appropriate, Distress- none acute Skin- rash-none, lesions- none, excoriation- none Lymphadenopathy- none Head- atraumatic            Eyes- Gross vision intact,  PERRLA, conjunctivae and secretions clear            Ears- Hearing, canals-normal            Nose- Clear, no-Septal dev, mucus, polyps, erosion, perforation             Throat- Mallampati III , mucosa clear , drainage- none, tonsils- atrophic Neck- flexible , trachea midline, no stridor , thyroid nl, carotid no bruit Chest - symmetrical excursion , unlabored           Heart/CV- RRR , no murmur , no gallop  , no rub, nl s1 s2                           - JVD- none , edema- none, stasis changes- none, varices- none           Lung- clear to P&A, wheeze- none, cough- none , dullness-none, rub- none           Chest wall-  Abd-  Br/ Gen/ Rectal- Not done, not  indicated Extrem- cyanosis- none, clubbing, none, atrophy- none, strength- nl. Neuro- grossly intact to observation

## 2019-05-04 NOTE — Patient Instructions (Signed)
Order- flu vax- senior  Order- CXR   Dx community pneumonia, CHF  We can continue CPAP auto 5-15, mask of choice, humidifier, supplies, AirView   Please call if we can help

## 2019-05-04 NOTE — Assessment & Plan Note (Signed)
Clinically resolved Plan- CXR, Flu vax senior

## 2019-05-04 NOTE — Assessment & Plan Note (Addendum)
She continues to benefit with good compliance and control. Plan- continue CPAP auto 5-15

## 2019-05-05 ENCOUNTER — Telehealth: Payer: Self-pay | Admitting: Internal Medicine

## 2019-05-05 NOTE — Telephone Encounter (Signed)
Call returned to patient, confirmed DOB. Made aware of CXR results:  Notes recorded by Deneise Lever, MD on 05/04/2019 at 7:53 PM EDT   CXR- mild chronic bronchitis changes, but no pneumonia or heart failure evident now.   Voiced understanding. Nothing further needed at this time.

## 2019-05-12 DIAGNOSIS — Z1331 Encounter for screening for depression: Secondary | ICD-10-CM | POA: Diagnosis not present

## 2019-05-12 DIAGNOSIS — E1165 Type 2 diabetes mellitus with hyperglycemia: Secondary | ICD-10-CM | POA: Diagnosis not present

## 2019-05-12 DIAGNOSIS — Z23 Encounter for immunization: Secondary | ICD-10-CM | POA: Diagnosis not present

## 2019-05-12 DIAGNOSIS — Z2821 Immunization not carried out because of patient refusal: Secondary | ICD-10-CM | POA: Diagnosis not present

## 2019-05-12 DIAGNOSIS — Z6826 Body mass index (BMI) 26.0-26.9, adult: Secondary | ICD-10-CM | POA: Diagnosis not present

## 2019-05-17 ENCOUNTER — Encounter (INDEPENDENT_AMBULATORY_CARE_PROVIDER_SITE_OTHER): Payer: PPO | Admitting: Ophthalmology

## 2019-05-17 ENCOUNTER — Other Ambulatory Visit: Payer: Self-pay

## 2019-05-17 DIAGNOSIS — I10 Essential (primary) hypertension: Secondary | ICD-10-CM | POA: Diagnosis not present

## 2019-05-17 DIAGNOSIS — H43813 Vitreous degeneration, bilateral: Secondary | ICD-10-CM | POA: Diagnosis not present

## 2019-05-17 DIAGNOSIS — H34831 Tributary (branch) retinal vein occlusion, right eye, with macular edema: Secondary | ICD-10-CM

## 2019-05-17 DIAGNOSIS — H353122 Nonexudative age-related macular degeneration, left eye, intermediate dry stage: Secondary | ICD-10-CM

## 2019-05-17 DIAGNOSIS — H353221 Exudative age-related macular degeneration, left eye, with active choroidal neovascularization: Secondary | ICD-10-CM

## 2019-05-17 DIAGNOSIS — H35033 Hypertensive retinopathy, bilateral: Secondary | ICD-10-CM

## 2019-05-24 NOTE — Progress Notes (Signed)
Cardiology Office Note:    Date:  05/25/2019   ID:  [name], DOB 1941-06-18, MRN [id number]  PCP:  [name], MD  Cardiologist:  Shirlee More, MD    Referring MD: [name], MD    ASSESSMENT:    1. Coronary artery disease involving native coronary artery of native heart without angina pectoris   2. Hypertensive heart and kidney disease with chronic diastolic congestive heart failure and stage 4 chronic kidney disease ([place])   3. Anemia in stage 4 chronic kidney disease ([place])   4. Hyperlipidemia, unspecified hyperlipidemia type    PLAN:    In order of problems listed above:  1. Stable CAD New York Heart Association class I continue medical treatment aspirin beta-blocker lipid-lowering treatment along with oral nitrate calcium channel blocker 2. Stable hypertension BP at target continue multidrug regimen including clonidine and her current increased dose of diuretic rate stable CKD followed by nephrology 3. Improved hemoglobin near normal 11.2 4. Stable lipids at target with combined Zetia and statin   Next appointment: 6 months   Medication Adjustments/Labs and Tests Ordered: Current medicines are reviewed at length with the patient today.  Concerns regarding medicines are outlined above.  Orders Placed This Encounter  Procedures  . EKG 12-Lead   No orders of the defined types were placed in this encounter.   Chief Complaint  Patient presents with  . Follow-up  . Coronary Artery Disease  . Congestive Heart Failure  . Chronic Kidney Disease  . Hypertension    History of Present Illness:    Leah Walsh is a 78 y.o. female with a hx of CAD previous PCI and stent LAD, CKD stage IV, renal artery stenosis  and hypertension followed by [facility] nephrology.She was admitted to [facility] with pneumonia 11/23/18 to  12/03/18.  Her case was complicated by a low level troponin elevation not felt to reflect acute coronary syndrome, acute on chronic stage IV  CKD, severe anemia hemoglobin 6.9 requiring transfusion and  volume overload with a net gain of 3 kg and acute decompensated heart failure responding to IV diuretics   She was last seen 02/18/2019. Compliance with diet, lifestyle and medications: Yes  Nephrologist increased her torsemide to 3 times daily and edema has diminished.  She is not short of breath orthopnea chest pain palpitation or syncope.  Her hemoglobin is markedly improved renal function is stable.  Had no anginal discomfort.  Lipid profile in June at target LDL Past Medical History:  Diagnosis Date  . Asthma   . Diabetes ([place])   . Heart attack ([place]) 2014  . Hyperlipidemia   . Hypertension   . Kidney disease   . Sleep apnea    Using CPAP    Past Surgical History:  Procedure Laterality Date  . ABDOMINAL HYSTERECTOMY    . CAROTID STENT    . CHOLECYSTECTOMY    . TUBAL LIGATION    . 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 VESICOVAGINAL FISTULA CLOSURE W/ TAH      Current Medications: Current Meds  Medication Sig  . acetaminophen (TYLENOL) 500 MG tablet Take 1,000 mg by mouth every 8 (eight) hours as needed (pain / fever).  [name] albuterol (PROVENTIL HFA) 108 (90 Base) MCG/ACT inhaler Inhale two puffs every four to six hours as needed for cough or wheeze  . allopurinol (ZYLOPRIM) 100 MG tablet Take 100 mg by mouth daily.  [name] aspirin 81 MG tablet Take 81 mg  by mouth at bedtime.   [name] atorvastatin (LIPITOR) 80 MG tablet TAKE 1 TABLET (80 MG TOTAL) BY MOUTH AT BEDTIME.  [name] B-D UF III MINI PEN NEEDLES 31G X 5 MM MISC   . beclomethasone (QVAR) 80 MCG/ACT inhaler Inhale 2 puffs into the lungs 2 (two) times daily.  . carvedilol (COREG) 25 MG tablet TAKE 1 TABLET (25 MG TOTAL) BY MOUTH 2 (TWO) TIMES DAILY WITH A MEAL.  [name] Cholecalciferol (VITAMIN D) 2000 UNITS CAPS Take 1 capsule by mouth daily.  . cloNIDine (CATAPRES) 0.1 MG tablet TAKE 1 TABLET (0.1 MG TOTAL) BY MOUTH 3 (THREE) TIMES DAILY.  [name] Coenzyme Q10 (COQ10) 200 MG CAPS  Take 1 capsule by mouth daily.  . cycloSPORINE (RESTASIS) 0.05 % ophthalmic emulsion Place 1 drop into both eyes 2 (two) times daily.  [name] ezetimibe (ZETIA) 10 MG tablet TAKE 1 TABLET (10 MG TOTAL) BY MOUTH DAILY.  [name] Insulin Glargine (LANTUS SOLOSTAR) 100 UNIT/ML Solostar Pen Inject 13 Units into the skin 2 (two) times daily. (Patient taking differently: Inject into the skin as directed. 40 units in the morning and 20 units in the evening)  . isosorbide mononitrate (IMDUR) 120 MG 24 hr tablet TAKE 1 TABLET (120 MG TOTAL) BY MOUTH DAILY  . montelukast (SINGULAIR) 10 MG tablet Take 1 tablet (10 mg total) by mouth daily.  . Multiple Vitamin (MULTIVITAMIN) tablet Take 1 tablet by mouth daily.  . Multiple Vitamins-Minerals (PRESERVISION AREDS 2 PO) Take 1 capsule by mouth 2 (two) times daily.   [name] NIFEdipine (ADALAT CC) 90 MG 24 hr tablet TAKE 1 TABLET (90 MG TOTAL) BY MOUTH DAILY.  . nitroGLYCERIN (NITROSTAT) 0.4 MG SL tablet Place 1 tablet (0.4 mg total) under the tongue every 5 (five) minutes as needed for chest pain.  [name] NOVOLOG FLEXPEN 100 UNIT/ML FlexPen Inject 2 Units into the skin daily before lunch. (Patient taking differently: Inject 16 Units into the skin 3 (three) times daily. )  . omeprazole (PRILOSEC) 20 MG capsule Take 20 mg by mouth daily.   . potassium chloride (KLOR-CON) 10 MEQ tablet Take 10 mEq by mouth daily.  . sertraline (ZOLOFT) 25 MG tablet Take 25 mg by mouth daily.  . sodium chloride (OCEAN) 0.65 % SOLN nasal spray Place 1 spray into both nostrils at bedtime.  . torsemide (DEMADEX) 20 MG tablet Take 20 mg by mouth as directed. Take 2 tablets in the morning and 1 tablet in the afternoon     Allergies:   Compazine [prochlorperazine edisylate], Colcrys [colchicine], Hydrocodone, and Codeine   Social History   Socioeconomic History  . Marital status: Married    Spouse name: Not on file  . Number of children: Not on file  . Years of education: Not on file  . Highest education  level: Not on file  Occupational History  . Occupation: retired  Scientific laboratory technician  . Financial resource strain: Not on file  . Food insecurity    Worry: Not on file    Inability: Not on file  . Transportation needs    Medical: Not on file    Non-medical: Not on file  Tobacco Use  . Smoking status: Never Smoker  . Smokeless tobacco: Never Used  Substance and Sexual Activity  . Alcohol use: No    Alcohol/week: 0.0 standard drinks  . Drug use: No  . Sexual activity: Not on file  Lifestyle  . Physical activity    Days per week: Not on file    Minutes  per session: Not on file  . Stress: Not on file  Relationships  . Social Herbalist on phone: Not on file    Gets together: Not on file    Attends religious service: Not on file    Active member of club or organization: Not on file    Attends meetings of clubs or organizations: Not on file    Relationship status: Not on file  Other Topics Concern  . Not on file  Social History Narrative  . Not on file     Family History: The patient's family history includes Diabetes in her sister and sister; Heart disease in her father, maternal grandfather, maternal grandmother, mother, paternal grandfather, and paternal grandmother; Lung cancer in her brother. ROS:   Please see the history of present illness.    All other systems reviewed and are negative.  EKGs/Labs/Other Studies Reviewed:    The following studies were reviewed today:  EKG:  EKG ordered today and personally reviewed.  The ekg ordered today demonstrates sinus rhythm and is normal  Recent Labs: 04/15/2019: K 4.1 Cr 2.0 GFR 24 cc/min Hgb 11.2 11/27/2018: B Natriuretic Peptide 427.5 11/29/2018: ALT 21 12/02/2018: Hemoglobin 8.3; Platelets 272 12/03/2018: BUN 43; Creatinine, Ser 2.90; Potassium 3.7; Sodium 135  Recent Lipid Panel No results found for: CHOL, TRIG, HDL, CHOLHDL, VLDL, LDLCALC, LDLDIRECT  Physical Exam:    VS:  BP 126/60 (BP Location: Right Arm, Patient  Position: Sitting, Cuff Size: Normal)   Pulse 72   Temp (!) 96.1 F (35.6 C)   Ht 5' 7.25" (1.708 m)   Wt 169 lb (76.7 kg)   BMI 26.27 kg/m     Wt Readings from Last 3 Encounters:  05/25/19 169 lb (76.7 kg)  05/04/19 165 lb 12.8 oz (75.2 kg)  02/18/19 167 lb (75.8 kg)     GEN:  Well nourished, well developed in no acute distress HEENT: Normal NECK: No JVD; No carotid bruits LYMPHATICS: No lymphadenopathy CARDIAC: RRR, no murmurs, rubs, gallops RESPIRATORY:  Clear to auscultation without rales, wheezing or rhonchi  ABDOMEN: Soft, non-tender, non-distended MUSCULOSKELETAL: She has mild dependent edema over the dorsum of her feet and ankle bilateral edema; No deformity  SKIN: Warm and dry NEUROLOGIC:  Alert and oriented x 3 PSYCHIATRIC:  Normal affect    Signed, Shirlee More, MD  05/25/2019 3:07 PM    [facility]

## 2019-05-25 ENCOUNTER — Encounter: Payer: Self-pay | Admitting: Cardiology

## 2019-05-25 ENCOUNTER — Ambulatory Visit (INDEPENDENT_AMBULATORY_CARE_PROVIDER_SITE_OTHER): Payer: PPO | Admitting: Cardiology

## 2019-05-25 ENCOUNTER — Other Ambulatory Visit: Payer: Self-pay

## 2019-05-25 VITALS — BP 126/60 | HR 72 | Temp 96.1°F | Ht 67.25 in | Wt 169.0 lb

## 2019-05-25 DIAGNOSIS — N184 Chronic kidney disease, stage 4 (severe): Secondary | ICD-10-CM | POA: Diagnosis not present

## 2019-05-25 DIAGNOSIS — I130 Hypertensive heart and chronic kidney disease with heart failure and stage 1 through stage 4 chronic kidney disease, or unspecified chronic kidney disease: Secondary | ICD-10-CM | POA: Diagnosis not present

## 2019-05-25 DIAGNOSIS — I129 Hypertensive chronic kidney disease with stage 1 through stage 4 chronic kidney disease, or unspecified chronic kidney disease: Secondary | ICD-10-CM | POA: Diagnosis not present

## 2019-05-25 DIAGNOSIS — I2510 Atherosclerotic heart disease of native coronary artery without angina pectoris: Secondary | ICD-10-CM | POA: Diagnosis not present

## 2019-05-25 DIAGNOSIS — I5032 Chronic diastolic (congestive) heart failure: Secondary | ICD-10-CM | POA: Diagnosis not present

## 2019-05-25 DIAGNOSIS — D631 Anemia in chronic kidney disease: Secondary | ICD-10-CM

## 2019-05-25 DIAGNOSIS — E785 Hyperlipidemia, unspecified: Secondary | ICD-10-CM | POA: Diagnosis not present

## 2019-05-25 NOTE — Patient Instructions (Signed)

## 2019-05-26 DIAGNOSIS — E1165 Type 2 diabetes mellitus with hyperglycemia: Secondary | ICD-10-CM | POA: Diagnosis not present

## 2019-05-26 DIAGNOSIS — I10 Essential (primary) hypertension: Secondary | ICD-10-CM | POA: Diagnosis not present

## 2019-05-26 DIAGNOSIS — E785 Hyperlipidemia, unspecified: Secondary | ICD-10-CM | POA: Diagnosis not present

## 2019-06-01 DIAGNOSIS — D631 Anemia in chronic kidney disease: Secondary | ICD-10-CM | POA: Diagnosis not present

## 2019-06-01 DIAGNOSIS — M9080 Osteopathy in diseases classified elsewhere, unspecified site: Secondary | ICD-10-CM | POA: Diagnosis not present

## 2019-06-01 DIAGNOSIS — I701 Atherosclerosis of renal artery: Secondary | ICD-10-CM | POA: Diagnosis not present

## 2019-06-01 DIAGNOSIS — N184 Chronic kidney disease, stage 4 (severe): Secondary | ICD-10-CM | POA: Diagnosis not present

## 2019-06-01 DIAGNOSIS — E559 Vitamin D deficiency, unspecified: Secondary | ICD-10-CM | POA: Diagnosis not present

## 2019-06-01 DIAGNOSIS — I129 Hypertensive chronic kidney disease with stage 1 through stage 4 chronic kidney disease, or unspecified chronic kidney disease: Secondary | ICD-10-CM | POA: Diagnosis not present

## 2019-06-01 DIAGNOSIS — E889 Metabolic disorder, unspecified: Secondary | ICD-10-CM | POA: Diagnosis not present

## 2019-06-15 DIAGNOSIS — Z03818 Encounter for observation for suspected exposure to other biological agents ruled out: Secondary | ICD-10-CM | POA: Diagnosis not present

## 2019-06-15 DIAGNOSIS — R0902 Hypoxemia: Secondary | ICD-10-CM | POA: Diagnosis not present

## 2019-06-15 DIAGNOSIS — B349 Viral infection, unspecified: Secondary | ICD-10-CM | POA: Diagnosis not present

## 2019-06-15 DIAGNOSIS — I10 Essential (primary) hypertension: Secondary | ICD-10-CM | POA: Diagnosis not present

## 2019-06-15 DIAGNOSIS — R069 Unspecified abnormalities of breathing: Secondary | ICD-10-CM | POA: Diagnosis not present

## 2019-06-15 DIAGNOSIS — R509 Fever, unspecified: Secondary | ICD-10-CM | POA: Diagnosis not present

## 2019-06-15 DIAGNOSIS — R0602 Shortness of breath: Secondary | ICD-10-CM | POA: Diagnosis not present

## 2019-06-17 DIAGNOSIS — K219 Gastro-esophageal reflux disease without esophagitis: Secondary | ICD-10-CM | POA: Diagnosis not present

## 2019-06-17 DIAGNOSIS — I10 Essential (primary) hypertension: Secondary | ICD-10-CM | POA: Diagnosis not present

## 2019-06-17 DIAGNOSIS — J449 Chronic obstructive pulmonary disease, unspecified: Secondary | ICD-10-CM | POA: Diagnosis not present

## 2019-06-17 DIAGNOSIS — Z888 Allergy status to other drugs, medicaments and biological substances status: Secondary | ICD-10-CM | POA: Diagnosis not present

## 2019-06-17 DIAGNOSIS — R0602 Shortness of breath: Secondary | ICD-10-CM | POA: Diagnosis not present

## 2019-06-17 DIAGNOSIS — I252 Old myocardial infarction: Secondary | ICD-10-CM | POA: Diagnosis not present

## 2019-06-17 DIAGNOSIS — Z8673 Personal history of transient ischemic attack (TIA), and cerebral infarction without residual deficits: Secondary | ICD-10-CM | POA: Diagnosis not present

## 2019-06-17 DIAGNOSIS — R197 Diarrhea, unspecified: Secondary | ICD-10-CM | POA: Diagnosis not present

## 2019-06-17 DIAGNOSIS — Z885 Allergy status to narcotic agent status: Secondary | ICD-10-CM | POA: Diagnosis not present

## 2019-06-17 DIAGNOSIS — R918 Other nonspecific abnormal finding of lung field: Secondary | ICD-10-CM | POA: Diagnosis not present

## 2019-06-17 DIAGNOSIS — Z209 Contact with and (suspected) exposure to unspecified communicable disease: Secondary | ICD-10-CM | POA: Diagnosis not present

## 2019-06-17 DIAGNOSIS — E119 Type 2 diabetes mellitus without complications: Secondary | ICD-10-CM | POA: Diagnosis not present

## 2019-06-17 DIAGNOSIS — I130 Hypertensive heart and chronic kidney disease with heart failure and stage 1 through stage 4 chronic kidney disease, or unspecified chronic kidney disease: Secondary | ICD-10-CM | POA: Diagnosis not present

## 2019-06-17 DIAGNOSIS — E1122 Type 2 diabetes mellitus with diabetic chronic kidney disease: Secondary | ICD-10-CM | POA: Diagnosis not present

## 2019-06-17 DIAGNOSIS — N184 Chronic kidney disease, stage 4 (severe): Secondary | ICD-10-CM | POA: Diagnosis not present

## 2019-06-17 DIAGNOSIS — F419 Anxiety disorder, unspecified: Secondary | ICD-10-CM | POA: Diagnosis not present

## 2019-06-17 DIAGNOSIS — I5033 Acute on chronic diastolic (congestive) heart failure: Secondary | ICD-10-CM | POA: Diagnosis not present

## 2019-06-17 DIAGNOSIS — E785 Hyperlipidemia, unspecified: Secondary | ICD-10-CM | POA: Diagnosis not present

## 2019-06-17 DIAGNOSIS — R05 Cough: Secondary | ICD-10-CM | POA: Diagnosis not present

## 2019-06-17 DIAGNOSIS — I959 Hypotension, unspecified: Secondary | ICD-10-CM | POA: Diagnosis not present

## 2019-06-17 DIAGNOSIS — Z7982 Long term (current) use of aspirin: Secondary | ICD-10-CM | POA: Diagnosis not present

## 2019-06-17 DIAGNOSIS — Z79899 Other long term (current) drug therapy: Secondary | ICD-10-CM | POA: Diagnosis not present

## 2019-06-17 DIAGNOSIS — R739 Hyperglycemia, unspecified: Secondary | ICD-10-CM | POA: Diagnosis not present

## 2019-06-17 DIAGNOSIS — J80 Acute respiratory distress syndrome: Secondary | ICD-10-CM | POA: Diagnosis not present

## 2019-06-17 DIAGNOSIS — R911 Solitary pulmonary nodule: Secondary | ICD-10-CM | POA: Diagnosis not present

## 2019-06-17 DIAGNOSIS — F329 Major depressive disorder, single episode, unspecified: Secondary | ICD-10-CM | POA: Diagnosis not present

## 2019-06-17 DIAGNOSIS — M109 Gout, unspecified: Secondary | ICD-10-CM | POA: Diagnosis not present

## 2019-06-17 DIAGNOSIS — R7881 Bacteremia: Secondary | ICD-10-CM | POA: Diagnosis not present

## 2019-06-17 DIAGNOSIS — Z794 Long term (current) use of insulin: Secondary | ICD-10-CM | POA: Diagnosis not present

## 2019-06-17 DIAGNOSIS — E1165 Type 2 diabetes mellitus with hyperglycemia: Secondary | ICD-10-CM | POA: Diagnosis not present

## 2019-06-17 DIAGNOSIS — Z955 Presence of coronary angioplasty implant and graft: Secondary | ICD-10-CM | POA: Diagnosis not present

## 2019-06-17 DIAGNOSIS — Z03818 Encounter for observation for suspected exposure to other biological agents ruled out: Secondary | ICD-10-CM | POA: Diagnosis not present

## 2019-06-17 DIAGNOSIS — I2510 Atherosclerotic heart disease of native coronary artery without angina pectoris: Secondary | ICD-10-CM | POA: Diagnosis not present

## 2019-06-17 DIAGNOSIS — R0902 Hypoxemia: Secondary | ICD-10-CM | POA: Diagnosis not present

## 2019-06-17 DIAGNOSIS — J189 Pneumonia, unspecified organism: Secondary | ICD-10-CM | POA: Diagnosis not present

## 2019-06-17 DIAGNOSIS — I509 Heart failure, unspecified: Secondary | ICD-10-CM | POA: Diagnosis not present

## 2019-06-17 DIAGNOSIS — M1990 Unspecified osteoarthritis, unspecified site: Secondary | ICD-10-CM | POA: Diagnosis not present

## 2019-06-17 DIAGNOSIS — I361 Nonrheumatic tricuspid (valve) insufficiency: Secondary | ICD-10-CM | POA: Diagnosis not present

## 2019-06-17 DIAGNOSIS — J90 Pleural effusion, not elsewhere classified: Secondary | ICD-10-CM | POA: Diagnosis not present

## 2019-06-17 DIAGNOSIS — I340 Nonrheumatic mitral (valve) insufficiency: Secondary | ICD-10-CM | POA: Diagnosis not present

## 2019-06-21 ENCOUNTER — Encounter (INDEPENDENT_AMBULATORY_CARE_PROVIDER_SITE_OTHER): Payer: PPO | Admitting: Ophthalmology

## 2019-06-25 DIAGNOSIS — I10 Essential (primary) hypertension: Secondary | ICD-10-CM | POA: Diagnosis not present

## 2019-06-25 DIAGNOSIS — E1165 Type 2 diabetes mellitus with hyperglycemia: Secondary | ICD-10-CM | POA: Diagnosis not present

## 2019-06-25 DIAGNOSIS — E785 Hyperlipidemia, unspecified: Secondary | ICD-10-CM | POA: Diagnosis not present

## 2019-06-27 DIAGNOSIS — Z7902 Long term (current) use of antithrombotics/antiplatelets: Secondary | ICD-10-CM | POA: Diagnosis not present

## 2019-06-27 DIAGNOSIS — Z794 Long term (current) use of insulin: Secondary | ICD-10-CM | POA: Diagnosis not present

## 2019-06-27 DIAGNOSIS — F419 Anxiety disorder, unspecified: Secondary | ICD-10-CM | POA: Diagnosis not present

## 2019-06-27 DIAGNOSIS — Z95828 Presence of other vascular implants and grafts: Secondary | ICD-10-CM | POA: Diagnosis not present

## 2019-06-27 DIAGNOSIS — M1990 Unspecified osteoarthritis, unspecified site: Secondary | ICD-10-CM | POA: Diagnosis not present

## 2019-06-27 DIAGNOSIS — Z90722 Acquired absence of ovaries, bilateral: Secondary | ICD-10-CM | POA: Diagnosis not present

## 2019-06-27 DIAGNOSIS — I130 Hypertensive heart and chronic kidney disease with heart failure and stage 1 through stage 4 chronic kidney disease, or unspecified chronic kidney disease: Secondary | ICD-10-CM | POA: Diagnosis not present

## 2019-06-27 DIAGNOSIS — I2510 Atherosclerotic heart disease of native coronary artery without angina pectoris: Secondary | ICD-10-CM | POA: Diagnosis not present

## 2019-06-27 DIAGNOSIS — J449 Chronic obstructive pulmonary disease, unspecified: Secondary | ICD-10-CM | POA: Diagnosis not present

## 2019-06-27 DIAGNOSIS — Z955 Presence of coronary angioplasty implant and graft: Secondary | ICD-10-CM | POA: Diagnosis not present

## 2019-06-27 DIAGNOSIS — I5032 Chronic diastolic (congestive) heart failure: Secondary | ICD-10-CM | POA: Diagnosis not present

## 2019-06-27 DIAGNOSIS — F329 Major depressive disorder, single episode, unspecified: Secondary | ICD-10-CM | POA: Diagnosis not present

## 2019-06-27 DIAGNOSIS — K219 Gastro-esophageal reflux disease without esophagitis: Secondary | ICD-10-CM | POA: Diagnosis not present

## 2019-06-27 DIAGNOSIS — M109 Gout, unspecified: Secondary | ICD-10-CM | POA: Diagnosis not present

## 2019-06-27 DIAGNOSIS — E785 Hyperlipidemia, unspecified: Secondary | ICD-10-CM | POA: Diagnosis not present

## 2019-06-27 DIAGNOSIS — G4733 Obstructive sleep apnea (adult) (pediatric): Secondary | ICD-10-CM | POA: Diagnosis not present

## 2019-06-27 DIAGNOSIS — E1122 Type 2 diabetes mellitus with diabetic chronic kidney disease: Secondary | ICD-10-CM | POA: Diagnosis not present

## 2019-06-27 DIAGNOSIS — R911 Solitary pulmonary nodule: Secondary | ICD-10-CM | POA: Diagnosis not present

## 2019-06-27 DIAGNOSIS — Z7982 Long term (current) use of aspirin: Secondary | ICD-10-CM | POA: Diagnosis not present

## 2019-06-27 DIAGNOSIS — I252 Old myocardial infarction: Secondary | ICD-10-CM | POA: Diagnosis not present

## 2019-06-27 DIAGNOSIS — Z9181 History of falling: Secondary | ICD-10-CM | POA: Diagnosis not present

## 2019-06-27 DIAGNOSIS — N184 Chronic kidney disease, stage 4 (severe): Secondary | ICD-10-CM | POA: Diagnosis not present

## 2019-06-27 DIAGNOSIS — Z8673 Personal history of transient ischemic attack (TIA), and cerebral infarction without residual deficits: Secondary | ICD-10-CM | POA: Diagnosis not present

## 2019-06-29 DIAGNOSIS — E889 Metabolic disorder, unspecified: Secondary | ICD-10-CM | POA: Diagnosis not present

## 2019-06-29 DIAGNOSIS — I129 Hypertensive chronic kidney disease with stage 1 through stage 4 chronic kidney disease, or unspecified chronic kidney disease: Secondary | ICD-10-CM | POA: Diagnosis not present

## 2019-06-29 DIAGNOSIS — E559 Vitamin D deficiency, unspecified: Secondary | ICD-10-CM | POA: Diagnosis not present

## 2019-06-29 DIAGNOSIS — D631 Anemia in chronic kidney disease: Secondary | ICD-10-CM | POA: Diagnosis not present

## 2019-06-29 DIAGNOSIS — N179 Acute kidney failure, unspecified: Secondary | ICD-10-CM | POA: Diagnosis not present

## 2019-06-29 DIAGNOSIS — N184 Chronic kidney disease, stage 4 (severe): Secondary | ICD-10-CM | POA: Diagnosis not present

## 2019-06-29 DIAGNOSIS — M9080 Osteopathy in diseases classified elsewhere, unspecified site: Secondary | ICD-10-CM | POA: Diagnosis not present

## 2019-06-30 DIAGNOSIS — Z6826 Body mass index (BMI) 26.0-26.9, adult: Secondary | ICD-10-CM | POA: Diagnosis not present

## 2019-06-30 DIAGNOSIS — Z79899 Other long term (current) drug therapy: Secondary | ICD-10-CM | POA: Diagnosis not present

## 2019-06-30 DIAGNOSIS — I509 Heart failure, unspecified: Secondary | ICD-10-CM | POA: Diagnosis not present

## 2019-06-30 DIAGNOSIS — Z1331 Encounter for screening for depression: Secondary | ICD-10-CM | POA: Diagnosis not present

## 2019-06-30 DIAGNOSIS — Z09 Encounter for follow-up examination after completed treatment for conditions other than malignant neoplasm: Secondary | ICD-10-CM | POA: Diagnosis not present

## 2019-07-05 DIAGNOSIS — I130 Hypertensive heart and chronic kidney disease with heart failure and stage 1 through stage 4 chronic kidney disease, or unspecified chronic kidney disease: Secondary | ICD-10-CM | POA: Diagnosis not present

## 2019-07-13 DIAGNOSIS — Z20828 Contact with and (suspected) exposure to other viral communicable diseases: Secondary | ICD-10-CM | POA: Diagnosis not present

## 2019-07-13 DIAGNOSIS — R509 Fever, unspecified: Secondary | ICD-10-CM | POA: Diagnosis not present

## 2019-07-26 DIAGNOSIS — I10 Essential (primary) hypertension: Secondary | ICD-10-CM | POA: Diagnosis not present

## 2019-07-26 DIAGNOSIS — E785 Hyperlipidemia, unspecified: Secondary | ICD-10-CM | POA: Diagnosis not present

## 2019-07-26 DIAGNOSIS — I130 Hypertensive heart and chronic kidney disease with heart failure and stage 1 through stage 4 chronic kidney disease, or unspecified chronic kidney disease: Secondary | ICD-10-CM | POA: Diagnosis not present

## 2019-07-27 DIAGNOSIS — Z7902 Long term (current) use of antithrombotics/antiplatelets: Secondary | ICD-10-CM | POA: Diagnosis not present

## 2019-07-27 DIAGNOSIS — E785 Hyperlipidemia, unspecified: Secondary | ICD-10-CM | POA: Diagnosis not present

## 2019-07-27 DIAGNOSIS — E1122 Type 2 diabetes mellitus with diabetic chronic kidney disease: Secondary | ICD-10-CM | POA: Diagnosis not present

## 2019-07-27 DIAGNOSIS — Z90722 Acquired absence of ovaries, bilateral: Secondary | ICD-10-CM | POA: Diagnosis not present

## 2019-07-27 DIAGNOSIS — M1990 Unspecified osteoarthritis, unspecified site: Secondary | ICD-10-CM | POA: Diagnosis not present

## 2019-07-27 DIAGNOSIS — Z95828 Presence of other vascular implants and grafts: Secondary | ICD-10-CM | POA: Diagnosis not present

## 2019-07-27 DIAGNOSIS — I5032 Chronic diastolic (congestive) heart failure: Secondary | ICD-10-CM | POA: Diagnosis not present

## 2019-07-27 DIAGNOSIS — J449 Chronic obstructive pulmonary disease, unspecified: Secondary | ICD-10-CM | POA: Diagnosis not present

## 2019-07-27 DIAGNOSIS — Z7982 Long term (current) use of aspirin: Secondary | ICD-10-CM | POA: Diagnosis not present

## 2019-07-27 DIAGNOSIS — R911 Solitary pulmonary nodule: Secondary | ICD-10-CM | POA: Diagnosis not present

## 2019-07-27 DIAGNOSIS — M109 Gout, unspecified: Secondary | ICD-10-CM | POA: Diagnosis not present

## 2019-07-27 DIAGNOSIS — K219 Gastro-esophageal reflux disease without esophagitis: Secondary | ICD-10-CM | POA: Diagnosis not present

## 2019-07-27 DIAGNOSIS — I130 Hypertensive heart and chronic kidney disease with heart failure and stage 1 through stage 4 chronic kidney disease, or unspecified chronic kidney disease: Secondary | ICD-10-CM | POA: Diagnosis not present

## 2019-07-27 DIAGNOSIS — F419 Anxiety disorder, unspecified: Secondary | ICD-10-CM | POA: Diagnosis not present

## 2019-07-27 DIAGNOSIS — Z955 Presence of coronary angioplasty implant and graft: Secondary | ICD-10-CM | POA: Diagnosis not present

## 2019-07-27 DIAGNOSIS — I2510 Atherosclerotic heart disease of native coronary artery without angina pectoris: Secondary | ICD-10-CM | POA: Diagnosis not present

## 2019-07-27 DIAGNOSIS — N184 Chronic kidney disease, stage 4 (severe): Secondary | ICD-10-CM | POA: Diagnosis not present

## 2019-07-27 DIAGNOSIS — Z8673 Personal history of transient ischemic attack (TIA), and cerebral infarction without residual deficits: Secondary | ICD-10-CM | POA: Diagnosis not present

## 2019-07-27 DIAGNOSIS — F329 Major depressive disorder, single episode, unspecified: Secondary | ICD-10-CM | POA: Diagnosis not present

## 2019-07-27 DIAGNOSIS — I252 Old myocardial infarction: Secondary | ICD-10-CM | POA: Diagnosis not present

## 2019-07-27 DIAGNOSIS — Z9181 History of falling: Secondary | ICD-10-CM | POA: Diagnosis not present

## 2019-07-27 DIAGNOSIS — Z794 Long term (current) use of insulin: Secondary | ICD-10-CM | POA: Diagnosis not present

## 2019-07-27 DIAGNOSIS — G4733 Obstructive sleep apnea (adult) (pediatric): Secondary | ICD-10-CM | POA: Diagnosis not present

## 2019-08-10 DIAGNOSIS — N184 Chronic kidney disease, stage 4 (severe): Secondary | ICD-10-CM | POA: Diagnosis not present

## 2019-08-16 DIAGNOSIS — I701 Atherosclerosis of renal artery: Secondary | ICD-10-CM | POA: Diagnosis not present

## 2019-08-16 DIAGNOSIS — M9080 Osteopathy in diseases classified elsewhere, unspecified site: Secondary | ICD-10-CM | POA: Diagnosis not present

## 2019-08-16 DIAGNOSIS — N184 Chronic kidney disease, stage 4 (severe): Secondary | ICD-10-CM | POA: Diagnosis not present

## 2019-08-16 DIAGNOSIS — E889 Metabolic disorder, unspecified: Secondary | ICD-10-CM | POA: Diagnosis not present

## 2019-08-16 DIAGNOSIS — E559 Vitamin D deficiency, unspecified: Secondary | ICD-10-CM | POA: Diagnosis not present

## 2019-08-16 DIAGNOSIS — D631 Anemia in chronic kidney disease: Secondary | ICD-10-CM | POA: Diagnosis not present

## 2019-08-16 DIAGNOSIS — E278 Other specified disorders of adrenal gland: Secondary | ICD-10-CM | POA: Diagnosis not present

## 2019-08-16 DIAGNOSIS — I129 Hypertensive chronic kidney disease with stage 1 through stage 4 chronic kidney disease, or unspecified chronic kidney disease: Secondary | ICD-10-CM | POA: Diagnosis not present

## 2019-08-26 DIAGNOSIS — E785 Hyperlipidemia, unspecified: Secondary | ICD-10-CM | POA: Diagnosis not present

## 2019-08-26 DIAGNOSIS — I10 Essential (primary) hypertension: Secondary | ICD-10-CM | POA: Diagnosis not present

## 2019-09-08 DIAGNOSIS — H353132 Nonexudative age-related macular degeneration, bilateral, intermediate dry stage: Secondary | ICD-10-CM | POA: Diagnosis not present

## 2019-09-08 DIAGNOSIS — H40013 Open angle with borderline findings, low risk, bilateral: Secondary | ICD-10-CM | POA: Diagnosis not present

## 2019-09-08 DIAGNOSIS — H16223 Keratoconjunctivitis sicca, not specified as Sjogren's, bilateral: Secondary | ICD-10-CM | POA: Diagnosis not present

## 2019-09-08 DIAGNOSIS — H04123 Dry eye syndrome of bilateral lacrimal glands: Secondary | ICD-10-CM | POA: Diagnosis not present

## 2019-09-13 ENCOUNTER — Encounter: Payer: Self-pay | Admitting: Internal Medicine

## 2019-09-20 ENCOUNTER — Encounter (INDEPENDENT_AMBULATORY_CARE_PROVIDER_SITE_OTHER): Payer: PPO | Admitting: Ophthalmology

## 2019-09-20 ENCOUNTER — Other Ambulatory Visit: Payer: Self-pay

## 2019-09-20 DIAGNOSIS — H43813 Vitreous degeneration, bilateral: Secondary | ICD-10-CM | POA: Diagnosis not present

## 2019-09-20 DIAGNOSIS — H353221 Exudative age-related macular degeneration, left eye, with active choroidal neovascularization: Secondary | ICD-10-CM

## 2019-09-20 DIAGNOSIS — H35033 Hypertensive retinopathy, bilateral: Secondary | ICD-10-CM | POA: Diagnosis not present

## 2019-09-20 DIAGNOSIS — H353112 Nonexudative age-related macular degeneration, right eye, intermediate dry stage: Secondary | ICD-10-CM | POA: Diagnosis not present

## 2019-09-20 DIAGNOSIS — H34831 Tributary (branch) retinal vein occlusion, right eye, with macular edema: Secondary | ICD-10-CM

## 2019-09-20 DIAGNOSIS — I10 Essential (primary) hypertension: Secondary | ICD-10-CM | POA: Diagnosis not present

## 2019-09-24 DIAGNOSIS — E1165 Type 2 diabetes mellitus with hyperglycemia: Secondary | ICD-10-CM | POA: Diagnosis not present

## 2019-09-25 DIAGNOSIS — I10 Essential (primary) hypertension: Secondary | ICD-10-CM | POA: Diagnosis not present

## 2019-09-25 DIAGNOSIS — E7800 Pure hypercholesterolemia, unspecified: Secondary | ICD-10-CM | POA: Diagnosis not present

## 2019-09-25 DIAGNOSIS — E1165 Type 2 diabetes mellitus with hyperglycemia: Secondary | ICD-10-CM | POA: Diagnosis not present

## 2019-10-24 DIAGNOSIS — E1165 Type 2 diabetes mellitus with hyperglycemia: Secondary | ICD-10-CM | POA: Diagnosis not present

## 2019-10-24 DIAGNOSIS — J45909 Unspecified asthma, uncomplicated: Secondary | ICD-10-CM | POA: Diagnosis not present

## 2019-10-24 DIAGNOSIS — E7800 Pure hypercholesterolemia, unspecified: Secondary | ICD-10-CM | POA: Diagnosis not present

## 2019-10-25 ENCOUNTER — Encounter (INDEPENDENT_AMBULATORY_CARE_PROVIDER_SITE_OTHER): Payer: PPO | Admitting: Ophthalmology

## 2019-10-25 ENCOUNTER — Other Ambulatory Visit: Payer: Self-pay

## 2019-10-25 DIAGNOSIS — H353132 Nonexudative age-related macular degeneration, bilateral, intermediate dry stage: Secondary | ICD-10-CM

## 2019-10-25 DIAGNOSIS — H35033 Hypertensive retinopathy, bilateral: Secondary | ICD-10-CM | POA: Diagnosis not present

## 2019-10-25 DIAGNOSIS — I10 Essential (primary) hypertension: Secondary | ICD-10-CM | POA: Diagnosis not present

## 2019-10-25 DIAGNOSIS — H34831 Tributary (branch) retinal vein occlusion, right eye, with macular edema: Secondary | ICD-10-CM | POA: Diagnosis not present

## 2019-10-25 DIAGNOSIS — H43813 Vitreous degeneration, bilateral: Secondary | ICD-10-CM | POA: Diagnosis not present

## 2019-11-04 ENCOUNTER — Ambulatory Visit: Payer: PPO | Admitting: Internal Medicine

## 2019-11-12 DIAGNOSIS — N184 Chronic kidney disease, stage 4 (severe): Secondary | ICD-10-CM | POA: Diagnosis not present

## 2019-11-12 DIAGNOSIS — I129 Hypertensive chronic kidney disease with stage 1 through stage 4 chronic kidney disease, or unspecified chronic kidney disease: Secondary | ICD-10-CM | POA: Diagnosis not present

## 2019-11-16 DIAGNOSIS — E663 Overweight: Secondary | ICD-10-CM | POA: Diagnosis not present

## 2019-11-16 DIAGNOSIS — R8290 Unspecified abnormal findings in urine: Secondary | ICD-10-CM | POA: Diagnosis not present

## 2019-11-16 DIAGNOSIS — L03116 Cellulitis of left lower limb: Secondary | ICD-10-CM | POA: Diagnosis not present

## 2019-11-16 DIAGNOSIS — N390 Urinary tract infection, site not specified: Secondary | ICD-10-CM | POA: Diagnosis not present

## 2019-11-16 DIAGNOSIS — S8992XA Unspecified injury of left lower leg, initial encounter: Secondary | ICD-10-CM | POA: Diagnosis not present

## 2019-11-18 DIAGNOSIS — D631 Anemia in chronic kidney disease: Secondary | ICD-10-CM | POA: Diagnosis not present

## 2019-11-18 DIAGNOSIS — I129 Hypertensive chronic kidney disease with stage 1 through stage 4 chronic kidney disease, or unspecified chronic kidney disease: Secondary | ICD-10-CM | POA: Diagnosis not present

## 2019-11-18 DIAGNOSIS — D508 Other iron deficiency anemias: Secondary | ICD-10-CM | POA: Diagnosis not present

## 2019-11-18 DIAGNOSIS — N184 Chronic kidney disease, stage 4 (severe): Secondary | ICD-10-CM | POA: Diagnosis not present

## 2019-11-18 DIAGNOSIS — E889 Metabolic disorder, unspecified: Secondary | ICD-10-CM | POA: Diagnosis not present

## 2019-11-18 DIAGNOSIS — I701 Atherosclerosis of renal artery: Secondary | ICD-10-CM | POA: Diagnosis not present

## 2019-11-18 DIAGNOSIS — E278 Other specified disorders of adrenal gland: Secondary | ICD-10-CM | POA: Diagnosis not present

## 2019-11-18 DIAGNOSIS — E559 Vitamin D deficiency, unspecified: Secondary | ICD-10-CM | POA: Diagnosis not present

## 2019-11-18 DIAGNOSIS — M9080 Osteopathy in diseases classified elsewhere, unspecified site: Secondary | ICD-10-CM | POA: Diagnosis not present

## 2019-11-24 DIAGNOSIS — I10 Essential (primary) hypertension: Secondary | ICD-10-CM | POA: Diagnosis not present

## 2019-11-24 DIAGNOSIS — E1165 Type 2 diabetes mellitus with hyperglycemia: Secondary | ICD-10-CM | POA: Diagnosis not present

## 2019-11-25 DIAGNOSIS — Z79899 Other long term (current) drug therapy: Secondary | ICD-10-CM | POA: Diagnosis not present

## 2019-11-25 DIAGNOSIS — N390 Urinary tract infection, site not specified: Secondary | ICD-10-CM | POA: Diagnosis not present

## 2019-11-29 ENCOUNTER — Encounter (INDEPENDENT_AMBULATORY_CARE_PROVIDER_SITE_OTHER): Payer: PPO | Admitting: Ophthalmology

## 2019-11-29 DIAGNOSIS — H43813 Vitreous degeneration, bilateral: Secondary | ICD-10-CM

## 2019-11-29 DIAGNOSIS — H34831 Tributary (branch) retinal vein occlusion, right eye, with macular edema: Secondary | ICD-10-CM | POA: Diagnosis not present

## 2019-11-29 DIAGNOSIS — I10 Essential (primary) hypertension: Secondary | ICD-10-CM

## 2019-11-29 DIAGNOSIS — D3132 Benign neoplasm of left choroid: Secondary | ICD-10-CM | POA: Diagnosis not present

## 2019-11-29 DIAGNOSIS — H353132 Nonexudative age-related macular degeneration, bilateral, intermediate dry stage: Secondary | ICD-10-CM

## 2019-11-29 DIAGNOSIS — H35033 Hypertensive retinopathy, bilateral: Secondary | ICD-10-CM

## 2019-12-06 DIAGNOSIS — F0631 Mood disorder due to known physiological condition with depressive features: Secondary | ICD-10-CM | POA: Diagnosis not present

## 2019-12-06 DIAGNOSIS — Z0000 Encounter for general adult medical examination without abnormal findings: Secondary | ICD-10-CM | POA: Diagnosis not present

## 2019-12-06 DIAGNOSIS — Z1331 Encounter for screening for depression: Secondary | ICD-10-CM | POA: Diagnosis not present

## 2019-12-06 DIAGNOSIS — Z6825 Body mass index (BMI) 25.0-25.9, adult: Secondary | ICD-10-CM | POA: Diagnosis not present

## 2019-12-07 ENCOUNTER — Encounter: Payer: Self-pay | Admitting: Internal Medicine

## 2019-12-07 ENCOUNTER — Other Ambulatory Visit: Payer: Self-pay

## 2019-12-07 ENCOUNTER — Ambulatory Visit (INDEPENDENT_AMBULATORY_CARE_PROVIDER_SITE_OTHER): Payer: PPO | Admitting: Internal Medicine

## 2019-12-07 DIAGNOSIS — N184 Chronic kidney disease, stage 4 (severe): Secondary | ICD-10-CM | POA: Diagnosis not present

## 2019-12-07 DIAGNOSIS — G4733 Obstructive sleep apnea (adult) (pediatric): Secondary | ICD-10-CM

## 2019-12-07 DIAGNOSIS — I129 Hypertensive chronic kidney disease with stage 1 through stage 4 chronic kidney disease, or unspecified chronic kidney disease: Secondary | ICD-10-CM

## 2019-12-07 DIAGNOSIS — F5101 Primary insomnia: Secondary | ICD-10-CM | POA: Diagnosis not present

## 2019-12-07 NOTE — Progress Notes (Signed)
HPI female never smoker followed for OSA, complicated by CAD/MI/stent, asthma (Dr. Neldon Mc), DM 2, CKD4 NPSG 12/05/10([place]) AHI 14.2/ hr, weight 178 lbs.  PFT 11/07/14-  Moderate Diffusion deficit 60%. Normal flows and lung volumes with insignificant response to bronchodilator. a1AT- 10/07/14-  Nl MM 123 OK  --------------------------------------------------------------------------------------------   05/04/2019- 79 year old female never smoker followed for OSA, complicated by CAD/MI/stent, Chronic Diastolic CHF, asthma (Dr. Neldon Mc), DM 2, HBP, CKD4 CPAP auto 5-15/[facility] -----OSA on CPAP, DME: [facility]; pt did not bring in SD card, no DL in [facility]; pt was recently hospitalized with pnuemonia for 10 days Hosp 3/31-12/03/18- CAP, microcytic anemia,  Says breathing ok now, no cough. Comfortable with her CPAP and has occasionally rested on it during day.  Cardiology tells her she is in chronic CHF. CXR 12/02/2018- IMPRESSION: Improving aeration of the right upper and mid lung with persisting opacity at the right lung base, potentially combination of pleural fluid and consolidation/atelectasis. Blunting at the left costophrenic angle likely represents small pleural fluid and atelectasis  12/07/19- 79 year old female never smoker followed for OSA, complicated by CAD/MI/stent, Chronic Diastolic CHF, asthma (Dr. Neldon Mc), DM 2, HBP, CKD4 CPAP auto 5-15/[facility] Started Seroquel last night- feels "loopy" today, CBG here today 258, Arrival BP 90/42 HR 91. Husband here. Download - Uses every night, but 4 hr compliance 56.7%. AHI 3/ hr. Body weight today 162 lbs Comfortable with CPAP. Getting supplies from [place]. Usually usess just around 4 hrs. Claims that's all she sleeps- discussed. Not resting well. Having low BP. CXR 05/04/2019- No active cardiopulmonary disease.   ROS see HPI    + = positive Constitutional:   No-   weight loss, night sweats, fevers, chills, fatigue, lassitude. HEENT:   No-  headaches,  difficulty swallowing, tooth/dental problems, sore throat,       No-  sneezing, itching, ear ache, + nasal congestion, post nasal drip,  CV:  No-   chest pain, orthopnea, PND, + swelling in lower extremities, anasarca,                                           dizziness, + palpitations Resp: No-   shortness of breath with exertion or at rest.              No-   productive cough,  No non-productive cough,  No- coughing up of blood.              No-   change in color of mucus.  No- wheezing.   Skin: No-   rash or lesions. GI:  No-   heartburn, + indigestion, abdominal pain, nausea, vomiting,  GU:  MS:  + joint pain or swelling.   Neuro-     nothing unusual Psych:  No- change in mood or affect. No depression or anxiety.  No memory loss.  OBJ- Physical Exam General- Alert, Oriented, Affect-appropriate, Distress- none acute, + wheelchair Skin- rash-none, lesions- none, excoriation- none Lymphadenopathy- none Head- atraumatic            Eyes- Gross vision intact, PERRLA, conjunctivae and secretions clear            Ears- Hearing, canals-normal            Nose- Clear, no-Septal dev, mucus, polyps, erosion, perforation             Throat- Mallampati III , mucosa clear , drainage-  none, tonsils- atrophic Neck- flexible , trachea midline, no stridor , thyroid nl, carotid no bruit Chest - symmetrical excursion , unlabored           Heart/CV- RRR , no murmur , no gallop  , no rub, nl s1 s2                           - JVD- none , edema- none, stasis changes- none, varices- none           Lung- clear to P&A, wheeze- none, cough- none , dullness-none, rub- none           Chest wall-  Abd-  Br/ Gen/ Rectal- Not done, not indicated Extrem- cyanosis- none, clubbing, none, atrophy- none, strength- nl. Neuro- grossly intact to observation

## 2019-12-07 NOTE — Patient Instructions (Signed)
Your CPAP works well and we can continue current pressures. You need to try to wear it for at least 4 hours/ night, if you can. If there are problems or discomfort with your CPAP. Please let us know.  Please talk with your other doctors about your low blood pressure as we discussed.  Please call if we can help

## 2019-12-16 DIAGNOSIS — Z6825 Body mass index (BMI) 25.0-25.9, adult: Secondary | ICD-10-CM | POA: Diagnosis not present

## 2019-12-16 DIAGNOSIS — I10 Essential (primary) hypertension: Secondary | ICD-10-CM | POA: Diagnosis not present

## 2019-12-20 NOTE — Progress Notes (Signed)
Cardiology Office Note:    Date:  12/21/2019   ID:  [name], DOB 1941-01-12, MRN [id number]  PCP:  [name], MD  Cardiologist:  Shirlee More, MD    Referring MD: [name], MD    ASSESSMENT:    1. Coronary artery disease involving native coronary artery of native heart without angina pectoris   2. Hypertensive heart and kidney disease with chronic diastolic congestive heart failure and stage 4 chronic kidney disease ([place])   3. Anemia in stage 4 chronic kidney disease ([place])   4. Hyperlipidemia, unspecified hyperlipidemia type    PLAN:    In order of problems listed above:  1. Stable CAD New York Heart Association class I no angina on current medical treatment for new treatment with low-dose aspirin beta-blocker High intensity statin and Zetia.  At this time I would not advise an ischemia evaluation 2. Stable with CKD continue current antihypertensives including clonidine 3. In the setting of CKD I asked her to discuss with neurologist if she requires parenteral iron or erythropoietin 4. Continue current treatment she tells me she will have labs performed at her PCP office next 1 to 2 months she is having no muscle pain or weakness   Next appointment: 6 months   Medication Adjustments/Labs and Tests Ordered: Current medicines are reviewed at length with the patient today.  Concerns regarding medicines are outlined above.  No orders of the defined types were placed in this encounter.  No orders of the defined types were placed in this encounter.   Chief Complaint  Patient presents with  . Follow-up    History of Present Illness:    Leah Walsh is a 79 y.o. female with a hx of CAD previous PCI and stent LAD, CKD stage IV, renal artery stenosis  and hypertension followed by [facility] nephrology.She was admitted to [facility] with pneumonia 11/23/18 to  12/03/18.  Her case was complicated by a low level troponin elevation not felt to reflect acute coronary  syndrome, acute on chronic stage IV CKD, severe anemia hemoglobin 6.9 requiring transfusion and  volume overload with a net gain of 3 kg and acute decompensated heart failure responding to IV diuretics.   She was last seen 05/25/2019. Compliance with diet, lifestyle and medications: Yes  She follows with nephrology CKD is stable she is anemic and nephrologist put her on oral iron and I told her to ask next visit if she requires parenteral erythropoietin.  Previously anemia was the mechanism of decompensated heart failure.  She is not having edema shortness of breath orthopnea chest pain palpitation or syncope.  Last hemoglobin remains low at 8.7.  Most recent lipid profile 02/03/2019 cholesterol 127 HDL 34 LDL 63 Past Medical History:  Diagnosis Date  . Acute renal failure superimposed on stage 4 chronic kidney disease ([place]) 11/24/2018  . Acute Respiratory failure with hypoxia ([place]) 11/28/2018  . AKI (acute kidney injury) ([place]) 12/21/2018  . Anemia in stage 4 chronic kidney disease ([place]) 12/17/2018  . Asthma   . Asthma, mild persistent 08/06/2011  . Benign hypertension with CKD (chronic kidney disease) stage IV ([place]) 08/06/2011  . CAP (community acquired pneumonia) 11/24/2018  . Chronic diastolic heart failure ([place]) 12/16/2018   Is admitted to [facility] 11/23/2020 12/03/2018 with community-acquired pneumonia.  Her case was complicated by severe anemia hemoglobin 6.9 transfused 8.3 and acute superimposed on stage IV CKD.  In hospital she developed volume overload with her weight rising 3 kg in  overt congestive heart failure and required IV diuretics.  Although she was volume overloaded and radiographically was in  . Chronic kidney disease, stage 4 (severe) ([place]) 08/06/2011  . Coronary artery disease involving native coronary artery of native heart without angina pectoris 07/03/2017   1. ASHD (arteriosclerotic heart disease)  2. Old anteroseptal myocardial infarction  3. Resolute DES to LAD 04/2013    Review of records from care everywhere [facility] regional cardiology in [place] show a history of known CAD previous anterior septal myocardial infarction and PCI and stent LAD drug-eluting in September 2014.  . Diabetes ([place])   . Dyspnea on exertion 08/04/2014   History of coronary disease/MI/stent PFT 11/07/2014-moderate diffusion defect 60% predicted with normal lung volumes and spirometry flows.   . Elevated troponin 11/24/2018   Low level 0.8 with pneumonia anemia and severe CKD  . GERD (gastroesophageal reflux disease) 10/07/2014  . Heart attack ([place]) 2014  . History of gastroesophageal reflux (GERD) 08/06/2011  . Hyperlipidemia   . Hyperphosphatemia 08/22/2016  . Hypertension   . Hypertensive heart and kidney disease with chronic diastolic congestive heart failure and stage 4 chronic kidney disease ([place]) 11/24/2018  . Hypokalemia 02/17/2019  . Hypomagnesemia 01/02/2012  . Hyponatremia 11/24/2018  . Insomnia 08/04/2014  . Insulin-requiring or dependent type II diabetes mellitus ([place]) 08/06/2011  . Iron deficiency anemia 06/18/2018  . Kidney disease   . Metabolic bone disease 0/27/7412  . Microcytic anemia 01/02/2012  . Nausea vomiting and diarrhea 11/24/2018  . Obstructive sleep apnea syndrome 08/06/2011   NPSG 12/05/10([place]) AHI 14.2/ hr, weight 178 lbs.  . Old MI (myocardial infarction) 03/12/2015   Stent  . Other specified disorders of adrenal gland ([place]) 09/24/2011  . Pelvic mass 12/20/2016   Overview:  Added automatically from request for surgery [id number]  Formatting of this note might be different from the original. Added automatically from request for surgery [id number]  . Renal artery stenosis ([place]) 12/03/2018   She had a duplex in September 2019, report is not in Epic but described in office note November 2019: "She does have probably atherosclerotic occlusion of the left renal artery with a small atrophic left kidney canal right renal artery has a proximal stenosis there is clearly  hemodynamically significant. With her last 2 renal duplex study showing velocities of 340 and then a little bit less than 2  . Sleep apnea    Using CPAP  . Vitamin D deficiency 01/22/2013    Past Surgical History:  Procedure Laterality Date  . ABDOMINAL HYSTERECTOMY    . CAROTID STENT    . CHOLECYSTECTOMY    . TUBAL LIGATION    . 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 VESICOVAGINAL FISTULA CLOSURE W/ TAH      Current Medications: Current Meds  Medication Sig  . acetaminophen (TYLENOL) 500 MG tablet Take 1,000 mg by mouth every 8 (eight) hours as needed (pain / fever).  [name] albuterol (PROVENTIL HFA) 108 (90 Base) MCG/ACT inhaler Inhale two puffs every four to six hours as needed for cough or wheeze  . allopurinol (ZYLOPRIM) 100 MG tablet Take 100 mg by mouth daily.  [name] aspirin 81 MG tablet Take 81 mg by mouth at bedtime.   [name] atorvastatin (LIPITOR) 80 MG tablet TAKE 1 TABLET (80 MG TOTAL) BY MOUTH AT BEDTIME.  [name] B-D UF III MINI PEN NEEDLES 31G X 5 MM MISC   . budesonide-formoterol (SYMBICORT) 80-4.5 MCG/ACT inhaler SMARTSIG:2 Puff(s) By Mouth Twice Daily  .  carvedilol (COREG) 25 MG tablet TAKE 1 TABLET (25 MG TOTAL) BY MOUTH 2 (TWO) TIMES DAILY WITH A MEAL.  [name] Cholecalciferol (VITAMIN D) 2000 UNITS CAPS Take 1 capsule by mouth daily.  . cloNIDine (CATAPRES) 0.2 MG tablet Take 0.2 mg by mouth 3 (three) times daily.   . Cobalamin Combinations (B12 FOLATE PO) Take 800 mcg by mouth daily.  . Coenzyme Q10 (COQ10) 200 MG CAPS Take 1 capsule by mouth daily.  . cycloSPORINE (RESTASIS) 0.05 % ophthalmic emulsion Place 1 drop into both eyes 2 (two) times daily.  [name] ezetimibe (ZETIA) 10 MG tablet TAKE 1 TABLET (10 MG TOTAL) BY MOUTH DAILY.  [name] FEROSUL 325 (65 Fe) MG tablet Take 325 mg by mouth 2 (two) times daily.  . hydrALAZINE (APRESOLINE) 10 MG tablet Take 10 mg by mouth 3 (three) times daily.  . insulin lispro (HUMALOG) 100 UNIT/ML injection Inject into the skin.  [name] isosorbide mononitrate  (IMDUR) 120 MG 24 hr tablet TAKE 1 TABLET (120 MG TOTAL) BY MOUTH DAILY  . montelukast (SINGULAIR) 10 MG tablet Take 1 tablet (10 mg total) by mouth daily.  . Multiple Vitamin (MULTIVITAMIN) tablet Take 1 tablet by mouth daily.  . Multiple Vitamins-Minerals (PRESERVISION AREDS 2 PO) Take 1 capsule by mouth 2 (two) times daily.   [name] NIFEdipine (ADALAT CC) 90 MG 24 hr tablet TAKE 1 TABLET (90 MG TOTAL) BY MOUTH DAILY.  . nitroGLYCERIN (NITROSTAT) 0.4 MG SL tablet Place 1 tablet (0.4 mg total) under the tongue every 5 (five) minutes as needed for chest pain.  [name] omeprazole (PRILOSEC) 40 MG capsule Take 40 mg by mouth every morning.  [name] omeprazole (PRILOSEC) 40 MG capsule Take 40 mg by mouth daily.  . potassium chloride (KLOR-CON) 10 MEQ tablet Take 10 mEq by mouth every morning.  [name] QUEtiapine (SEROQUEL) 50 MG tablet Take 50 mg by mouth at bedtime.  . sertraline (ZOLOFT) 25 MG tablet Take 25 mg by mouth daily.  . sodium chloride (OCEAN) 0.65 % SOLN nasal spray Place 1 spray into both nostrils at bedtime.  . torsemide (DEMADEX) 20 MG tablet Take 20 mg by mouth in the morning, at noon, and at bedtime.   . [DISCONTINUED] omeprazole (PRILOSEC) 20 MG capsule Take 20 mg by mouth daily.      Allergies:   Compazine [prochlorperazine edisylate], Colcrys [colchicine], Hydrocodone, and Codeine   Social History   Socioeconomic History  . Marital status: Married    Spouse name: Not on file  . Number of children: Not on file  . Years of education: Not on file  . Highest education level: Not on file  Occupational History  . Occupation: retired  Tobacco Use  . Smoking status: Never Smoker  . Smokeless tobacco: Never Used  Substance and Sexual Activity  . Alcohol use: No    Alcohol/week: 0.0 standard drinks  . Drug use: No  . Sexual activity: Not on file  Other Topics Concern  . Not on file  Social History Narrative  . Not on file   Social Determinants of Health   Financial Resource Strain:   .  Difficulty of Paying Living Expenses:   Food Insecurity:   . Worried About Charity fundraiser in the Last Year:   . Arboriculturist in the Last Year:   Transportation Needs:   . Film/video editor (Medical):   [name] Lack of Transportation (Non-Medical):   Physical Activity:   . Days of Exercise per Week:   .  Minutes of Exercise per Session:   Stress:   . Feeling of Stress :   Social Connections:   . Frequency of Communication with Friends and Family:   . Frequency of Social Gatherings with Friends and Family:   . Attends Religious Services:   . Active Member of Clubs or Organizations:   . Attends Archivist Meetings:   [name] Marital Status:      Family History: The patient's family history includes Diabetes in her sister and sister; Heart disease in her father, maternal grandfather, maternal grandmother, mother, paternal grandfather, and paternal grandmother; Lung cancer in her brother. ROS:   Please see the history of present illness.    All other systems reviewed and are negative.  EKGs/Labs/Other Studies Reviewed:    The following studies were reviewed today:    Recent Labs: 11/12/2019: CMP shows a potassium 4.0 creatinine 2.36 GFR 19 cc stable hemoglobin 20.7  Physical Exam:    VS:  BP 130/60 (BP Location: Left Arm, Patient Position: Sitting, Cuff Size: Normal)   Pulse 70   Ht 5' 7.5" (1.715 m)   Wt 164 lb (74.4 kg)   SpO2 97%   BMI 25.31 kg/m     Wt Readings from Last 3 Encounters:  12/21/19 164 lb (74.4 kg)  12/07/19 162 lb 6.4 oz (73.7 kg)  05/25/19 169 lb (76.7 kg)     GEN:  Well nourished, well developed in no acute distress HEENT: Normal NECK: No JVD; No carotid bruits LYMPHATICS: No lymphadenopathy CARDIAC: RRR, no murmurs, rubs, gallops RESPIRATORY:  Clear to auscultation without rales, wheezing or rhonchi  ABDOMEN: Soft, non-tender, non-distended MUSCULOSKELETAL:  No edema; No deformity  SKIN: Warm and dry there is pallor of the skin of  membranes NEUROLOGIC:  Alert and oriented x 3 PSYCHIATRIC:  Normal affect    Signed, Shirlee More, MD  12/21/2019 4:08 PM    [facility]

## 2019-12-21 ENCOUNTER — Ambulatory Visit: Payer: PPO | Admitting: Cardiology

## 2019-12-21 ENCOUNTER — Other Ambulatory Visit: Payer: Self-pay

## 2019-12-21 VITALS — BP 130/60 | HR 70 | Ht 67.5 in | Wt 164.0 lb

## 2019-12-21 DIAGNOSIS — I130 Hypertensive heart and chronic kidney disease with heart failure and stage 1 through stage 4 chronic kidney disease, or unspecified chronic kidney disease: Secondary | ICD-10-CM

## 2019-12-21 DIAGNOSIS — I5032 Chronic diastolic (congestive) heart failure: Secondary | ICD-10-CM

## 2019-12-21 DIAGNOSIS — N184 Chronic kidney disease, stage 4 (severe): Secondary | ICD-10-CM | POA: Diagnosis not present

## 2019-12-21 DIAGNOSIS — E785 Hyperlipidemia, unspecified: Secondary | ICD-10-CM | POA: Diagnosis not present

## 2019-12-21 DIAGNOSIS — D631 Anemia in chronic kidney disease: Secondary | ICD-10-CM | POA: Diagnosis not present

## 2019-12-21 DIAGNOSIS — I2510 Atherosclerotic heart disease of native coronary artery without angina pectoris: Secondary | ICD-10-CM | POA: Diagnosis not present

## 2019-12-21 NOTE — Patient Instructions (Signed)

## 2019-12-24 DIAGNOSIS — I10 Essential (primary) hypertension: Secondary | ICD-10-CM | POA: Diagnosis not present

## 2019-12-24 DIAGNOSIS — K219 Gastro-esophageal reflux disease without esophagitis: Secondary | ICD-10-CM | POA: Diagnosis not present

## 2020-01-03 ENCOUNTER — Encounter (INDEPENDENT_AMBULATORY_CARE_PROVIDER_SITE_OTHER): Payer: PPO | Admitting: Ophthalmology

## 2020-01-03 ENCOUNTER — Other Ambulatory Visit: Payer: Self-pay

## 2020-01-03 DIAGNOSIS — H34831 Tributary (branch) retinal vein occlusion, right eye, with macular edema: Secondary | ICD-10-CM

## 2020-01-03 DIAGNOSIS — H35033 Hypertensive retinopathy, bilateral: Secondary | ICD-10-CM | POA: Diagnosis not present

## 2020-01-03 DIAGNOSIS — H353132 Nonexudative age-related macular degeneration, bilateral, intermediate dry stage: Secondary | ICD-10-CM | POA: Diagnosis not present

## 2020-01-03 DIAGNOSIS — I10 Essential (primary) hypertension: Secondary | ICD-10-CM | POA: Diagnosis not present

## 2020-01-03 DIAGNOSIS — D3132 Benign neoplasm of left choroid: Secondary | ICD-10-CM | POA: Diagnosis not present

## 2020-01-03 DIAGNOSIS — H43813 Vitreous degeneration, bilateral: Secondary | ICD-10-CM | POA: Diagnosis not present

## 2020-01-05 ENCOUNTER — Other Ambulatory Visit: Payer: Self-pay

## 2020-01-06 ENCOUNTER — Telehealth: Payer: Self-pay | Admitting: *Deleted

## 2020-01-06 ENCOUNTER — Ambulatory Visit (INDEPENDENT_AMBULATORY_CARE_PROVIDER_SITE_OTHER): Payer: PPO | Admitting: Sports Medicine

## 2020-01-06 ENCOUNTER — Other Ambulatory Visit: Payer: Self-pay

## 2020-01-06 ENCOUNTER — Encounter: Payer: Self-pay | Admitting: Sports Medicine

## 2020-01-06 ENCOUNTER — Ambulatory Visit (INDEPENDENT_AMBULATORY_CARE_PROVIDER_SITE_OTHER): Payer: PPO

## 2020-01-06 DIAGNOSIS — I739 Peripheral vascular disease, unspecified: Secondary | ICD-10-CM

## 2020-01-06 DIAGNOSIS — L97511 Non-pressure chronic ulcer of other part of right foot limited to breakdown of skin: Secondary | ICD-10-CM

## 2020-01-06 DIAGNOSIS — E1140 Type 2 diabetes mellitus with diabetic neuropathy, unspecified: Secondary | ICD-10-CM | POA: Diagnosis not present

## 2020-01-06 DIAGNOSIS — B351 Tinea unguium: Secondary | ICD-10-CM

## 2020-01-06 DIAGNOSIS — M79609 Pain in unspecified limb: Secondary | ICD-10-CM | POA: Diagnosis not present

## 2020-01-06 NOTE — Addendum Note (Signed)
Addended by: Allean Found on: 01/06/2020 02:14 PM   Modules accepted: Orders

## 2020-01-06 NOTE — Telephone Encounter (Signed)
Faxed orders to [facility] - [place] [phone], f [phone].

## 2020-01-06 NOTE — Progress Notes (Signed)
Subjective: Leah Walsh is a 79 y.o. female patient seen in office for evaluation of ulceration of the right great toe. Patient has a history of diabetes and a blood glucose level  today of 44 mg/dl, last A1c 7.4.   Patient is changing the dressing using Polysporin and reports that he still has been bleeding for the last 6 weeks with slight redness swelling but no warmth, denies nausea/fever/vomiting/chills/night sweats/shortness of breath/pain. Patient also reports that she is concerned about her thick toenails especially at the big toe that have been getting thicker over the last 6 to 8 months there is no pain but she has tried over-the-counter fungal treatment without any improvement.  Patient has no other pedal complaints at this time.  Last PCP visit with Dr. Helene Kelp 12/16/2019  Review of symptoms noncontributory  Patient Active Problem List   Diagnosis Date Noted  . Hypokalemia 02/17/2019  . AKI (acute kidney injury) ([place]) 12/21/2018  . Anemia in stage 4 chronic kidney disease ([place]) 12/17/2018  . Chronic diastolic heart failure ([place]) 12/16/2018  . Renal artery stenosis ([place]) 12/03/2018  . Acute Respiratory failure with hypoxia ([place]) 11/28/2018  . Acute renal failure superimposed on stage 4 chronic kidney disease ([place]) 11/24/2018  . CAP (community acquired pneumonia) 11/24/2018  . Hypertensive heart and kidney disease with chronic diastolic congestive heart failure and stage 4 chronic kidney disease ([place]) 11/24/2018  . Hyponatremia 11/24/2018  . Nausea vomiting and diarrhea 11/24/2018  . Elevated troponin 11/24/2018  . Iron deficiency anemia 06/18/2018  . Coronary artery disease involving native coronary artery of native heart without angina pectoris 07/03/2017  . Pelvic mass 12/20/2016  . Hyperphosphatemia 08/22/2016  . Metabolic bone disease 64/40/3474  . Old MI (myocardial infarction) 03/12/2015  . GERD (gastroesophageal reflux disease) 10/07/2014  . Insomnia 08/04/2014  . Dyspnea  on exertion 08/04/2014  . Vitamin D deficiency 01/22/2013  . Microcytic anemia 01/02/2012  . Hypomagnesemia 01/02/2012  . Other specified disorders of adrenal gland ([place]) 09/24/2011  . Obstructive sleep apnea syndrome 08/06/2011  . Asthma, mild persistent 08/06/2011  . Insulin-requiring or dependent type II diabetes mellitus ([place]) 08/06/2011  . Chronic kidney disease, stage 4 (severe) ([place]) 08/06/2011  . History of gastroesophageal reflux (GERD) 08/06/2011  . Benign hypertension with CKD (chronic kidney disease) stage IV ([place]) 08/06/2011   Current Outpatient Medications on File Prior to Visit  Medication Sig Dispense Refill  . albuterol (PROVENTIL) (2.5 MG/3ML) 0.083% nebulizer solution Take 2.5 mg by nebulization every 6 (six) hours as needed for wheezing or shortness of breath.    . allopurinol (ZYLOPRIM) 100 MG tablet Take 100 mg by mouth daily.  3  . aspirin 81 MG tablet Take 81 mg by mouth at bedtime.     [name] atorvastatin (LIPITOR) 80 MG tablet TAKE 1 TABLET (80 MG TOTAL) BY MOUTH AT BEDTIME. 90 tablet 1  . B-D UF III MINI PEN NEEDLES 31G X 5 MM MISC   6  . budesonide-formoterol (SYMBICORT) 80-4.5 MCG/ACT inhaler SMARTSIG:2 Puff(s) By Mouth Twice Daily    . carvedilol (COREG) 25 MG tablet TAKE 1 TABLET (25 MG TOTAL) BY MOUTH 2 (TWO) TIMES DAILY WITH A MEAL. 180 tablet 1  . Cholecalciferol (VITAMIN D) 2000 UNITS CAPS Take 1 capsule by mouth daily.    . cloNIDine (CATAPRES) 0.2 MG tablet Take 0.2 mg by mouth 3 (three) times daily.     . Cobalamin Combinations (B12 FOLATE PO) Take 800 mcg by mouth daily.    . Coenzyme Q10 (  COQ10) 200 MG CAPS Take 1 capsule by mouth daily.    . Continuous Blood Gluc Sensor (FREESTYLE LIBRE 14 DAY SENSOR) MISC USE every 14 DAYS as directed    . ezetimibe (ZETIA) 10 MG tablet TAKE 1 TABLET (10 MG TOTAL) BY MOUTH DAILY. 90 tablet 1  . FEROSUL 325 (65 Fe) MG tablet Take 325 mg by mouth 2 (two) times daily.    . hydrALAZINE (APRESOLINE) 10 MG tablet Take 10 mg  by mouth 3 (three) times daily.    . insulin lispro (HUMALOG) 100 UNIT/ML injection Inject into the skin.    [name] isosorbide mononitrate (IMDUR) 120 MG 24 hr tablet TAKE 1 TABLET (120 MG TOTAL) BY MOUTH DAILY 90 tablet 1  . montelukast (SINGULAIR) 10 MG tablet Take 1 tablet (10 mg total) by mouth daily. 30 tablet 5  . Multiple Vitamin (MULTIVITAMIN) tablet Take 1 tablet by mouth daily.    . Multiple Vitamins-Minerals (PRESERVISION AREDS 2 PO) Take 1 capsule by mouth 2 (two) times daily.     . nitroGLYCERIN (NITROSTAT) 0.4 MG SL tablet Place 0.4 mg under the tongue every 5 (five) minutes as needed for chest pain.    [name] omeprazole (PRILOSEC) 40 MG capsule Take 40 mg by mouth daily.    . potassium chloride (KLOR-CON) 10 MEQ tablet Take 10 mEq by mouth every morning.    [name] QUEtiapine (SEROQUEL) 50 MG tablet Take 50 mg by mouth at bedtime.    . sertraline (ZOLOFT) 25 MG tablet Take 25 mg by mouth daily.    [name] torsemide (DEMADEX) 20 MG tablet Take 20 mg by mouth in the morning, at noon, and at bedtime.      No current facility-administered medications on file prior to visit.   Allergies  Allergen Reactions  . Compazine [Prochlorperazine Edisylate] Other (See Comments)    Stroke like symptoms  . Colcrys [Colchicine] Diarrhea  . Hydrocodone   . Codeine Nausea And Vomiting    No results found for this or any previous visit (from the past 2160 hour(s)).  Objective: There were no vitals filed for this visit.  General: Patient is awake, alert, oriented x 3 and in no acute distress.  Dermatology: Skin is warm and dry bilateral with a partial thickness ulceration present right plantar medial hallux ulceration measures 0.9 cm x 0.3 cm x 0.1 cm. There is a keratotic border with a granular base. The ulceration does not  probe to bone. There is no malodor, no active drainage, blanchable erythema, mild edema. No acute signs of infection.  Severely thickened bilateral hallux toenails likely consistent with  onychomycosis versus traumatic nail changes secondary to foot type and bunions   Vascular: Dorsalis Pedis pulse = 1/4 Bilateral,  Posterior Tibial pulse = 0/4 Bilateral,  Capillary Fill Time < 5 seconds, 1+ pitting edema bilateral  Neurologic: Protective sensation severely diminished bilateral with Weinstein monofilament  Musculosketal:  No Pain with palpation to ulcerated area.  Significant bunion hammertoe and pes planus deformity bilateral right greater than left.  Xrays, Right foot: At area of first toe ulceration no bony destruction suggestive of osteomyelitis. No gas in soft tissues.  Joint space narrowing and bone spurs noted at the first metatarsophalangeal joint as well as at the heel.  No results for input(s): GRAMSTAIN, LABORGA in the last 8760 hours.  Assessment and Plan:  Problem List Items Addressed This Visit    None    Visit Diagnoses    Skin ulcer of toe of right foot,  limited to breakdown of skin ([place])    -  Primary   Relevant Orders   DG Foot Complete Right   PAD (peripheral artery disease) (HCC)       Relevant Medications   nitroGLYCERIN (NITROSTAT) 0.4 MG SL tablet   Pain due to onychomycosis of nail       Type 2 diabetes mellitus with diabetic neuropathy, unspecified whether long term insulin use ([place])           -Examined patient and discussed the progression of the wound and treatment alternatives. -Xrays reviewed - Excisionally dedbrided ulceration at right great toe to healthy bleeding borders removing nonviable tissue using a sterile chisel blade. Wound measures post debridement as above. Wound was debrided to the level of the dermis with viable wound base exposed to promote healing. Hemostasis was achieved with manuel pressure. Patient tolerated procedure well without any discomfort or anesthesia necessary for this wound debridement.  -Wound culture was obtained will call patient whether she needs to start on antibiotics -Applied Iodosorb and dry sterile  dressing and instructed patient to continue with daily dressings at home consisting of same and to use tube foam to offload the area - Advised patient to go to the ER or return to office if the wound worsens or if constitutional symptoms are present. -Ordered ABIs for surveillance in the setting of a nonhealing wound with nonpalpable pulses -Advised patient that her nails are of least importance at this time I will keep them trimmed and once we get her toe ulcer heal we can further talk about treatment for nail fungus.  At no additional charge using a sterile nail nipper debrided nails bilateral hallux and smoothed them thin using a rotary bur without incident to patient's satisfaction -Patient to return to office in 2 weeks for follow up care and evaluation or sooner if problems arise.  Landis Martins, DPM

## 2020-01-06 NOTE — Telephone Encounter (Signed)
-----   Message from Leah Walsh, [place] sent at 01/06/2020 12:45 PM EDT ----- Regarding: ABIs ABIs bilateral PAD with right toe ulcer present for 6 weeks not healing

## 2020-01-07 ENCOUNTER — Telehealth: Payer: Self-pay | Admitting: *Deleted

## 2020-01-07 NOTE — Telephone Encounter (Signed)
[facility] - Seth Bake states next available is 01/26/2020.

## 2020-01-07 NOTE — Telephone Encounter (Signed)
I think that is too far out since she has an ulcer. She if we can get her in sooner for ABIs somewhere else or if they have a cancellation list they can put her on to try to get her in sooner. I dont want her to have to wait more than 2-3 weeks for this Thanks Dr. Cannon Kettle

## 2020-01-08 NOTE — Assessment & Plan Note (Signed)
Reports low BP on first moving around in the morning. Plan- cautioned to sit on edge of bed. Discuss with PCP

## 2020-01-08 NOTE — Assessment & Plan Note (Signed)
Benefits from CPAP but needs to sleep more/ use CPAP more. Education about goals and sleep hygiene done.

## 2020-01-08 NOTE — Assessment & Plan Note (Signed)
Discussed sleep hygiene. Consider occ use of otc sleep aid if needed.

## 2020-01-10 ENCOUNTER — Other Ambulatory Visit: Payer: Self-pay | Admitting: Sports Medicine

## 2020-01-10 LAB — WOUND CULTURE: Organism ID, Bacteria: NONE SEEN

## 2020-01-10 MED ORDER — SULFAMETHOXAZOLE-TRIMETHOPRIM 400-80 MG PO TABS: 1.0000 | ORAL_TABLET | Freq: Two times a day (BID) | ORAL | 0 refills | Status: DC

## 2020-01-10 NOTE — Telephone Encounter (Signed)
Left message for Leah Walsh acknowledging the change of appt to 01/19/2020.

## 2020-01-10 NOTE — Progress Notes (Signed)
Bactrim sent to pharmacy for + wound culture -Dr. Chauncey Cruel

## 2020-01-10 NOTE — Telephone Encounter (Signed)
Ok thanks 

## 2020-01-11 ENCOUNTER — Telehealth: Payer: Self-pay

## 2020-01-11 NOTE — Telephone Encounter (Signed)
Let her know we can fax the culture results to her kidney doctor to see which antibiotic they recommend since bactrim is one that they do not want her on.

## 2020-01-11 NOTE — Telephone Encounter (Signed)
-----   Message from Landis Martins, [place] sent at 01/10/2020  6:53 AM EDT ----- Will you let patient know Bactrim has been sent to pharmacy for + wound culture -Dr. Chauncey Cruel

## 2020-01-11 NOTE — Telephone Encounter (Signed)
Pt was informed of abx sent to pharmacy. Pt states her kidney Dr. Does not want her on bactrim due to her stage 4 kidney dz. Please advice [facility]acy: [facility]  in [place]

## 2020-01-11 NOTE — Telephone Encounter (Signed)
Called Pt and advised her that per Dr. Cannon Kettle we can fax the cx results to her kidney Dr to see what abx they suggest for her to take. Pt states she will call her Kidney Dr. Directly, Let them know of the situation and would return our phone call.

## 2020-01-11 NOTE — Telephone Encounter (Signed)
Ok thanks 

## 2020-01-14 ENCOUNTER — Telehealth: Payer: Self-pay

## 2020-01-14 ENCOUNTER — Other Ambulatory Visit: Payer: Self-pay | Admitting: Sports Medicine

## 2020-01-14 NOTE — Telephone Encounter (Signed)
Her nephrologist is sending Leah Walsh to her pharmacy for 7 days to treat the infection -Dr. Cannon Kettle

## 2020-01-14 NOTE — Telephone Encounter (Signed)
Pt called and LVM stating she had contacted her Nephrologist in [place] and she was told by her Dr. Parks Ranger they want to speak directly to Dr. Cannon Kettle to see what exactly is going on and to come up with an abx that would be safe  For the pt.  Nephrologist: Thereasa Distance, MD Number: [phone]

## 2020-01-17 ENCOUNTER — Other Ambulatory Visit (HOSPITAL_COMMUNITY): Payer: Self-pay | Admitting: Sports Medicine

## 2020-01-17 DIAGNOSIS — R441 Visual hallucinations: Secondary | ICD-10-CM | POA: Diagnosis not present

## 2020-01-17 DIAGNOSIS — I10 Essential (primary) hypertension: Secondary | ICD-10-CM | POA: Diagnosis not present

## 2020-01-17 DIAGNOSIS — I739 Peripheral vascular disease, unspecified: Secondary | ICD-10-CM

## 2020-01-17 DIAGNOSIS — Z6825 Body mass index (BMI) 25.0-25.9, adult: Secondary | ICD-10-CM | POA: Diagnosis not present

## 2020-01-17 NOTE — Telephone Encounter (Signed)
Spoke with pt and informed that her nephrologist sent Omnicef to her pharmacy to treat the infection, Pt is to take for 7 days. Pt states she already picked up the Rx

## 2020-01-19 ENCOUNTER — Other Ambulatory Visit: Payer: Self-pay

## 2020-01-19 ENCOUNTER — Ambulatory Visit (HOSPITAL_COMMUNITY)
Admission: RE | Admit: 2020-01-19 | Discharge: 2020-01-19 | Disposition: A | Discharge status: Home / Self Care | Payer: PPO | Source: Ambulatory Visit | Attending: Cardiovascular Disease | Admitting: Cardiovascular Disease

## 2020-01-19 DIAGNOSIS — E1140 Type 2 diabetes mellitus with diabetic neuropathy, unspecified: Secondary | ICD-10-CM | POA: Diagnosis not present

## 2020-01-19 DIAGNOSIS — I739 Peripheral vascular disease, unspecified: Secondary | ICD-10-CM | POA: Diagnosis not present

## 2020-01-19 DIAGNOSIS — L97511 Non-pressure chronic ulcer of other part of right foot limited to breakdown of skin: Secondary | ICD-10-CM | POA: Diagnosis not present

## 2020-01-20 ENCOUNTER — Ambulatory Visit: Payer: PPO | Admitting: Sports Medicine

## 2020-01-21 ENCOUNTER — Ambulatory Visit: Payer: PPO | Admitting: Sports Medicine

## 2020-01-21 ENCOUNTER — Other Ambulatory Visit: Payer: Self-pay

## 2020-01-21 ENCOUNTER — Encounter: Payer: Self-pay | Admitting: Sports Medicine

## 2020-01-21 ENCOUNTER — Telehealth: Payer: Self-pay | Admitting: *Deleted

## 2020-01-21 DIAGNOSIS — L97511 Non-pressure chronic ulcer of other part of right foot limited to breakdown of skin: Secondary | ICD-10-CM

## 2020-01-21 DIAGNOSIS — E1140 Type 2 diabetes mellitus with diabetic neuropathy, unspecified: Secondary | ICD-10-CM | POA: Diagnosis not present

## 2020-01-21 DIAGNOSIS — I739 Peripheral vascular disease, unspecified: Secondary | ICD-10-CM

## 2020-01-21 NOTE — Telephone Encounter (Signed)
Patient has stenosis on there vascular studies and a ulcer, refer to vascular, per Dr. Leeanne Rio orders.

## 2020-01-21 NOTE — Progress Notes (Signed)
Subjective: Leah Walsh is a 79 y.o. female patient seen in office for follow-up evaluation of ulceration of the right great toe. Patient has a history of diabetes and a blood glucose level today of 88 mg/dl, last A1c 7.4.   Patient is changing the dressing using Iodosorb with the help of her husband and thinks that it is looking a little better, denies nausea/fever/vomiting/chills/night sweats/shortness of breath/pain. Patient also reports that she is concerned about her thick toenails again this visit.  Patient has no other pedal complaints at this time.  Patient Active Problem List   Diagnosis Date Noted  . Hypokalemia 02/17/2019  . AKI (acute kidney injury) ([place]) 12/21/2018  . Anemia in stage 4 chronic kidney disease ([place]) 12/17/2018  . Chronic diastolic heart failure ([place]) 12/16/2018  . Renal artery stenosis ([place]) 12/03/2018  . Acute Respiratory failure with hypoxia ([place]) 11/28/2018  . Acute renal failure superimposed on stage 4 chronic kidney disease ([place]) 11/24/2018  . CAP (community acquired pneumonia) 11/24/2018  . Hypertensive heart and kidney disease with chronic diastolic congestive heart failure and stage 4 chronic kidney disease ([place]) 11/24/2018  . Hyponatremia 11/24/2018  . Nausea vomiting and diarrhea 11/24/2018  . Elevated troponin 11/24/2018  . Iron deficiency anemia 06/18/2018  . Coronary artery disease involving native coronary artery of native heart without angina pectoris 07/03/2017  . Pelvic mass 12/20/2016  . Hyperphosphatemia 08/22/2016  . Metabolic bone disease 40/98/1191  . Old MI (myocardial infarction) 03/12/2015  . GERD (gastroesophageal reflux disease) 10/07/2014  . Insomnia 08/04/2014  . Dyspnea on exertion 08/04/2014  . Vitamin D deficiency 01/22/2013  . Microcytic anemia 01/02/2012  . Hypomagnesemia 01/02/2012  . Other specified disorders of adrenal gland ([place]) 09/24/2011  . Obstructive sleep apnea syndrome 08/06/2011  . Asthma, mild persistent  08/06/2011  . Insulin-requiring or dependent type II diabetes mellitus ([place]) 08/06/2011  . Chronic kidney disease, stage 4 (severe) ([place]) 08/06/2011  . History of gastroesophageal reflux (GERD) 08/06/2011  . Benign hypertension with CKD (chronic kidney disease) stage IV ([place]) 08/06/2011   Current Outpatient Medications on File Prior to Visit  Medication Sig Dispense Refill  . albuterol (PROVENTIL) (2.5 MG/3ML) 0.083% nebulizer solution Take 2.5 mg by nebulization every 6 (six) hours as needed for wheezing or shortness of breath.    . allopurinol (ZYLOPRIM) 100 MG tablet Take 100 mg by mouth daily.  3  . aspirin 81 MG tablet Take 81 mg by mouth at bedtime.     [name] atorvastatin (LIPITOR) 80 MG tablet TAKE 1 TABLET (80 MG TOTAL) BY MOUTH AT BEDTIME. 90 tablet 1  . B-D UF III MINI PEN NEEDLES 31G X 5 MM MISC   6  . budesonide-formoterol (SYMBICORT) 80-4.5 MCG/ACT inhaler SMARTSIG:2 Puff(s) By Mouth Twice Daily    . carvedilol (COREG) 25 MG tablet TAKE 1 TABLET (25 MG TOTAL) BY MOUTH 2 (TWO) TIMES DAILY WITH A MEAL. 180 tablet 1  . Cholecalciferol (VITAMIN D) 2000 UNITS CAPS Take 1 capsule by mouth daily.    . cloNIDine (CATAPRES) 0.2 MG tablet Take 0.2 mg by mouth 3 (three) times daily.     . Cobalamin Combinations (B12 FOLATE PO) Take 800 mcg by mouth daily.    . Coenzyme Q10 (COQ10) 200 MG CAPS Take 1 capsule by mouth daily.    . Continuous Blood Gluc Sensor (FREESTYLE LIBRE 14 DAY SENSOR) MISC USE every 14 DAYS as directed    . ezetimibe (ZETIA) 10 MG tablet TAKE 1 TABLET (10 MG TOTAL) BY  MOUTH DAILY. 90 tablet 1  . FEROSUL 325 (65 Fe) MG tablet Take 325 mg by mouth 2 (two) times daily.    . hydrALAZINE (APRESOLINE) 10 MG tablet Take 10 mg by mouth 3 (three) times daily.    . insulin lispro (HUMALOG) 100 UNIT/ML injection Inject into the skin.    [name] isosorbide mononitrate (IMDUR) 120 MG 24 hr tablet TAKE 1 TABLET (120 MG TOTAL) BY MOUTH DAILY 90 tablet 1  . montelukast (SINGULAIR) 10 MG tablet  Take 1 tablet (10 mg total) by mouth daily. 30 tablet 5  . Multiple Vitamin (MULTIVITAMIN) tablet Take 1 tablet by mouth daily.    . Multiple Vitamins-Minerals (PRESERVISION AREDS 2 PO) Take 1 capsule by mouth 2 (two) times daily.     . nitroGLYCERIN (NITROSTAT) 0.4 MG SL tablet Place 0.4 mg under the tongue every 5 (five) minutes as needed for chest pain.    [name] omeprazole (PRILOSEC) 40 MG capsule Take 40 mg by mouth daily.    . potassium chloride (KLOR-CON) 10 MEQ tablet Take 10 mEq by mouth every morning.    [name] QUEtiapine (SEROQUEL) 50 MG tablet Take 50 mg by mouth at bedtime.    . sertraline (ZOLOFT) 25 MG tablet Take 25 mg by mouth daily.    [name] sulfamethoxazole-trimethoprim (BACTRIM) 400-80 MG tablet Take 1 tablet by mouth 2 (two) times daily. 28 tablet 0  . torsemide (DEMADEX) 20 MG tablet Take 20 mg by mouth in the morning, at noon, and at bedtime.      No current facility-administered medications on file prior to visit.   Allergies  Allergen Reactions  . Compazine [Prochlorperazine Edisylate] Other (See Comments)    Stroke like symptoms  . Colcrys [Colchicine] Diarrhea  . Hydrocodone   . Codeine Nausea And Vomiting    Recent Results (from the past 2160 hour(s))  WOUND CULTURE     Status: Abnormal   Collection Time: 01/06/20  2:13 PM   Specimen: Foot, Right; Wound   WOUND CULTURE AND SENS  Result Value Ref Range   Gram Stain Result Final report    Organism ID, Bacteria Comment     Comment: No white blood cells seen.   Organism ID, Bacteria No organisms seen    Aerobic Bacterial Culture Final report (A)    Organism ID, Bacteria Staphylococcus aureus (A)     Comment: Heavy growth Based on susceptibility to oxacillin this isolate would be susceptible to: *Penicillinase-stable penicillins, such as:   Cloxacillin, Dicloxacillin, Nafcillin *Beta-lactam combination agents, such as:   Amoxicillin-clavulanic acid, Ampicillin-sulbactam,   Piperacillin-tazobactam *Oral cephems, such  as:   Cefaclor, Cefdinir, Cefpodoxime, Cefprozil, Cefuroxime,   Cephalexin, Loracarbef *Parenteral cephems, such as:   Cefazolin, Cefepime, Cefotaxime, Cefotetan, Ceftaroline,   Ceftizoxime, Ceftriaxone, Cefuroxime *Carbapenems, such as:   Doripenem, Ertapenem, Imipenem, Meropenem    Antimicrobial Susceptibility Comment     Comment:       ** S = Susceptible; I = Intermediate; R = Resistant **                    P = Positive; N = Negative             MICS are expressed in micrograms per mL    Antibiotic                 RSLT#1    RSLT#2    RSLT#3    RSLT#4 Ciprofloxacin  S Clindamycin                    R Erythromycin                   R Gentamicin                     S Levofloxacin                   S Linezolid                      S Moxifloxacin                   S Oxacillin                      S Penicillin                     R Quinupristin/Dalfopristin      S Rifampin                       S Tetracycline                   S Trimethoprim/Sulfa             S Vancomycin                     S     Objective: There were no vitals filed for this visit.  General: Patient is awake, alert, oriented x 3 and in no acute distress.  Dermatology: Skin is warm and dry bilateral with a partial thickness ulceration present right plantar medial hallux ulceration measures 0.9 cm x 0.2 cm x 0.1 cm. There is a keratotic border with a granular base. The ulceration does not  probe to bone. There is no malodor, no active drainage, blanchable erythema, mild edema. No acute signs of infection.  Severely thickened bilateral hallux toenails likely consistent with onychomycosis versus traumatic nail changes secondary to foot type and bunions   Vascular: Dorsalis Pedis pulse = 1/4 Bilateral,  Posterior Tibial pulse = 0/4 Bilateral,  Capillary Fill Time < 5 seconds, 1+ pitting edema bilateral  Neurologic: Protective sensation severely diminished bilateral with Weinstein  monofilament  Musculosketal:  No Pain with palpation to ulcerated area.  Significant bunion hammertoe and pes planus deformity bilateral right greater than left.  No results for input(s): GRAMSTAIN, LABORGA in the last 8760 hours.  Assessment and Plan:  Problem List Items Addressed This Visit    None    Visit Diagnoses    Skin ulcer of toe of right foot, limited to breakdown of skin ([place])    -  Primary   PAD (peripheral artery disease) ([place])       Type 2 diabetes mellitus with diabetic neuropathy, unspecified whether long term insulin use ([place])           -Examined patient and discussed the progression of the wound and treatment alternatives. - Excisionally dedbrided ulceration at right great toe to healthy bleeding borders removing nonviable tissue using a sterile chisel blade. Wound measures post debridement as above. Wound was debrided to the level of the dermis with viable wound base exposed to promote healing. Hemostasis was achieved with manuel pressure. Patient tolerated procedure well without any discomfort or anesthesia necessary for this wound debridement.  -Continue taking Omnicef until completed  which she is in today -Applied Iodosorb and dry sterile dressing and instructed patient to continue with daily dressings at home consisting of same and to use tube foam to offload the area like before to decrease pressure to the first toe - Advised patient to go to the ER or return to office if the wound worsens or if constitutional symptoms are present. -Patient has vascular follow-up with Dr. Fletcher Anon on 02/01/2020 -Advised patient again that we will hold off on worrying about any treatment for nail fungus for this time until we get her toe ulcer healed; patient expressed understanding -Patient to return to office in 2 weeks for follow up care and evaluation or sooner if problems arise.  Landis Martins, DPM

## 2020-01-21 NOTE — Telephone Encounter (Signed)
Left message for pt to call for results  

## 2020-01-21 NOTE — Telephone Encounter (Signed)
I spoke with pt and informed of Dr. Leeanne Rio review of results. Pt states she has an appt with Dr. Fletcher Anon on 02/01/2020 and appt with Dr. Cannon Kettle today at 10:45am.

## 2020-01-27 ENCOUNTER — Other Ambulatory Visit: Payer: Self-pay | Admitting: Sports Medicine

## 2020-01-27 ENCOUNTER — Telehealth: Payer: Self-pay

## 2020-01-27 MED ORDER — IODOSORB 0.9 % EX GEL: 1.0000 "application " | Freq: Every day | CUTANEOUS | 0 refills | Status: DC | PRN

## 2020-01-27 NOTE — Telephone Encounter (Signed)
Pt called and LVM stating she completed her oral abx, pt would like to know if she would need some more. Pt also stated she needs more of the ointment that she was Rx to apply to her ulcer area.

## 2020-01-27 NOTE — Progress Notes (Signed)
Sent Iodosorb to pharmacy -Dr. Cannon Kettle

## 2020-01-27 NOTE — Telephone Encounter (Signed)
She does not need any more oral antibiotics at this time. I have sent a refill for the iodosorb medication for her to use on her wound to her pharmacy -Dr. Cannon Kettle

## 2020-01-27 NOTE — Telephone Encounter (Signed)
LVM to pt stating that per Dr. Cannon Kettle she does not need any more oral Abx but a new Rx for iodosorb was sent to her pharmacy

## 2020-02-01 ENCOUNTER — Ambulatory Visit: Payer: PPO | Admitting: Cardiovascular Disease

## 2020-02-01 ENCOUNTER — Encounter: Payer: Self-pay | Admitting: Cardiovascular Disease

## 2020-02-01 ENCOUNTER — Other Ambulatory Visit: Payer: Self-pay

## 2020-02-01 VITALS — BP 144/71 | HR 67 | Temp 97.1°F | Resp 14 | Ht 67.5 in | Wt 168.9 lb

## 2020-02-01 DIAGNOSIS — I2510 Atherosclerotic heart disease of native coronary artery without angina pectoris: Secondary | ICD-10-CM

## 2020-02-01 DIAGNOSIS — E785 Hyperlipidemia, unspecified: Secondary | ICD-10-CM

## 2020-02-01 DIAGNOSIS — I10 Essential (primary) hypertension: Secondary | ICD-10-CM | POA: Diagnosis not present

## 2020-02-01 DIAGNOSIS — I739 Peripheral vascular disease, unspecified: Secondary | ICD-10-CM

## 2020-02-01 NOTE — Progress Notes (Signed)
Cardiology Office Note   Date:  02/01/2020   ID:  [name], DOB 06-24-1941, MRN [id number]  PCP:  [name], MD  Cardiologist:  Dr. Bettina Gavia  No chief complaint on file.     History of Present Illness: Leah Walsh is a 79 y.o. female who was referred by Dr. Cannon Kettle for evaluation and management of peripheral arterial disease. She has known history of coronary artery disease, hypertensive heart and kidney disease with stage IV chronic kidney disease, anemia of chronic disease, diabetes mellitus, renal artery stenosis and hyperlipidemia. The patient has an ulceration on the right great toe.  Noninvasive vascular studies last month showed an ABI of 0.99 on the right and 0.92 on the left.  Toe pressure on the right was 97 with an index of 0.68. Duplex showed borderline significant right common iliac artery disease with peak velocity of 307, moderate right popliteal artery stenosis and three-vessel runoff below the knee.  The patient initially developed a blister on the right big toe after she wore new shoes.  Next developed into an ulceration that has not healed in more than 6 weeks.  She has no significant discomfort and she has no claudication.  She denies chest pain or worsening dyspnea.  No palpitations.  Past Medical History:  Diagnosis Date  . Acute renal failure superimposed on stage 4 chronic kidney disease ([place]) 11/24/2018  . Acute Respiratory failure with hypoxia ([place]) 11/28/2018  . AKI (acute kidney injury) ([place]) 12/21/2018  . Anemia in stage 4 chronic kidney disease ([place]) 12/17/2018  . Asthma   . Asthma, mild persistent 08/06/2011  . Benign hypertension with CKD (chronic kidney disease) stage IV ([place]) 08/06/2011  . CAP (community acquired pneumonia) 11/24/2018  . Chronic diastolic heart failure ([place]) 12/16/2018   Is admitted to [facility] [address] 11/23/2020 12/03/2018 with community-acquired pneumonia.  Her case was complicated by severe anemia hemoglobin 6.9 transfused  8.3 and acute superimposed on stage IV CKD.  In hospital she developed volume overload with her weight rising 3 kg in overt congestive heart failure and required IV diuretics.  Although she was volume overloaded and radiographically was in  . Chronic kidney disease, stage 4 (severe) ([place]) 08/06/2011  . Coronary artery disease involving native coronary artery of native heart without angina pectoris 07/03/2017   1. ASHD (arteriosclerotic heart disease)  2. Old anteroseptal myocardial infarction  3. Resolute DES to LAD 04/2013   Review of records from care everywhere [facility] regional cardiology in [place] show a history of known CAD previous anterior septal myocardial infarction and PCI and stent LAD drug-eluting in September 2014.  . Diabetes ([place])   . Dyspnea on exertion 08/04/2014   History of coronary disease/MI/stent PFT 11/07/2014-moderate diffusion defect 60% predicted with normal lung volumes and spirometry flows.   . Elevated troponin 11/24/2018   Low level 0.8 with pneumonia anemia and severe CKD  . GERD (gastroesophageal reflux disease) 10/07/2014  . Heart attack ([place]) 2014  . History of gastroesophageal reflux (GERD) 08/06/2011  . Hyperlipidemia   . Hyperphosphatemia 08/22/2016  . Hypertension   . Hypertensive heart and kidney disease with chronic diastolic congestive heart failure and stage 4 chronic kidney disease ([place]) 11/24/2018  . Hypokalemia 02/17/2019  . Hypomagnesemia 01/02/2012  . Hyponatremia 11/24/2018  . Insomnia 08/04/2014  . Insulin-requiring or dependent type II diabetes mellitus ([place]) 08/06/2011  . Iron deficiency anemia 06/18/2018  . Kidney disease   . Metabolic bone disease 5/88/5027  . Microcytic anemia 01/02/2012  .  Nausea vomiting and diarrhea 11/24/2018  . Obstructive sleep apnea syndrome 08/06/2011   NPSG 12/05/10([place]) AHI 14.2/ hr, weight 178 lbs.  . Old MI (myocardial infarction) 03/12/2015   Stent  . Other specified disorders of adrenal gland ([place]) 09/24/2011  .  Pelvic mass 12/20/2016   Overview:  Added automatically from request for surgery [id number]  Formatting of this note might be different from the original. Added automatically from request for surgery [id number]  . Renal artery stenosis ([place]) 12/03/2018   She had a duplex in September 2019, report is not in Epic but described in office note November 2019: "She does have probably atherosclerotic occlusion of the left renal artery with a small atrophic left kidney canal right renal artery has a proximal stenosis there is clearly hemodynamically significant. With her last 2 renal duplex study showing velocities of 340 and then a little bit less than 2  . Sleep apnea    Using CPAP  . Vitamin D deficiency 01/22/2013    Past Surgical History:  Procedure Laterality Date  . ABDOMINAL HYSTERECTOMY    . CAROTID STENT    . CHOLECYSTECTOMY    . TUBAL LIGATION    . 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 VESICOVAGINAL FISTULA CLOSURE W/ TAH       Current Outpatient Medications  Medication Sig Dispense Refill  . albuterol (PROVENTIL) (2.5 MG/3ML) 0.083% nebulizer solution Take 2.5 mg by nebulization every 6 (six) hours as needed for wheezing or shortness of breath.    . allopurinol (ZYLOPRIM) 100 MG tablet Take 100 mg by mouth daily.  3  . aspirin 81 MG tablet Take 81 mg by mouth at bedtime.     [name] atorvastatin (LIPITOR) 80 MG tablet TAKE 1 TABLET (80 MG TOTAL) BY MOUTH AT BEDTIME. 90 tablet 1  . B-D UF III MINI PEN NEEDLES 31G X 5 MM MISC   6  . budesonide-formoterol (SYMBICORT) 80-4.5 MCG/ACT inhaler SMARTSIG:2 Puff(s) By Mouth Twice Daily    . cadexomer iodine (IODOSORB) 0.9 % gel Apply 1 application topically daily as needed for wound care. 40 g 0  . carvedilol (COREG) 25 MG tablet TAKE 1 TABLET (25 MG TOTAL) BY MOUTH 2 (TWO) TIMES DAILY WITH A MEAL. 180 tablet 1  . Cholecalciferol (VITAMIN D) 2000 UNITS CAPS Take 1 capsule by mouth daily.    . cloNIDine (CATAPRES) 0.2 MG tablet Take 0.2 mg by mouth 3  (three) times daily.     . Cobalamin Combinations (B12 FOLATE PO) Take 800 mcg by mouth daily.    . Coenzyme Q10 (COQ10) 200 MG CAPS Take 1 capsule by mouth daily.    . Continuous Blood Gluc Sensor (FREESTYLE LIBRE 14 DAY SENSOR) MISC USE every 14 DAYS as directed    . ezetimibe (ZETIA) 10 MG tablet TAKE 1 TABLET (10 MG TOTAL) BY MOUTH DAILY. 90 tablet 1  . FEROSUL 325 (65 Fe) MG tablet Take 325 mg by mouth 2 (two) times daily.    . hydrALAZINE (APRESOLINE) 50 MG tablet Take 50 mg by mouth 2 (two) times daily.    . insulin lispro (HUMALOG) 100 UNIT/ML injection Inject into the skin.    [name] isosorbide mononitrate (IMDUR) 120 MG 24 hr tablet TAKE 1 TABLET (120 MG TOTAL) BY MOUTH DAILY 90 tablet 1  . montelukast (SINGULAIR) 10 MG tablet Take 1 tablet (10 mg total) by mouth daily. 30 tablet 5  . Multiple Vitamin (MULTIVITAMIN) tablet Take 1 tablet by mouth daily.    [name]  Multiple Vitamins-Minerals (PRESERVISION AREDS 2 PO) Take 1 capsule by mouth 2 (two) times daily.     . nitroGLYCERIN (NITROSTAT) 0.4 MG SL tablet Place 0.4 mg under the tongue every 5 (five) minutes as needed for chest pain.    [name] omeprazole (PRILOSEC) 40 MG capsule Take 40 mg by mouth daily.    . potassium chloride (KLOR-CON) 10 MEQ tablet Take 10 mEq by mouth every morning.    . potassium chloride (KLOR-CON) 10 MEQ tablet Take 10 mEq by mouth every morning.    [name] QUEtiapine (SEROQUEL) 50 MG tablet Take 50 mg by mouth at bedtime.    . risperiDONE (RISPERDAL) 0.25 MG tablet Take 0.25 mg by mouth daily.    . sertraline (ZOLOFT) 25 MG tablet Take 25 mg by mouth daily.    [name] sulfamethoxazole-trimethoprim (BACTRIM) 400-80 MG tablet Take 1 tablet by mouth 2 (two) times daily. 28 tablet 0  . torsemide (DEMADEX) 20 MG tablet Take 20 mg by mouth in the morning, at noon, and at bedtime.     [name] trimethoprim-polymyxin b (POLYTRIM) ophthalmic solution instill ONE DROP IN THE RIGHT EYE FOUR TIMES DAILY FOR 2 DAYS AFTER each monthly eye injection    .  vitamin B-12 (CYANOCOBALAMIN) 1000 MCG tablet Take 1,000 mcg by mouth every morning.     No current facility-administered medications for this visit.    Allergies:   Compazine [prochlorperazine edisylate], Colcrys [colchicine], Hydrocodone, and Codeine    Social History:  The patient  reports that she has never smoked. She has never used smokeless tobacco. She reports that she does not drink alcohol or use drugs.   Family History:  The patient's family history includes Diabetes in her sister and sister; Heart disease in her father, maternal grandfather, maternal grandmother, mother, paternal grandfather, and paternal grandmother; Lung cancer in her brother.    ROS:  Please see the history of present illness.   Otherwise, review of systems are positive for none.   All other systems are reviewed and negative.    PHYSICAL EXAM: VS:  BP (!) 144/71   Pulse 67   Temp (!) 97.1 F (36.2 C)   Resp 14   Ht 5' 7.5" (1.715 m)   Wt 168 lb 14.4 oz (76.6 kg)   SpO2 96%   BMI 26.06 kg/m  , BMI Body mass index is 26.06 kg/m. GEN: Well nourished, well developed, in no acute distress  HEENT: normal  Neck: no JVD, carotid bruits, or masses Cardiac: RRR; no murmurs, rubs, or gallops, mild bilateral leg edema Respiratory:  clear to auscultation bilaterally, normal work of breathing GI: soft, nontender, nondistended, + BS MS: no deformity or atrophy  Skin: warm and dry, no rash Neuro:  Strength and sensation are intact Psych: euthymic mood, full affect Vascular: Femoral pulses +1 on the right and +2 on the left.  Posterior tibial is +1 bilaterally.  Dorsalis pedis is not palpable.  There is a 5 to 6 mm wound on the bottom of the right big toe.  EKG:  EKG is ordered today. The ekg ordered today demonstrates normal sinus rhythm with minimal LVH.   Recent Labs: No results found for requested labs within last 8760 hours.    Lipid Panel No results found for: CHOL, TRIG, HDL, CHOLHDL, VLDL,  LDLCALC, LDLDIRECT    Wt Readings from Last 3 Encounters:  02/01/20 168 lb 14.4 oz (76.6 kg)  12/21/19 164 lb (74.4 kg)  12/07/19 162 lb 6.4 oz (73.7 kg)  No flowsheet data found.    ASSESSMENT AND PLAN:  1.  Peripheral arterial disease with nonhealing ulceration on the bottom of the right big toe: The patient vascular evaluation reveals borderline iliac disease bilaterally worse on the right side.  In addition, I suspect that she has small vessel disease.  Although her ABIs not critical, she might not be getting blood flow directly to the ulcer area given that her dorsalis pedis is not palpable. Due to this, I have recommended proceeding with abdominal aortogram with lower extremity runoff and possible endovascular intervention with plans to use CO2 for the majority of the case but I did explain to her that contrast most of the time is also required although we use less.  She does have advanced chronic kidney disease and she is extremely concerned about the possibility of contrast-induced nephropathy.  She wants to discuss with her nephrologist before proceeding which I think is reasonable.  Typically, we do pre and post procedure hydration and as mentioned above we will use CO2 for the majority of the procedure. She is going to follow-up with me in 3 weeks.  2.  Coronary artery disease involving native coronary arteries without angina: Stable from a cardiac standpoint and followed by Dr. Bettina Gavia.  3.  Essential hypertension: Blood pressure is reasonably controlled.  4.  Hyperlipidemia: Currently on atorvastatin 80 mg daily.  Most recent lipid profile showed an LDL of 63.   Disposition:   FU with me in 3 weeks  Signed,  Kathlyn Sacramento, MD  02/01/2020 5:18 PM    [facility]

## 2020-02-01 NOTE — Patient Instructions (Signed)
Medication Instructions:  The current medical regimen is effective;  continue present plan and medications.  *If you need a refill on your cardiac medications before your next appointment, please call your pharmacy*   Follow-Up: At [facility], you and your health needs are our priority.  As part of our continuing mission to provide you with exceptional heart care, we have created designated Provider Care Teams.  These Care Teams include your primary Cardiologist (physician) and Advanced Practice Providers (APPs -  Physician Assistants and Nurse Practitioners) who all work together to provide you with the care you need, when you need it.  We recommend signing up for the patient portal called "MyChart".  Sign up information is provided on this After Visit Summary.  MyChart is used to connect with patients for Virtual Visits (Telemedicine).  Patients are able to view lab/test results, encounter notes, upcoming appointments, etc.  Non-urgent messages can be sent to your provider as well.   To learn more about what you can do with MyChart, go to [email or web address].    Your next appointment:   3 week(s)  The format for your next appointment:   In Person  Provider:   Kathlyn Sacramento, MD

## 2020-02-03 ENCOUNTER — Encounter: Payer: Self-pay | Admitting: Sports Medicine

## 2020-02-03 ENCOUNTER — Ambulatory Visit: Payer: PPO | Admitting: Sports Medicine

## 2020-02-03 ENCOUNTER — Other Ambulatory Visit: Payer: Self-pay

## 2020-02-03 DIAGNOSIS — L97511 Non-pressure chronic ulcer of other part of right foot limited to breakdown of skin: Secondary | ICD-10-CM

## 2020-02-03 DIAGNOSIS — E1140 Type 2 diabetes mellitus with diabetic neuropathy, unspecified: Secondary | ICD-10-CM

## 2020-02-03 DIAGNOSIS — I739 Peripheral vascular disease, unspecified: Secondary | ICD-10-CM

## 2020-02-03 NOTE — Progress Notes (Signed)
Subjective: Leah Walsh is a 79 y.o. female patient seen in office for follow-up evaluation of ulceration of the right great toe. Patient has a history of diabetes and a blood glucose level today of 99mg /dl, last A1c 7.4.   Patient is changing the dressing using Iodosorb with the help of her husband and thinks it may be a little worse, denies nausea/fever/vomiting/chills/night sweats/shortness of breath/pain.   Patient has no other pedal complaints at this time.  Patient Active Problem List   Diagnosis Date Noted  . Hypokalemia 02/17/2019  . AKI (acute kidney injury) ([place]) 12/21/2018  . Anemia in stage 4 chronic kidney disease ([place]) 12/17/2018  . Chronic diastolic heart failure ([place]) 12/16/2018  . Renal artery stenosis ([place]) 12/03/2018  . Acute Respiratory failure with hypoxia ([place]) 11/28/2018  . Acute renal failure superimposed on stage 4 chronic kidney disease ([place]) 11/24/2018  . CAP (community acquired pneumonia) 11/24/2018  . Hypertensive heart and kidney disease with chronic diastolic congestive heart failure and stage 4 chronic kidney disease ([place]) 11/24/2018  . Hyponatremia 11/24/2018  . Nausea vomiting and diarrhea 11/24/2018  . Elevated troponin 11/24/2018  . Iron deficiency anemia 06/18/2018  . Coronary artery disease involving native coronary artery of native heart without angina pectoris 07/03/2017  . Pelvic mass 12/20/2016  . Hyperphosphatemia 08/22/2016  . Metabolic bone disease 01/60/1093  . Old MI (myocardial infarction) 03/12/2015  . GERD (gastroesophageal reflux disease) 10/07/2014  . Insomnia 08/04/2014  . Dyspnea on exertion 08/04/2014  . Vitamin D deficiency 01/22/2013  . Microcytic anemia 01/02/2012  . Hypomagnesemia 01/02/2012  . Other specified disorders of adrenal gland ([place]) 09/24/2011  . Obstructive sleep apnea syndrome 08/06/2011  . Asthma, mild persistent 08/06/2011  . Insulin-requiring or dependent type II diabetes mellitus ([place]) 08/06/2011  . Chronic  kidney disease, stage 4 (severe) ([place]) 08/06/2011  . History of gastroesophageal reflux (GERD) 08/06/2011  . Benign hypertension with CKD (chronic kidney disease) stage IV ([place]) 08/06/2011   Current Outpatient Medications on File Prior to Visit  Medication Sig Dispense Refill  . albuterol (PROVENTIL) (2.5 MG/3ML) 0.083% nebulizer solution Take 2.5 mg by nebulization every 6 (six) hours as needed for wheezing or shortness of breath.    . allopurinol (ZYLOPRIM) 100 MG tablet Take 100 mg by mouth daily.  3  . aspirin 81 MG tablet Take 81 mg by mouth at bedtime.     [name] atorvastatin (LIPITOR) 80 MG tablet TAKE 1 TABLET (80 MG TOTAL) BY MOUTH AT BEDTIME. 90 tablet 1  . B-D UF III MINI PEN NEEDLES 31G X 5 MM MISC   6  . budesonide-formoterol (SYMBICORT) 80-4.5 MCG/ACT inhaler SMARTSIG:2 Puff(s) By Mouth Twice Daily    . cadexomer iodine (IODOSORB) 0.9 % gel Apply 1 application topically daily as needed for wound care. 40 g 0  . carvedilol (COREG) 25 MG tablet TAKE 1 TABLET (25 MG TOTAL) BY MOUTH 2 (TWO) TIMES DAILY WITH A MEAL. 180 tablet 1  . Cholecalciferol (VITAMIN D) 2000 UNITS CAPS Take 1 capsule by mouth daily.    . cloNIDine (CATAPRES) 0.2 MG tablet Take 0.2 mg by mouth 3 (three) times daily.     . Cobalamin Combinations (B12 FOLATE PO) Take 800 mcg by mouth daily.    . Coenzyme Q10 (COQ10) 200 MG CAPS Take 1 capsule by mouth daily.    . Continuous Blood Gluc Sensor (FREESTYLE LIBRE 14 DAY SENSOR) MISC USE every 14 DAYS as directed    . ezetimibe (ZETIA) 10 MG tablet TAKE  1 TABLET (10 MG TOTAL) BY MOUTH DAILY. 90 tablet 1  . FEROSUL 325 (65 Fe) MG tablet Take 325 mg by mouth 2 (two) times daily.    . hydrALAZINE (APRESOLINE) 50 MG tablet Take 50 mg by mouth 2 (two) times daily.    . insulin lispro (HUMALOG) 100 UNIT/ML injection Inject into the skin.    [name] isosorbide mononitrate (IMDUR) 120 MG 24 hr tablet TAKE 1 TABLET (120 MG TOTAL) BY MOUTH DAILY 90 tablet 1  . montelukast (SINGULAIR) 10 MG  tablet Take 1 tablet (10 mg total) by mouth daily. 30 tablet 5  . Multiple Vitamin (MULTIVITAMIN) tablet Take 1 tablet by mouth daily.    . Multiple Vitamins-Minerals (PRESERVISION AREDS 2 PO) Take 1 capsule by mouth 2 (two) times daily.     . nitroGLYCERIN (NITROSTAT) 0.4 MG SL tablet Place 0.4 mg under the tongue every 5 (five) minutes as needed for chest pain.    [name] omeprazole (PRILOSEC) 40 MG capsule Take 40 mg by mouth daily.    . potassium chloride (KLOR-CON) 10 MEQ tablet Take 10 mEq by mouth every morning.    . potassium chloride (KLOR-CON) 10 MEQ tablet Take 10 mEq by mouth every morning.    [name] QUEtiapine (SEROQUEL) 50 MG tablet Take 50 mg by mouth at bedtime.    . risperiDONE (RISPERDAL) 0.25 MG tablet Take 0.25 mg by mouth daily.    . sertraline (ZOLOFT) 25 MG tablet Take 25 mg by mouth daily.    [name] sulfamethoxazole-trimethoprim (BACTRIM) 400-80 MG tablet Take 1 tablet by mouth 2 (two) times daily. 28 tablet 0  . torsemide (DEMADEX) 20 MG tablet Take 20 mg by mouth in the morning, at noon, and at bedtime.     [name] trimethoprim-polymyxin b (POLYTRIM) ophthalmic solution instill ONE DROP IN THE RIGHT EYE FOUR TIMES DAILY FOR 2 DAYS AFTER each monthly eye injection    . vitamin B-12 (CYANOCOBALAMIN) 1000 MCG tablet Take 1,000 mcg by mouth every morning.     No current facility-administered medications on file prior to visit.   Allergies  Allergen Reactions  . Compazine [Prochlorperazine Edisylate] Other (See Comments)    Stroke like symptoms  . Colcrys [Colchicine] Diarrhea  . Hydrocodone   . Codeine Nausea And Vomiting    Recent Results (from the past 2160 hour(s))  WOUND CULTURE     Status: Abnormal   Collection Time: 01/06/20  2:13 PM   Specimen: Foot, Right; Wound   WOUND CULTURE AND SENS  Result Value Ref Range   Gram Stain Result Final report    Organism ID, Bacteria Comment     Comment: No white blood cells seen.   Organism ID, Bacteria No organisms seen    Aerobic  Bacterial Culture Final report (A)    Organism ID, Bacteria Staphylococcus aureus (A)     Comment: Heavy growth Based on susceptibility to oxacillin this isolate would be susceptible to: *Penicillinase-stable penicillins, such as:   Cloxacillin, Dicloxacillin, Nafcillin *Beta-lactam combination agents, such as:   Amoxicillin-clavulanic acid, Ampicillin-sulbactam,   Piperacillin-tazobactam *Oral cephems, such as:   Cefaclor, Cefdinir, Cefpodoxime, Cefprozil, Cefuroxime,   Cephalexin, Loracarbef *Parenteral cephems, such as:   Cefazolin, Cefepime, Cefotaxime, Cefotetan, Ceftaroline,   Ceftizoxime, Ceftriaxone, Cefuroxime *Carbapenems, such as:   Doripenem, Ertapenem, Imipenem, Meropenem    Antimicrobial Susceptibility Comment     Comment:       ** S = Susceptible; I = Intermediate; R = Resistant **  P = Positive; N = Negative             MICS are expressed in micrograms per mL    Antibiotic                 RSLT#1    RSLT#2    RSLT#3    RSLT#4 Ciprofloxacin                  S Clindamycin                    R Erythromycin                   R Gentamicin                     S Levofloxacin                   S Linezolid                      S Moxifloxacin                   S Oxacillin                      S Penicillin                     R Quinupristin/Dalfopristin      S Rifampin                       S Tetracycline                   S Trimethoprim/Sulfa             S Vancomycin                     S     Objective: There were no vitals filed for this visit.  General: Patient is awake, alert, oriented x 3 and in no acute distress.  Dermatology: Skin is warm and dry bilateral with a partial thickness ulceration present right plantar medial hallux ulceration measures 0.7cm x 0.2 cm x 0.1 cm. There is a keratotic border with a granular base. The ulceration does not  probe to bone. There is no malodor, no active drainage, blanchable erythema, mild edema. No acute  signs of infection.  Severely thickened bilateral hallux toenails likely consistent with onychomycosis versus traumatic nail changes secondary to foot type and bunions   Vascular: Dorsalis Pedis pulse = 1/4 Bilateral,  Posterior Tibial pulse = 0/4 Bilateral,  Capillary Fill Time < 5 seconds, 1+ pitting edema bilateral  Neurologic: Protective sensation severely diminished bilateral with Weinstein monofilament  Musculosketal:  No Pain with palpation to ulcerated area.  Significant bunion hammertoe and pes planus deformity bilateral right greater than left.  No results for input(s): GRAMSTAIN, LABORGA in the last 8760 hours.  Assessment and Plan:  Problem List Items Addressed This Visit    None    Visit Diagnoses    Skin ulcer of toe of right foot, limited to breakdown of skin ([place])    -  Primary   PAD (peripheral artery disease) ([place])       Type 2 diabetes mellitus with diabetic neuropathy, unspecified whether long term insulin use ([place])         -Examined patient and discussed the progression of the  wound and treatment alternatives. - Excisionally dedbrided ulceration at right great toe to healthy bleeding borders removing nonviable tissue using a sterile chisel blade. Wound measures post debridement as above. Wound was debrided to the level of the dermis with viable wound base exposed to promote healing. Hemostasis was achieved with manuel pressure. Patient tolerated procedure well without any discomfort or anesthesia necessary for this wound debridement.  -Applied PRISMA and dry sterile dressing and instructed patient to continue with daily dressings at home consisting of same  -Dispensed Post op shoe - Advised patient to go to the ER or return to office if the wound worsens or if constitutional symptoms are present. -Continue with vascular follow up  -Patient to return to office in 2 weeks for follow up care and evaluation or sooner if problems arise.  Landis Martins, DPM

## 2020-02-07 ENCOUNTER — Encounter (INDEPENDENT_AMBULATORY_CARE_PROVIDER_SITE_OTHER): Payer: PPO | Admitting: Ophthalmology

## 2020-02-07 ENCOUNTER — Other Ambulatory Visit: Payer: Self-pay

## 2020-02-07 DIAGNOSIS — H34831 Tributary (branch) retinal vein occlusion, right eye, with macular edema: Secondary | ICD-10-CM | POA: Diagnosis not present

## 2020-02-07 DIAGNOSIS — I10 Essential (primary) hypertension: Secondary | ICD-10-CM

## 2020-02-07 DIAGNOSIS — H353132 Nonexudative age-related macular degeneration, bilateral, intermediate dry stage: Secondary | ICD-10-CM

## 2020-02-07 DIAGNOSIS — H43813 Vitreous degeneration, bilateral: Secondary | ICD-10-CM | POA: Diagnosis not present

## 2020-02-07 DIAGNOSIS — D3132 Benign neoplasm of left choroid: Secondary | ICD-10-CM | POA: Diagnosis not present

## 2020-02-07 DIAGNOSIS — H35033 Hypertensive retinopathy, bilateral: Secondary | ICD-10-CM | POA: Diagnosis not present

## 2020-02-08 DIAGNOSIS — Z6825 Body mass index (BMI) 25.0-25.9, adult: Secondary | ICD-10-CM | POA: Diagnosis not present

## 2020-02-08 DIAGNOSIS — M109 Gout, unspecified: Secondary | ICD-10-CM | POA: Diagnosis not present

## 2020-02-08 DIAGNOSIS — Z79899 Other long term (current) drug therapy: Secondary | ICD-10-CM | POA: Diagnosis not present

## 2020-02-10 ENCOUNTER — Emergency Department (HOSPITAL_BASED_OUTPATIENT_CLINIC_OR_DEPARTMENT_OTHER)
Admission: EM | Admit: 2020-02-10 | Discharge: 2020-02-10 | Disposition: A | Discharge status: Home / Self Care | Payer: PPO | Attending: Emergency Medicine | Admitting: Emergency Medicine

## 2020-02-10 ENCOUNTER — Other Ambulatory Visit: Payer: Self-pay

## 2020-02-10 ENCOUNTER — Telehealth: Payer: Self-pay | Admitting: Cardiology

## 2020-02-10 ENCOUNTER — Encounter (HOSPITAL_BASED_OUTPATIENT_CLINIC_OR_DEPARTMENT_OTHER): Payer: Self-pay | Admitting: Emergency Medicine

## 2020-02-10 ENCOUNTER — Emergency Department (HOSPITAL_BASED_OUTPATIENT_CLINIC_OR_DEPARTMENT_OTHER): Payer: PPO

## 2020-02-10 DIAGNOSIS — I5033 Acute on chronic diastolic (congestive) heart failure: Secondary | ICD-10-CM | POA: Insufficient documentation

## 2020-02-10 DIAGNOSIS — Z794 Long term (current) use of insulin: Secondary | ICD-10-CM | POA: Insufficient documentation

## 2020-02-10 DIAGNOSIS — N185 Chronic kidney disease, stage 5: Secondary | ICD-10-CM | POA: Insufficient documentation

## 2020-02-10 DIAGNOSIS — I130 Hypertensive heart and chronic kidney disease with heart failure and stage 1 through stage 4 chronic kidney disease, or unspecified chronic kidney disease: Secondary | ICD-10-CM | POA: Diagnosis not present

## 2020-02-10 DIAGNOSIS — J45909 Unspecified asthma, uncomplicated: Secondary | ICD-10-CM | POA: Diagnosis not present

## 2020-02-10 DIAGNOSIS — I132 Hypertensive heart and chronic kidney disease with heart failure and with stage 5 chronic kidney disease, or end stage renal disease: Secondary | ICD-10-CM | POA: Diagnosis not present

## 2020-02-10 DIAGNOSIS — R0602 Shortness of breath: Secondary | ICD-10-CM | POA: Diagnosis not present

## 2020-02-10 DIAGNOSIS — Z7982 Long term (current) use of aspirin: Secondary | ICD-10-CM | POA: Diagnosis not present

## 2020-02-10 DIAGNOSIS — E11621 Type 2 diabetes mellitus with foot ulcer: Secondary | ICD-10-CM | POA: Diagnosis not present

## 2020-02-10 DIAGNOSIS — L97519 Non-pressure chronic ulcer of other part of right foot with unspecified severity: Secondary | ICD-10-CM | POA: Insufficient documentation

## 2020-02-10 DIAGNOSIS — I2510 Atherosclerotic heart disease of native coronary artery without angina pectoris: Secondary | ICD-10-CM | POA: Insufficient documentation

## 2020-02-10 DIAGNOSIS — I509 Heart failure, unspecified: Secondary | ICD-10-CM

## 2020-02-10 DIAGNOSIS — J90 Pleural effusion, not elsewhere classified: Secondary | ICD-10-CM | POA: Diagnosis not present

## 2020-02-10 DIAGNOSIS — E1122 Type 2 diabetes mellitus with diabetic chronic kidney disease: Secondary | ICD-10-CM | POA: Insufficient documentation

## 2020-02-10 LAB — CBC WITH DIFFERENTIAL/PLATELET
Abs Immature Granulocytes: 0.06 10*3/uL (ref 0.00–0.07)
Basophils Absolute: 0 10*3/uL (ref 0.0–0.1)
Basophils Relative: 0 %
Eosinophils Absolute: 0.3 10*3/uL (ref 0.0–0.5)
Eosinophils Relative: 3 %
HCT: 27.9 % — ABNORMAL LOW (ref 36.0–46.0)
Hemoglobin: 8.9 g/dL — ABNORMAL LOW (ref 12.0–15.0)
Immature Granulocytes: 1 %
Lymphocytes Relative: 15 %
Lymphs Abs: 1.7 10*3/uL (ref 0.7–4.0)
MCH: 26.6 pg (ref 26.0–34.0)
MCHC: 31.9 g/dL (ref 30.0–36.0)
MCV: 83.5 fL (ref 80.0–100.0)
Monocytes Absolute: 0.7 10*3/uL (ref 0.1–1.0)
Monocytes Relative: 6 %
Neutro Abs: 8.7 10*3/uL — ABNORMAL HIGH (ref 1.7–7.7)
Neutrophils Relative %: 75 %
Platelets: 207 10*3/uL (ref 150–400)
RBC: 3.34 MIL/uL — ABNORMAL LOW (ref 3.87–5.11)
RDW: 16.9 % — ABNORMAL HIGH (ref 11.5–15.5)
WBC: 11.5 10*3/uL — ABNORMAL HIGH (ref 4.0–10.5)
nRBC: 0 % (ref 0.0–0.2)

## 2020-02-10 LAB — BASIC METABOLIC PANEL
Anion gap: 11 (ref 5–15)
BUN: 64 mg/dL — ABNORMAL HIGH (ref 8–23)
CO2: 23 mmol/L (ref 22–32)
Calcium: 8.5 mg/dL — ABNORMAL LOW (ref 8.9–10.3)
Chloride: 105 mmol/L (ref 98–111)
Creatinine, Ser: 3.06 mg/dL — ABNORMAL HIGH (ref 0.44–1.00)
GFR calc Af Amer: 16 mL/min — ABNORMAL LOW (ref >60)
GFR calc non Af Amer: 14 mL/min — ABNORMAL LOW (ref >60)
Glucose, Bld: 209 mg/dL — ABNORMAL HIGH (ref 70–99)
Potassium: 4.1 mmol/L (ref 3.5–5.1)
Sodium: 139 mmol/L (ref 135–145)

## 2020-02-10 LAB — TROPONIN I (HIGH SENSITIVITY): Troponin I (High Sensitivity): 9 ng/L (ref <18)

## 2020-02-10 LAB — BRAIN NATRIURETIC PEPTIDE: B Natriuretic Peptide: 288.7 pg/mL — ABNORMAL HIGH (ref 0.0–100.0)

## 2020-02-10 MED ORDER — FUROSEMIDE 10 MG/ML IJ SOLN
40.0000 mg | Freq: Once | INTRAMUSCULAR | Status: AC
  Administered 2020-02-10: 40 mg via INTRAVENOUS
  Filled 2020-02-10: qty 4

## 2020-02-10 NOTE — ED Triage Notes (Signed)
SOB x 2 days. Hx of CHF. Reports recent 14lb weight gain

## 2020-02-10 NOTE — Telephone Encounter (Signed)
New Message   Pt c/o swelling: STAT is pt has developed SOB within 24 hours  1) How much weight have you gained and in what time span? 162 her normal weight she is now 174   2) If swelling, where is the swelling located? Ankles and legs   3) Are you currently taking a fluid pill? Yes   4) Are you currently SOB? Yes   5) Do you have a log of your daily weights (if so, list)? Yes   6) Have you gained 3 pounds in a day or 5 pounds in a week? Yes   7) Have you traveled recently? No

## 2020-02-10 NOTE — Telephone Encounter (Signed)
Spoke with pt and gave her Dr. Joya Gaskins recommendation. Pt states that her BP is 148/58 HR 83 and Sat 88%. Pt was advised to go to the ED at [facility].

## 2020-02-10 NOTE — ED Notes (Signed)
Pt ambulated in room at 94 %, pt became unsteady while walking

## 2020-02-10 NOTE — Telephone Encounter (Signed)
Pt states that she has been having increased shortness of breath and swelling.  Pt states that she normally weighs 162 and now weighs 174. Pt states that this has been over the last few days.  Pt denies chest pain. Pt states that she has swelling in her legs and ankles. Pt informed to go to the ED if her breathing gets worse.

## 2020-02-10 NOTE — Telephone Encounter (Signed)
I want her to take an extra dose of torsemide today and will work her into the office hours tomorrow she should go to the emergency room at [facility] if she is worsened this evening

## 2020-02-10 NOTE — ED Provider Notes (Signed)
Leah Walsh Provider Note   CSN: [id number] Arrival date & time: 02/10/20  1633     History Chief Complaint  Patient presents with   Shortness of Breath    Leah Walsh is a 79 y.o. female.  Pt presents to the ED today with sob.  Pt has a hx of CHF and has been feeling sob for the past 2 days.  She thinks she's had a 14 lb weight gain in the past week.  Pt denies cp.  Pt does have an ulcer to her right great toe for which she's been seen by cards.  She had an aortogram done in Dr. Tyrell Antonio office just a few days ago. Due to her kidney disease, she had fluids before and after the study.  She has an appt with nephrology this week.        Past Medical History:  Diagnosis Date   Acute renal failure superimposed on stage 4 chronic kidney disease ([place]) 11/24/2018   Acute Respiratory failure with hypoxia ([place]) 11/28/2018   AKI (acute kidney injury) ([place]) 12/21/2018   Anemia in stage 4 chronic kidney disease ([place]) 12/17/2018   Asthma    Asthma, mild persistent 08/06/2011   Benign hypertension with CKD (chronic kidney disease) stage IV ([place]) 08/06/2011   CAP (community acquired pneumonia) 11/24/2018   Chronic diastolic heart failure ([place]) 12/16/2018   Is admitted to [facility] 11/23/2020 12/03/2018 with community-acquired pneumonia.  Her case was complicated by severe anemia hemoglobin 6.9 transfused 8.3 and acute superimposed on stage IV CKD.  In hospital she developed volume overload with her weight rising 3 kg in overt congestive heart failure and required IV diuretics.  Although she was volume overloaded and radiographically was in   Chronic kidney disease, stage 4 (severe) ([place]) 08/06/2011   Coronary artery disease involving native coronary artery of native heart without angina pectoris 07/03/2017   1. ASHD (arteriosclerotic heart disease)  2. Old anteroseptal myocardial infarction  3. Resolute DES to LAD 04/2013   Review of records from  care everywhere [facility] cardiology in [place] show a history of known CAD previous anterior septal myocardial infarction and PCI and stent LAD drug-eluting in September 2014.   Diabetes ([place])    Dyspnea on exertion 08/04/2014   History of coronary disease/MI/stent PFT 11/07/2014-moderate diffusion defect 60% predicted with normal lung volumes and spirometry flows.    Elevated troponin 11/24/2018   Low level 0.8 with pneumonia anemia and severe CKD   GERD (gastroesophageal reflux disease) 10/07/2014   Heart attack ([place]) 2014   History of gastroesophageal reflux (GERD) 08/06/2011   Hyperlipidemia    Hyperphosphatemia 08/22/2016   Hypertension    Hypertensive heart and kidney disease with chronic diastolic congestive heart failure and stage 4 chronic kidney disease ([place]) 11/24/2018   Hypokalemia 02/17/2019   Hypomagnesemia 01/02/2012   Hyponatremia 11/24/2018   Insomnia 08/04/2014   Insulin-requiring or dependent type II diabetes mellitus ([place]) 08/06/2011   Iron deficiency anemia 06/18/2018   Kidney disease    Metabolic bone disease 08/28/7251   Microcytic anemia 01/02/2012   Nausea vomiting and diarrhea 11/24/2018   Obstructive sleep apnea syndrome 08/06/2011   NPSG 12/05/10([place]) AHI 14.2/ hr, weight 178 lbs.   Old MI (myocardial infarction) 03/12/2015   Stent   Other specified disorders of adrenal gland ([place]) 09/24/2011   Pelvic mass 12/20/2016   Overview:  Added automatically from request for surgery [id number]  Formatting of this note might be different from the  original. Added automatically from request for surgery [id number]   Renal artery stenosis ([place]) 12/03/2018   She had a duplex in September 2019, report is not in Epic but described in office note November 2019: "She does have probably atherosclerotic occlusion of the left renal artery with a small atrophic left kidney canal right renal artery has a proximal stenosis there is clearly hemodynamically significant.  With her last 2 renal duplex study showing velocities of 340 and then a little bit less than 2   Sleep apnea    Using CPAP   Vitamin D deficiency 01/22/2013    Patient Active Problem List   Diagnosis Date Noted   Hypokalemia 02/17/2019   AKI (acute kidney injury) ([place]) 12/21/2018   Anemia in stage 4 chronic kidney disease ([place]) 12/17/2018   Chronic diastolic heart failure ([place]) 12/16/2018   Renal artery stenosis (H[facility]) 12/03/2018   Acute Respiratory failure with hypoxia ([place]) 11/28/2018   Acute renal failure superimposed on stage 4 chronic kidney disease ([place]) 11/24/2018   CAP (community acquired pneumonia) 11/24/2018   Hypertensive heart and kidney disease with chronic diastolic congestive heart failure and stage 4 chronic kidney disease ([place]) 11/24/2018   Hyponatremia 11/24/2018   Nausea vomiting and diarrhea 11/24/2018   Elevated troponin 11/24/2018   Iron deficiency anemia 06/18/2018   Coronary artery disease involving native coronary artery of native heart without angina pectoris 07/03/2017   Pelvic mass 12/20/2016   Hyperphosphatemia 67/07/4579   Metabolic bone disease 99/83/3825   Old MI (myocardial infarction) 03/12/2015   GERD (gastroesophageal reflux disease) 10/07/2014   Insomnia 08/04/2014   Dyspnea on exertion 08/04/2014   Vitamin D deficiency 01/22/2013   Microcytic anemia 01/02/2012   Hypomagnesemia 01/02/2012   Other specified disorders of adrenal gland ([place]) 09/24/2011   Obstructive sleep apnea syndrome 08/06/2011   Asthma, mild persistent 08/06/2011   Insulin-requiring or dependent type II diabetes mellitus ([place]) 08/06/2011   Chronic kidney disease, stage 4 (severe) ([place]) 08/06/2011   History of gastroesophageal reflux (GERD) 08/06/2011   Benign hypertension with CKD (chronic kidney disease) stage IV ([place]) 08/06/2011    Past Surgical History:  Procedure Laterality Date   ABDOMINAL HYSTERECTOMY     CAROTID STENT      CHOLECYSTECTOMY     TUBAL LIGATION     TUMOR REMOVAL  12/2016   Tumor on ovary   VESICOVAGINAL FISTULA CLOSURE W/ TAH       OB History   No obstetric history on file.     Family History  Problem Relation Age of Onset   Heart disease Mother    Heart disease Father    Heart disease Maternal Grandfather    Heart disease Maternal Grandmother    Heart disease Paternal Grandfather    Heart disease Paternal Grandmother    Lung cancer Brother    Diabetes Sister    Diabetes Sister     Social History   Tobacco Use   Smoking status: Never Smoker   Smokeless tobacco: Never Used  Scientific laboratory technician Use: Never used  Substance Use Topics   Alcohol use: No    Alcohol/week: 0.0 standard drinks   Drug use: No    Home Medications Prior to Admission medications   Medication Sig Start Date End Date Taking? Authorizing Provider  albuterol (PROVENTIL) (2.5 MG/3ML) 0.083% nebulizer solution Take 2.5 mg by nebulization every 6 (six) hours as needed for wheezing or shortness of breath.    [provider]  allopurinol (ZYLOPRIM) 100  MG tablet Take 100 mg by mouth daily. 07/20/14   [provider]  aspirin 81 MG tablet Take 81 mg by mouth at bedtime.     [provider]  atorvastatin (LIPITOR) 80 MG tablet TAKE 1 TABLET (80 MG TOTAL) BY MOUTH AT BEDTIME. 04/06/19   Munley, Hilton Cork, MD  B-D UF III MINI PEN NEEDLES 31G X 5 MM MISC  07/09/14   [provider]  budesonide-formoterol (SYMBICORT) 80-4.5 MCG/ACT inhaler SMARTSIG:2 Puff(s) By Mouth Twice Daily 11/29/19   [provider]  cadexomer iodine (IODOSORB) 0.9 % gel Apply 1 application topically daily as needed for wound care. 01/27/20   Stover, Lady Saucier, DPM  carvedilol (COREG) 25 MG tablet TAKE 1 TABLET (25 MG TOTAL) BY MOUTH 2 (TWO) TIMES DAILY WITH A MEAL. 04/06/19   Richardo Priest, MD  Cholecalciferol (VITAMIN D) 2000 UNITS CAPS Take 1 capsule by mouth daily.    [provider]  cloNIDine (CATAPRES) 0.2 MG tablet Take 0.2 mg by mouth 3 (three) times daily.  12/07/19   [provider]  Cobalamin Combinations (B12 FOLATE PO) Take 800 mcg by mouth daily.    [provider]  Coenzyme Q10 (COQ10) 200 MG CAPS Take 1 capsule by mouth daily.    [provider]  Continuous Blood Gluc Sensor (FREESTYLE LIBRE 14 DAY SENSOR) MISC USE every 14 DAYS as directed 12/23/19   [provider]  ezetimibe (ZETIA) 10 MG tablet TAKE 1 TABLET (10 MG TOTAL) BY MOUTH DAILY. 04/06/19   Richardo Priest, MD  FEROSUL 325 (65 Fe) MG tablet Take 325 mg by mouth 2 (two) times daily. 11/19/19   [provider]  hydrALAZINE (APRESOLINE) 50 MG tablet Take 50 mg by mouth 2 (two) times daily. 01/05/20   [provider]  insulin lispro (HUMALOG) 100 UNIT/ML injection Inject into the skin.    [provider]  isosorbide mononitrate (IMDUR) 120 MG 24 hr tablet TAKE 1 TABLET (120 MG TOTAL) BY MOUTH DAILY 04/06/19   Richardo Priest, MD  montelukast (SINGULAIR) 10 MG tablet Take 1 tablet (10 mg total) by mouth daily. 04/17/17   Kozlow, Donnamarie Poag, MD  Multiple Vitamin (MULTIVITAMIN) tablet Take 1 tablet by mouth daily.    [provider]  Multiple Vitamins-Minerals (PRESERVISION AREDS 2 PO) Take 1 capsule by mouth 2 (two) times daily.     [provider]  nitroGLYCERIN (NITROSTAT) 0.4 MG SL tablet Place 0.4 mg under the tongue every 5 (five) minutes as needed for chest pain.    [provider]  omeprazole (PRILOSEC) 40 MG capsule Take 40 mg by mouth daily.    [provider]  potassium chloride (KLOR-CON) 10 MEQ tablet Take 10 mEq by mouth every morning. 12/07/19   [provider]  potassium chloride (KLOR-CON) 10 MEQ tablet Take 10 mEq by mouth every morning. 01/05/20   [provider]  QUEtiapine (SEROQUEL) 50 MG tablet Take 50 mg by mouth at bedtime. 12/09/19   [provider]  risperiDONE  (RISPERDAL) 0.25 MG tablet Take 0.25 mg by mouth daily. 01/17/20   [provider]  sertraline (ZOLOFT) 25 MG tablet Take 25 mg by mouth daily.    [provider]  sulfamethoxazole-trimethoprim (BACTRIM) 400-80 MG tablet Take 1 tablet by mouth 2 (two) times daily. 01/10/20   Landis Martins, DPM  torsemide (DEMADEX) 20 MG tablet Take 20 mg by mouth in the morning, at noon, and at bedtime.  [provider]  trimethoprim-polymyxin b (POLYTRIM) ophthalmic solution instill ONE DROP IN THE RIGHT EYE FOUR TIMES DAILY FOR 2 DAYS AFTER each monthly eye injection 01/07/20   [provider]  vitamin B-12 (CYANOCOBALAMIN) 1000 MCG tablet Take 1,000 mcg by mouth every morning. 01/05/20   [provider]    Allergies    Compazine [prochlorperazine edisylate], Colcrys [colchicine], Hydrocodone, and Codeine  Review of Systems   Review of Systems  Respiratory: Positive for shortness of breath.   All other systems reviewed and are negative.   Physical Exam Updated Vital Signs BP (!) 150/47 (BP Location: Left Arm)    Pulse 79    Temp 98.3 F (36.8 C) (Oral)    Resp 20    Ht 5\' 7"  (1.702 m)    Wt 78.9 kg    SpO2 96%    BMI 27.25 kg/m   Physical Exam Vitals and nursing note reviewed.  Constitutional:      Appearance: She is well-developed.  HENT:     Head: Normocephalic and atraumatic.     Mouth/Throat:     Mouth: Mucous membranes are moist.     Pharynx: Oropharynx is clear.  Eyes:     Extraocular Movements: Extraocular movements intact.     Pupils: Pupils are equal, round, and reactive to light.  Cardiovascular:     Rate and Rhythm: Normal rate and regular rhythm.  Pulmonary:     Effort: Pulmonary effort is normal.     Breath sounds: Normal breath sounds.  Abdominal:     General: Bowel sounds are normal.     Palpations: Abdomen is soft.  Musculoskeletal:        General: Normal range of motion.     Cervical back: Normal range of motion and neck  supple.  Skin:    General: Skin is warm.     Capillary Refill: Capillary refill takes less than 2 seconds.  Neurological:     General: No focal deficit present.     Mental Status: She is alert and oriented to person, place, and time.  Psychiatric:        Mood and Affect: Mood normal.        Behavior: Behavior normal.     ED Results / Procedures / Treatments   Labs (all labs ordered are listed, but only abnormal results are displayed) Labs Reviewed  BASIC METABOLIC PANEL - Abnormal; Notable for the following components:      Result Value   Glucose, Bld 209 (*)    BUN 64 (*)    Creatinine, Ser 3.06 (*)    Calcium 8.5 (*)    GFR calc non Af Amer 14 (*)    GFR calc Af Amer 16 (*)    All other components within normal limits  CBC WITH DIFFERENTIAL/PLATELET - Abnormal; Notable for the following components:   WBC 11.5 (*)    RBC 3.34 (*)    Hemoglobin 8.9 (*)    HCT 27.9 (*)    RDW 16.9 (*)    Neutro Abs 8.7 (*)    All other components within normal limits  BRAIN NATRIURETIC PEPTIDE - Abnormal; Notable for the following components:   B Natriuretic Peptide 288.7 (*)    All other components within normal limits  TROPONIN I (HIGH SENSITIVITY)    EKG EKG Interpretation  Date/Time:  Thursday February 10 2020 16:51:15 EDT Ventricular Rate:  77 PR Interval:    QRS Duration: 96 QT Interval:  383 QTC Calculation: 434  R Axis:   79 Text Interpretation: Sinus rhythm Probable LVH with secondary repol abnrm No significant change since last tracing Confirmed by Isla Pence [phone]) on 02/10/2020 4:53:19 PM   Radiology DG Chest 2 View  Result Date: 02/10/2020 CLINICAL DATA:  Shortness of breath, weight gain EXAM: CHEST - 2 VIEW COMPARISON:  06/23/2019 FINDINGS: Trace pleural effusions. No consolidation. Stable cardiomediastinal silhouette. Aortic atherosclerosis. No pneumothorax. IMPRESSION: Trace pleural effusions. Electronically Signed   By: Donavan Foil M.D.   On: 02/10/2020 17:40      Procedures Procedures (including critical care time)  Medications Ordered in ED Medications  furosemide (LASIX) injection 40 mg (40 mg Intravenous Given 02/10/20 1704)    ED Course  I have reviewed the triage vital signs and the nursing notes.  Pertinent labs & imaging results that were available during my care of the patient were reviewed by me and considered in my medical decision making (see chart for details).    MDM Rules/Calculators/A&P                         Kidney function is stable even after receiving IV dye recently for her aortogram.  Pt given 40 mg lasix IV for her sob.  She was able to ambulate without sob.  The pt's O2 sats are staying above 94%.  Pt is stable for d/c.  Return if worse.  Final Clinical Impression(s) / ED Diagnoses Final diagnoses:  CKD (chronic kidney disease) stage 5, GFR less than 15 ml/min (HCC)  Acute on chronic congestive heart failure, unspecified heart failure type ([place])  Ulcer of toe of right foot, unspecified ulcer stage [facility] [place])    Rx / DC Orders ED Discharge Orders    None       Isla Pence, MD 02/10/20 1909

## 2020-02-11 ENCOUNTER — Telehealth: Payer: Self-pay | Admitting: Internal Medicine

## 2020-02-11 ENCOUNTER — Telehealth: Payer: Self-pay | Admitting: Cardiology

## 2020-02-11 DIAGNOSIS — Z794 Long term (current) use of insulin: Secondary | ICD-10-CM | POA: Diagnosis not present

## 2020-02-11 DIAGNOSIS — E1165 Type 2 diabetes mellitus with hyperglycemia: Secondary | ICD-10-CM | POA: Diagnosis not present

## 2020-02-11 DIAGNOSIS — R0902 Hypoxemia: Secondary | ICD-10-CM | POA: Diagnosis not present

## 2020-02-11 DIAGNOSIS — K219 Gastro-esophageal reflux disease without esophagitis: Secondary | ICD-10-CM | POA: Diagnosis not present

## 2020-02-11 DIAGNOSIS — I110 Hypertensive heart disease with heart failure: Secondary | ICD-10-CM | POA: Diagnosis not present

## 2020-02-11 DIAGNOSIS — J9601 Acute respiratory failure with hypoxia: Secondary | ICD-10-CM | POA: Diagnosis not present

## 2020-02-11 DIAGNOSIS — I5033 Acute on chronic diastolic (congestive) heart failure: Secondary | ICD-10-CM | POA: Diagnosis not present

## 2020-02-11 DIAGNOSIS — D72828 Other elevated white blood cell count: Secondary | ICD-10-CM | POA: Diagnosis not present

## 2020-02-11 DIAGNOSIS — N183 Chronic kidney disease, stage 3 unspecified: Secondary | ICD-10-CM | POA: Diagnosis not present

## 2020-02-11 DIAGNOSIS — Z955 Presence of coronary angioplasty implant and graft: Secondary | ICD-10-CM | POA: Diagnosis not present

## 2020-02-11 DIAGNOSIS — L97519 Non-pressure chronic ulcer of other part of right foot with unspecified severity: Secondary | ICD-10-CM | POA: Diagnosis not present

## 2020-02-11 DIAGNOSIS — E11621 Type 2 diabetes mellitus with foot ulcer: Secondary | ICD-10-CM | POA: Diagnosis not present

## 2020-02-11 DIAGNOSIS — R5381 Other malaise: Secondary | ICD-10-CM | POA: Diagnosis not present

## 2020-02-11 DIAGNOSIS — R0989 Other specified symptoms and signs involving the circulatory and respiratory systems: Secondary | ICD-10-CM | POA: Diagnosis not present

## 2020-02-11 DIAGNOSIS — I509 Heart failure, unspecified: Secondary | ICD-10-CM | POA: Diagnosis not present

## 2020-02-11 DIAGNOSIS — E1100 Type 2 diabetes mellitus with hyperosmolarity without nonketotic hyperglycemic-hyperosmolar coma (NKHHC): Secondary | ICD-10-CM | POA: Diagnosis not present

## 2020-02-11 DIAGNOSIS — Z79899 Other long term (current) drug therapy: Secondary | ICD-10-CM | POA: Diagnosis not present

## 2020-02-11 DIAGNOSIS — I160 Hypertensive urgency: Secondary | ICD-10-CM | POA: Diagnosis not present

## 2020-02-11 DIAGNOSIS — R918 Other nonspecific abnormal finding of lung field: Secondary | ICD-10-CM | POA: Diagnosis not present

## 2020-02-11 DIAGNOSIS — R000 Tachycardia, unspecified: Secondary | ICD-10-CM | POA: Diagnosis not present

## 2020-02-11 DIAGNOSIS — I130 Hypertensive heart and chronic kidney disease with heart failure and stage 1 through stage 4 chronic kidney disease, or unspecified chronic kidney disease: Secondary | ICD-10-CM | POA: Diagnosis not present

## 2020-02-11 DIAGNOSIS — M1990 Unspecified osteoarthritis, unspecified site: Secondary | ICD-10-CM | POA: Diagnosis not present

## 2020-02-11 DIAGNOSIS — I361 Nonrheumatic tricuspid (valve) insufficiency: Secondary | ICD-10-CM | POA: Diagnosis not present

## 2020-02-11 DIAGNOSIS — E1122 Type 2 diabetes mellitus with diabetic chronic kidney disease: Secondary | ICD-10-CM | POA: Diagnosis not present

## 2020-02-11 DIAGNOSIS — J45909 Unspecified asthma, uncomplicated: Secondary | ICD-10-CM | POA: Diagnosis not present

## 2020-02-11 DIAGNOSIS — D649 Anemia, unspecified: Secondary | ICD-10-CM | POA: Diagnosis not present

## 2020-02-11 DIAGNOSIS — R0602 Shortness of breath: Secondary | ICD-10-CM | POA: Diagnosis not present

## 2020-02-11 DIAGNOSIS — E1151 Type 2 diabetes mellitus with diabetic peripheral angiopathy without gangrene: Secondary | ICD-10-CM | POA: Diagnosis not present

## 2020-02-11 DIAGNOSIS — R778 Other specified abnormalities of plasma proteins: Secondary | ICD-10-CM | POA: Diagnosis not present

## 2020-02-11 DIAGNOSIS — I2510 Atherosclerotic heart disease of native coronary artery without angina pectoris: Secondary | ICD-10-CM | POA: Diagnosis not present

## 2020-02-11 DIAGNOSIS — R7989 Other specified abnormal findings of blood chemistry: Secondary | ICD-10-CM | POA: Diagnosis not present

## 2020-02-11 DIAGNOSIS — Z209 Contact with and (suspected) exposure to unspecified communicable disease: Secondary | ICD-10-CM | POA: Diagnosis not present

## 2020-02-11 DIAGNOSIS — E785 Hyperlipidemia, unspecified: Secondary | ICD-10-CM | POA: Diagnosis not present

## 2020-02-11 DIAGNOSIS — Z7982 Long term (current) use of aspirin: Secondary | ICD-10-CM | POA: Diagnosis not present

## 2020-02-11 DIAGNOSIS — I10 Essential (primary) hypertension: Secondary | ICD-10-CM | POA: Diagnosis not present

## 2020-02-11 NOTE — Telephone Encounter (Signed)
Pt c/o Shortness Of Breath: STAT if SOB developed within the last 24 hours or pt is noticeably SOB on the phone  1. Are you currently SOB (can you hear that pt is SOB on the phone)? Spoke with husband but he states that she is SOB  2. How long have you been experiencing SOB? Few nights ago  3. Are you SOB when sitting or when up moving around? Unsure, patient's husband rushed off the phone stating he needed to help his wife.   4. Are you currently experiencing any other symptoms? He stated at the beginning of the conversation that she went to [facility] yesterday and they sent her home. He states she is much worse today, SOB, throwing up and temperature of 100.7.

## 2020-02-11 NOTE — Telephone Encounter (Signed)
Called and spoke with pt's husband Marya Amsler who stated he feels like he has fixed the issue with pt's cpap machine. Stated to him if he had any more issues with it to contact DME [facility] and he verbalized understanding. Nothing further needed.

## 2020-02-11 NOTE — Telephone Encounter (Signed)
Attempted to call with recommendations but phone is busy.

## 2020-02-11 NOTE — Telephone Encounter (Signed)
Take an extra tablet of torsemide daily work her into my office hours Monday or Tuesday I reviewed the ED notes from yesterday

## 2020-02-12 DIAGNOSIS — I361 Nonrheumatic tricuspid (valve) insufficiency: Secondary | ICD-10-CM

## 2020-02-14 DIAGNOSIS — N183 Chronic kidney disease, stage 3 unspecified: Secondary | ICD-10-CM

## 2020-02-14 DIAGNOSIS — E1100 Type 2 diabetes mellitus with hyperosmolarity without nonketotic hyperglycemic-hyperosmolar coma (NKHHC): Secondary | ICD-10-CM

## 2020-02-14 DIAGNOSIS — I509 Heart failure, unspecified: Secondary | ICD-10-CM

## 2020-02-14 DIAGNOSIS — D649 Anemia, unspecified: Secondary | ICD-10-CM

## 2020-02-14 DIAGNOSIS — R7989 Other specified abnormal findings of blood chemistry: Secondary | ICD-10-CM

## 2020-02-14 DIAGNOSIS — I160 Hypertensive urgency: Secondary | ICD-10-CM

## 2020-02-14 NOTE — Telephone Encounter (Signed)
"  Due to technical difficulties your phone call cannot be connected, please try your call later."

## 2020-02-17 ENCOUNTER — Ambulatory Visit: Payer: PPO | Admitting: Sports Medicine

## 2020-02-18 DIAGNOSIS — I130 Hypertensive heart and chronic kidney disease with heart failure and stage 1 through stage 4 chronic kidney disease, or unspecified chronic kidney disease: Secondary | ICD-10-CM | POA: Diagnosis not present

## 2020-02-18 DIAGNOSIS — Z794 Long term (current) use of insulin: Secondary | ICD-10-CM | POA: Diagnosis not present

## 2020-02-18 DIAGNOSIS — N184 Chronic kidney disease, stage 4 (severe): Secondary | ICD-10-CM | POA: Diagnosis not present

## 2020-02-18 DIAGNOSIS — J4530 Mild persistent asthma, uncomplicated: Secondary | ICD-10-CM | POA: Diagnosis not present

## 2020-02-18 DIAGNOSIS — Z8673 Personal history of transient ischemic attack (TIA), and cerebral infarction without residual deficits: Secondary | ICD-10-CM | POA: Diagnosis not present

## 2020-02-18 DIAGNOSIS — I5032 Chronic diastolic (congestive) heart failure: Secondary | ICD-10-CM | POA: Diagnosis not present

## 2020-02-18 DIAGNOSIS — E785 Hyperlipidemia, unspecified: Secondary | ICD-10-CM | POA: Diagnosis not present

## 2020-02-18 DIAGNOSIS — I2510 Atherosclerotic heart disease of native coronary artery without angina pectoris: Secondary | ICD-10-CM | POA: Diagnosis not present

## 2020-02-18 DIAGNOSIS — K219 Gastro-esophageal reflux disease without esophagitis: Secondary | ICD-10-CM | POA: Diagnosis not present

## 2020-02-18 DIAGNOSIS — E1122 Type 2 diabetes mellitus with diabetic chronic kidney disease: Secondary | ICD-10-CM | POA: Diagnosis not present

## 2020-02-18 DIAGNOSIS — E1165 Type 2 diabetes mellitus with hyperglycemia: Secondary | ICD-10-CM | POA: Diagnosis not present

## 2020-02-18 DIAGNOSIS — F329 Major depressive disorder, single episode, unspecified: Secondary | ICD-10-CM | POA: Diagnosis not present

## 2020-02-22 ENCOUNTER — Ambulatory Visit: Payer: PPO | Admitting: Cardiovascular Disease

## 2020-02-22 ENCOUNTER — Telehealth: Payer: Self-pay | Admitting: Internal Medicine

## 2020-02-22 DIAGNOSIS — G4733 Obstructive sleep apnea (adult) (pediatric): Secondary | ICD-10-CM

## 2020-02-22 NOTE — Telephone Encounter (Signed)
Patient called and she is reporting that pressure settings are high on her machine. She wants lower settings and an order for supplies. She is also concerned that machine is not working properly. Please advise. Her DME is [facility] in [place].

## 2020-02-23 DIAGNOSIS — E1122 Type 2 diabetes mellitus with diabetic chronic kidney disease: Secondary | ICD-10-CM | POA: Diagnosis not present

## 2020-02-23 DIAGNOSIS — N183 Chronic kidney disease, stage 3 unspecified: Secondary | ICD-10-CM | POA: Diagnosis not present

## 2020-02-23 DIAGNOSIS — I129 Hypertensive chronic kidney disease with stage 1 through stage 4 chronic kidney disease, or unspecified chronic kidney disease: Secondary | ICD-10-CM | POA: Diagnosis not present

## 2020-02-23 NOTE — Telephone Encounter (Signed)
Her machine is set on Auto set 5-15. Please have DME go and check the machine Order for supplies. Thanks

## 2020-02-23 NOTE — Telephone Encounter (Signed)
Called pt and advised message from the provider. Pt understood and verbalized understanding. Nothing further is needed. Order sent for supplies.

## 2020-02-23 NOTE — Telephone Encounter (Signed)
Message will be sent to APP of the day due to Dr. Annamaria Boots being on vacation this week.  Judson Roch - please advise. Thanks.

## 2020-02-23 NOTE — Addendum Note (Signed)
Addended by: Jannette Spanner on: 02/23/2020 02:56 PM   Modules accepted: Orders

## 2020-02-25 DIAGNOSIS — E1165 Type 2 diabetes mellitus with hyperglycemia: Secondary | ICD-10-CM | POA: Diagnosis not present

## 2020-02-25 DIAGNOSIS — N289 Disorder of kidney and ureter, unspecified: Secondary | ICD-10-CM | POA: Diagnosis not present

## 2020-02-25 DIAGNOSIS — R42 Dizziness and giddiness: Secondary | ICD-10-CM | POA: Diagnosis not present

## 2020-02-25 DIAGNOSIS — I130 Hypertensive heart and chronic kidney disease with heart failure and stage 1 through stage 4 chronic kidney disease, or unspecified chronic kidney disease: Secondary | ICD-10-CM | POA: Diagnosis not present

## 2020-02-25 DIAGNOSIS — I951 Orthostatic hypotension: Secondary | ICD-10-CM | POA: Diagnosis not present

## 2020-02-25 DIAGNOSIS — R569 Unspecified convulsions: Secondary | ICD-10-CM | POA: Diagnosis not present

## 2020-02-25 DIAGNOSIS — E1122 Type 2 diabetes mellitus with diabetic chronic kidney disease: Secondary | ICD-10-CM | POA: Diagnosis not present

## 2020-02-25 DIAGNOSIS — I10 Essential (primary) hypertension: Secondary | ICD-10-CM | POA: Diagnosis not present

## 2020-02-25 DIAGNOSIS — R404 Transient alteration of awareness: Secondary | ICD-10-CM | POA: Diagnosis not present

## 2020-02-25 DIAGNOSIS — N184 Chronic kidney disease, stage 4 (severe): Secondary | ICD-10-CM | POA: Diagnosis not present

## 2020-02-25 DIAGNOSIS — I2510 Atherosclerotic heart disease of native coronary artery without angina pectoris: Secondary | ICD-10-CM | POA: Diagnosis not present

## 2020-02-25 DIAGNOSIS — Z79899 Other long term (current) drug therapy: Secondary | ICD-10-CM | POA: Diagnosis not present

## 2020-02-25 DIAGNOSIS — N183 Chronic kidney disease, stage 3 unspecified: Secondary | ICD-10-CM | POA: Diagnosis not present

## 2020-02-25 DIAGNOSIS — I5032 Chronic diastolic (congestive) heart failure: Secondary | ICD-10-CM | POA: Diagnosis not present

## 2020-02-25 DIAGNOSIS — I739 Peripheral vascular disease, unspecified: Secondary | ICD-10-CM | POA: Diagnosis not present

## 2020-02-25 DIAGNOSIS — R55 Syncope and collapse: Secondary | ICD-10-CM | POA: Diagnosis not present

## 2020-02-25 DIAGNOSIS — D649 Anemia, unspecified: Secondary | ICD-10-CM | POA: Diagnosis not present

## 2020-02-25 DIAGNOSIS — R1110 Vomiting, unspecified: Secondary | ICD-10-CM | POA: Diagnosis not present

## 2020-02-25 DIAGNOSIS — R269 Unspecified abnormalities of gait and mobility: Secondary | ICD-10-CM | POA: Diagnosis not present

## 2020-02-25 DIAGNOSIS — Z9181 History of falling: Secondary | ICD-10-CM | POA: Diagnosis not present

## 2020-02-26 DIAGNOSIS — I951 Orthostatic hypotension: Secondary | ICD-10-CM

## 2020-02-26 DIAGNOSIS — I10 Essential (primary) hypertension: Secondary | ICD-10-CM

## 2020-02-26 DIAGNOSIS — N183 Chronic kidney disease, stage 3 unspecified: Secondary | ICD-10-CM

## 2020-02-26 DIAGNOSIS — I2510 Atherosclerotic heart disease of native coronary artery without angina pectoris: Secondary | ICD-10-CM

## 2020-03-02 ENCOUNTER — Telehealth: Payer: Self-pay | Admitting: Internal Medicine

## 2020-03-02 DIAGNOSIS — G4733 Obstructive sleep apnea (adult) (pediatric): Secondary | ICD-10-CM

## 2020-03-02 NOTE — Telephone Encounter (Signed)
Patient contacted, CPAP machine is broken and DME can not repair it. Dr. Annamaria Boots gave verbal order to replace it. DME order sent to established DME.

## 2020-03-03 DIAGNOSIS — I10 Essential (primary) hypertension: Secondary | ICD-10-CM | POA: Diagnosis not present

## 2020-03-03 DIAGNOSIS — Z6824 Body mass index (BMI) 24.0-24.9, adult: Secondary | ICD-10-CM | POA: Diagnosis not present

## 2020-03-08 DIAGNOSIS — I129 Hypertensive chronic kidney disease with stage 1 through stage 4 chronic kidney disease, or unspecified chronic kidney disease: Secondary | ICD-10-CM | POA: Diagnosis not present

## 2020-03-08 DIAGNOSIS — N184 Chronic kidney disease, stage 4 (severe): Secondary | ICD-10-CM | POA: Diagnosis not present

## 2020-03-13 ENCOUNTER — Encounter (INDEPENDENT_AMBULATORY_CARE_PROVIDER_SITE_OTHER): Payer: PPO | Admitting: Ophthalmology

## 2020-03-13 ENCOUNTER — Other Ambulatory Visit: Payer: Self-pay

## 2020-03-13 DIAGNOSIS — I10 Essential (primary) hypertension: Secondary | ICD-10-CM | POA: Diagnosis not present

## 2020-03-13 DIAGNOSIS — H43813 Vitreous degeneration, bilateral: Secondary | ICD-10-CM | POA: Diagnosis not present

## 2020-03-13 DIAGNOSIS — H34831 Tributary (branch) retinal vein occlusion, right eye, with macular edema: Secondary | ICD-10-CM

## 2020-03-13 DIAGNOSIS — D3132 Benign neoplasm of left choroid: Secondary | ICD-10-CM | POA: Diagnosis not present

## 2020-03-13 DIAGNOSIS — H35033 Hypertensive retinopathy, bilateral: Secondary | ICD-10-CM

## 2020-03-13 DIAGNOSIS — H353132 Nonexudative age-related macular degeneration, bilateral, intermediate dry stage: Secondary | ICD-10-CM | POA: Diagnosis not present

## 2020-03-15 DIAGNOSIS — N179 Acute kidney failure, unspecified: Secondary | ICD-10-CM | POA: Diagnosis not present

## 2020-03-15 DIAGNOSIS — D631 Anemia in chronic kidney disease: Secondary | ICD-10-CM | POA: Diagnosis not present

## 2020-03-15 DIAGNOSIS — M9080 Osteopathy in diseases classified elsewhere, unspecified site: Secondary | ICD-10-CM | POA: Diagnosis not present

## 2020-03-15 DIAGNOSIS — I129 Hypertensive chronic kidney disease with stage 1 through stage 4 chronic kidney disease, or unspecified chronic kidney disease: Secondary | ICD-10-CM | POA: Diagnosis not present

## 2020-03-15 DIAGNOSIS — Z794 Long term (current) use of insulin: Secondary | ICD-10-CM | POA: Diagnosis not present

## 2020-03-15 DIAGNOSIS — E559 Vitamin D deficiency, unspecified: Secondary | ICD-10-CM | POA: Diagnosis not present

## 2020-03-15 DIAGNOSIS — I952 Hypotension due to drugs: Secondary | ICD-10-CM | POA: Insufficient documentation

## 2020-03-15 DIAGNOSIS — R801 Persistent proteinuria, unspecified: Secondary | ICD-10-CM | POA: Diagnosis not present

## 2020-03-15 DIAGNOSIS — N184 Chronic kidney disease, stage 4 (severe): Secondary | ICD-10-CM | POA: Diagnosis not present

## 2020-03-15 DIAGNOSIS — E889 Metabolic disorder, unspecified: Secondary | ICD-10-CM | POA: Diagnosis not present

## 2020-03-15 DIAGNOSIS — I701 Atherosclerosis of renal artery: Secondary | ICD-10-CM | POA: Diagnosis not present

## 2020-03-15 DIAGNOSIS — E1122 Type 2 diabetes mellitus with diabetic chronic kidney disease: Secondary | ICD-10-CM | POA: Diagnosis not present

## 2020-03-16 ENCOUNTER — Ambulatory Visit: Payer: PPO | Admitting: Sports Medicine

## 2020-03-16 ENCOUNTER — Other Ambulatory Visit: Payer: Self-pay

## 2020-03-16 ENCOUNTER — Encounter: Payer: Self-pay | Admitting: Sports Medicine

## 2020-03-16 DIAGNOSIS — M79609 Pain in unspecified limb: Secondary | ICD-10-CM | POA: Diagnosis not present

## 2020-03-16 DIAGNOSIS — L97511 Non-pressure chronic ulcer of other part of right foot limited to breakdown of skin: Secondary | ICD-10-CM | POA: Diagnosis not present

## 2020-03-16 DIAGNOSIS — B351 Tinea unguium: Secondary | ICD-10-CM

## 2020-03-16 DIAGNOSIS — E1140 Type 2 diabetes mellitus with diabetic neuropathy, unspecified: Secondary | ICD-10-CM | POA: Diagnosis not present

## 2020-03-16 DIAGNOSIS — I739 Peripheral vascular disease, unspecified: Secondary | ICD-10-CM | POA: Diagnosis not present

## 2020-03-16 NOTE — Progress Notes (Signed)
Subjective: Leah Walsh is a 79 y.o. female patient seen in office for follow-up evaluation of ulceration of the right great toe. Patient dont know if its better, has been in hospital twice, has a history of diabetes and a blood glucose level today of 109mg /dl, last A1c 7.4.   Patient is changing the dressing using Iodosorb with the help of her husband, denies nausea/fever/vomiting/chills/night sweats/shortness of breath/pain. Requests nail trim, patient has no other pedal complaints at this time.  Patient Active Problem List   Diagnosis Date Noted  . Hypokalemia 02/17/2019  . AKI (acute kidney injury) ([place]) 12/21/2018  . Anemia in stage 4 chronic kidney disease ([place]) 12/17/2018  . Chronic diastolic heart failure ([place]) 12/16/2018  . Renal artery stenosis ([place]) 12/03/2018  . Acute Respiratory failure with hypoxia ([place]) 11/28/2018  . Acute renal failure superimposed on stage 4 chronic kidney disease ([place]) 11/24/2018  . CAP (community acquired pneumonia) 11/24/2018  . Hypertensive heart and kidney disease with chronic diastolic congestive heart failure and stage 4 chronic kidney disease ([place]) 11/24/2018  . Hyponatremia 11/24/2018  . Nausea vomiting and diarrhea 11/24/2018  . Elevated troponin 11/24/2018  . Iron deficiency anemia 06/18/2018  . Coronary artery disease involving native coronary artery of native heart without angina pectoris 07/03/2017  . Pelvic mass 12/20/2016  . Hyperphosphatemia 08/22/2016  . Metabolic bone disease 55/73/2202  . Old MI (myocardial infarction) 03/12/2015  . GERD (gastroesophageal reflux disease) 10/07/2014  . Insomnia 08/04/2014  . Dyspnea on exertion 08/04/2014  . Vitamin D deficiency 01/22/2013  . Microcytic anemia 01/02/2012  . Hypomagnesemia 01/02/2012  . Other specified disorders of adrenal gland ([place]) 09/24/2011  . Obstructive sleep apnea syndrome 08/06/2011  . Asthma, mild persistent 08/06/2011  . Insulin-requiring or dependent type II diabetes  mellitus ([place]) 08/06/2011  . Chronic kidney disease, stage 4 (severe) ([place]) 08/06/2011  . History of gastroesophageal reflux (GERD) 08/06/2011  . Benign hypertension with CKD (chronic kidney disease) stage IV ([place]) 08/06/2011   Current Outpatient Medications on File Prior to Visit  Medication Sig Dispense Refill  . albuterol (PROVENTIL) (2.5 MG/3ML) 0.083% nebulizer solution Take 2.5 mg by nebulization every 6 (six) hours as needed for wheezing or shortness of breath.    . allopurinol (ZYLOPRIM) 100 MG tablet Take 100 mg by mouth daily.  3  . aspirin 81 MG tablet Take 81 mg by mouth at bedtime.     [name] atorvastatin (LIPITOR) 80 MG tablet TAKE 1 TABLET (80 MG TOTAL) BY MOUTH AT BEDTIME. 90 tablet 1  . B-D UF III MINI PEN NEEDLES 31G X 5 MM MISC   6  . budesonide-formoterol (SYMBICORT) 80-4.5 MCG/ACT inhaler SMARTSIG:2 Puff(s) By Mouth Twice Daily    . cadexomer iodine (IODOSORB) 0.9 % gel Apply 1 application topically daily as needed for wound care. 40 g 0  . carvedilol (COREG) 25 MG tablet TAKE 1 TABLET (25 MG TOTAL) BY MOUTH 2 (TWO) TIMES DAILY WITH A MEAL. 180 tablet 1  . Cholecalciferol (VITAMIN D) 2000 UNITS CAPS Take 1 capsule by mouth daily.    . cloNIDine (CATAPRES) 0.2 MG tablet Take 0.2 mg by mouth 3 (three) times daily.     . Cobalamin Combinations (B12 FOLATE PO) Take 800 mcg by mouth daily.    . Coenzyme Q10 (COQ10) 200 MG CAPS Take 1 capsule by mouth daily.    . Continuous Blood Gluc Sensor (FREESTYLE LIBRE 14 DAY SENSOR) MISC USE every 14 DAYS as directed    . ezetimibe (ZETIA) 10  MG tablet TAKE 1 TABLET (10 MG TOTAL) BY MOUTH DAILY. 90 tablet 1  . FEROSUL 325 (65 Fe) MG tablet Take 325 mg by mouth 2 (two) times daily.    . hydrALAZINE (APRESOLINE) 50 MG tablet Take 50 mg by mouth 2 (two) times daily.    . insulin lispro (HUMALOG) 100 UNIT/ML injection Inject into the skin.    [name] isosorbide mononitrate (IMDUR) 120 MG 24 hr tablet TAKE 1 TABLET (120 MG TOTAL) BY MOUTH DAILY 90  tablet 1  . montelukast (SINGULAIR) 10 MG tablet Take 1 tablet (10 mg total) by mouth daily. 30 tablet 5  . Multiple Vitamin (MULTIVITAMIN) tablet Take 1 tablet by mouth daily.    . Multiple Vitamins-Minerals (PRESERVISION AREDS 2 PO) Take 1 capsule by mouth 2 (two) times daily.     . nitroGLYCERIN (NITROSTAT) 0.4 MG SL tablet Place 0.4 mg under the tongue every 5 (five) minutes as needed for chest pain.    [name] omeprazole (PRILOSEC) 40 MG capsule Take 40 mg by mouth daily.    . potassium chloride (KLOR-CON) 10 MEQ tablet Take 10 mEq by mouth every morning.    . potassium chloride (KLOR-CON) 10 MEQ tablet Take 10 mEq by mouth every morning.    [name] QUEtiapine (SEROQUEL) 50 MG tablet Take 50 mg by mouth at bedtime.    . risperiDONE (RISPERDAL) 0.25 MG tablet Take 0.25 mg by mouth daily.    . sertraline (ZOLOFT) 25 MG tablet Take 25 mg by mouth daily.    [name] sulfamethoxazole-trimethoprim (BACTRIM) 400-80 MG tablet Take 1 tablet by mouth 2 (two) times daily. 28 tablet 0  . torsemide (DEMADEX) 20 MG tablet Take 20 mg by mouth in the morning, at noon, and at bedtime.     [name] trimethoprim-polymyxin b (POLYTRIM) ophthalmic solution instill ONE DROP IN THE RIGHT EYE FOUR TIMES DAILY FOR 2 DAYS AFTER each monthly eye injection    . vitamin B-12 (CYANOCOBALAMIN) 1000 MCG tablet Take 1,000 mcg by mouth every morning.     No current facility-administered medications on file prior to visit.   Allergies  Allergen Reactions  . Compazine [Prochlorperazine Edisylate] Other (See Comments)    Stroke like symptoms  . Colcrys [Colchicine] Diarrhea  . Hydrocodone   . Codeine Nausea And Vomiting    Recent Results (from the past 2160 hour(s))  WOUND CULTURE     Status: Abnormal   Collection Time: 01/06/20  2:13 PM   Specimen: Foot, Right; Wound   WOUND CULTURE AND SENS  Result Value Ref Range   Gram Stain Result Final report    Organism ID, Bacteria Comment     Comment: No white blood cells seen.   Organism ID,  Bacteria No organisms seen    Aerobic Bacterial Culture Final report (A)    Organism ID, Bacteria Staphylococcus aureus (A)     Comment: Heavy growth Based on susceptibility to oxacillin this isolate would be susceptible to: *Penicillinase-stable penicillins, such as:   Cloxacillin, Dicloxacillin, Nafcillin *Beta-lactam combination agents, such as:   Amoxicillin-clavulanic acid, Ampicillin-sulbactam,   Piperacillin-tazobactam *Oral cephems, such as:   Cefaclor, Cefdinir, Cefpodoxime, Cefprozil, Cefuroxime,   Cephalexin, Loracarbef *Parenteral cephems, such as:   Cefazolin, Cefepime, Cefotaxime, Cefotetan, Ceftaroline,   Ceftizoxime, Ceftriaxone, Cefuroxime *Carbapenems, such as:   Doripenem, Ertapenem, Imipenem, Meropenem    Antimicrobial Susceptibility Comment     Comment:       ** S = Susceptible; I = Intermediate; R = Resistant **  P = Positive; N = Negative             MICS are expressed in micrograms per mL    Antibiotic                 RSLT#1    RSLT#2    RSLT#3    RSLT#4 Ciprofloxacin                  S Clindamycin                    R Erythromycin                   R Gentamicin                     S Levofloxacin                   S Linezolid                      S Moxifloxacin                   S Oxacillin                      S Penicillin                     R Quinupristin/Dalfopristin      S Rifampin                       S Tetracycline                   S Trimethoprim/Sulfa             S Vancomycin                     S   Basic metabolic panel     Status: Abnormal   Collection Time: 02/10/20  5:01 PM  Result Value Ref Range   Sodium 139 135 - 145 mmol/L   Potassium 4.1 3.5 - 5.1 mmol/L   Chloride 105 98 - 111 mmol/L   CO2 23 22 - 32 mmol/L   Glucose, Bld 209 (H) 70 - 99 mg/dL    Comment: Glucose reference range applies only to samples taken after fasting for at least 8 hours.   BUN 64 (H) 8 - 23 mg/dL   Creatinine, Ser 3.06 (H) 0.44 -  1.00 mg/dL   Calcium 8.5 (L) 8.9 - 10.3 mg/dL   GFR calc non Af Amer 14 (L) >60 mL/min   GFR calc Af Amer 16 (L) >60 mL/min   Anion gap 11 5 - 15    Comment: Performed at [facility], [address] [place], [place] [address]  CBC with Differential     Status: Abnormal   Collection Time: 02/10/20  5:01 PM  Result Value Ref Range   WBC 11.5 (H) 4.0 - 10.5 K/uL   RBC 3.34 (L) 3.87 - 5.11 MIL/uL   Hemoglobin 8.9 (L) 12.0 - 15.0 g/dL   HCT 27.9 (L) 36 - 46 %   MCV 83.5 80.0 - 100.0 fL   MCH 26.6 26.0 - 34.0 pg   MCHC 31.9 30.0 - 36.0 g/dL   RDW 16.9 (H) 11.5 - 15.5 %   Platelets 207 150 - 400 K/uL   nRBC 0.0  0.0 - 0.2 %   Neutrophils Relative % 75 %   Neutro Abs 8.7 (H) 1.7 - 7.7 K/uL   Lymphocytes Relative 15 %   Lymphs Abs 1.7 0.7 - 4.0 K/uL   Monocytes Relative 6 %   Monocytes Absolute 0.7 0 - 1 K/uL   Eosinophils Relative 3 %   Eosinophils Absolute 0.3 0 - 0 K/uL   Basophils Relative 0 %   Basophils Absolute 0.0 0 - 0 K/uL   Immature Granulocytes 1 %   Abs Immature Granulocytes 0.06 0.00 - 0.07 K/uL    Comment: Performed at [facility], [address] [place], [place] [address]  Brain natriuretic peptide     Status: Abnormal   Collection Time: 02/10/20  5:01 PM  Result Value Ref Range   B Natriuretic Peptide 288.7 (H) 0.0 - 100.0 pg/mL    Comment: Performed at [facility], [address] [place], [place] [address]  Troponin I (High Sensitivity)     Status: None   Collection Time: 02/10/20  5:01 PM  Result Value Ref Range   Troponin I (High Sensitivity) 9 <18 ng/L    Comment: (NOTE) Elevated high sensitivity troponin I (hsTnI) values and significant  changes across serial measurements may suggest ACS but many other  chronic and acute conditions are known to elevate hsTnI results.  Refer to the "Links" section for chest pain algorithms and additional  guidance. Performed at [facility], [address] [place],  [place] [address]     Objective: There were no vitals filed for this visit.  General: Patient is awake, alert, oriented x 3 and in no acute distress.  Dermatology: Skin is warm and dry bilateral with a now healed ulceration after debridement with no underlying opening. There is no malodor, no active drainage, blanchable erythema, mild edema. No acute signs of infection.  Severely thickened bilateral hallux toenails likely consistent with onychomycosis versus traumatic nail changes secondary to foot type and bunions   Vascular: Dorsalis Pedis pulse = 1/4 Bilateral,  Posterior Tibial pulse = 0/4 Bilateral,  Capillary Fill Time < 5 seconds, 1+ pitting edema bilateral  Neurologic: Protective sensation severely diminished bilateral with Weinstein monofilament  Musculosketal:  No Pain with palpation to ulcerated area.  Significant bunion hammertoe and pes planus deformity bilateral right greater than left.  No results for input(s): GRAMSTAIN, LABORGA in the last 8760 hours.  Assessment and Plan:  Problem List Items Addressed This Visit    None    Visit Diagnoses    Skin ulcer of toe of right foot, limited to breakdown of skin ([place])    -  Primary   prematurely healed   PAD (peripheral artery disease) ([place])       Type 2 diabetes mellitus with diabetic neuropathy, unspecified whether long term insulin use ([place])       Pain due to onychomycosis of nail         -Examined patient and discussed the progression of the wound and treatment alternatives. - Excisionally dedbrided ulceration using chisel blade at right great toe with no opening noted. Patient tolerated procedure well without any discomfort or anesthesia necessary for this wound debridement.  -Applied protective bandaid dressing and advised patient to do the same for at least 1 week -May transition to normal shoe as tolerated  - Advised patient to go to the ER or return to office if the wound worsens or if constitutional symptoms are  present. -Continue with vascular follow up for eval in the setting of now healed ulceration -At no charge, debrided nails using nail nipper without incident and dispensed toe cap for left 1st toe to protect the distal tuft from rubbing -Patient to return to office in 6 weeks for follow up care and evaluation or sooner if problems arise.  Landis Martins, DPM

## 2020-03-19 DIAGNOSIS — I951 Orthostatic hypotension: Secondary | ICD-10-CM | POA: Diagnosis not present

## 2020-03-19 DIAGNOSIS — E1122 Type 2 diabetes mellitus with diabetic chronic kidney disease: Secondary | ICD-10-CM | POA: Diagnosis not present

## 2020-03-19 DIAGNOSIS — Z794 Long term (current) use of insulin: Secondary | ICD-10-CM | POA: Diagnosis not present

## 2020-03-19 DIAGNOSIS — I701 Atherosclerosis of renal artery: Secondary | ICD-10-CM | POA: Diagnosis not present

## 2020-03-19 DIAGNOSIS — E785 Hyperlipidemia, unspecified: Secondary | ICD-10-CM | POA: Diagnosis not present

## 2020-03-19 DIAGNOSIS — I2510 Atherosclerotic heart disease of native coronary artery without angina pectoris: Secondary | ICD-10-CM | POA: Diagnosis not present

## 2020-03-19 DIAGNOSIS — K219 Gastro-esophageal reflux disease without esophagitis: Secondary | ICD-10-CM | POA: Diagnosis not present

## 2020-03-19 DIAGNOSIS — E1151 Type 2 diabetes mellitus with diabetic peripheral angiopathy without gangrene: Secondary | ICD-10-CM | POA: Diagnosis not present

## 2020-03-19 DIAGNOSIS — I5032 Chronic diastolic (congestive) heart failure: Secondary | ICD-10-CM | POA: Diagnosis not present

## 2020-03-19 DIAGNOSIS — D631 Anemia in chronic kidney disease: Secondary | ICD-10-CM | POA: Diagnosis not present

## 2020-03-19 DIAGNOSIS — Z7982 Long term (current) use of aspirin: Secondary | ICD-10-CM | POA: Diagnosis not present

## 2020-03-19 DIAGNOSIS — F329 Major depressive disorder, single episode, unspecified: Secondary | ICD-10-CM | POA: Diagnosis not present

## 2020-03-19 DIAGNOSIS — N184 Chronic kidney disease, stage 4 (severe): Secondary | ICD-10-CM | POA: Diagnosis not present

## 2020-03-19 DIAGNOSIS — I130 Hypertensive heart and chronic kidney disease with heart failure and stage 1 through stage 4 chronic kidney disease, or unspecified chronic kidney disease: Secondary | ICD-10-CM | POA: Diagnosis not present

## 2020-03-19 DIAGNOSIS — Z8673 Personal history of transient ischemic attack (TIA), and cerebral infarction without residual deficits: Secondary | ICD-10-CM | POA: Diagnosis not present

## 2020-03-19 DIAGNOSIS — E1165 Type 2 diabetes mellitus with hyperglycemia: Secondary | ICD-10-CM | POA: Diagnosis not present

## 2020-03-19 DIAGNOSIS — J4530 Mild persistent asthma, uncomplicated: Secondary | ICD-10-CM | POA: Diagnosis not present

## 2020-03-19 DIAGNOSIS — N110 Nonobstructive reflux-associated chronic pyelonephritis: Secondary | ICD-10-CM | POA: Diagnosis not present

## 2020-03-21 ENCOUNTER — Ambulatory Visit: Payer: PPO | Admitting: Cardiovascular Disease

## 2020-03-25 DIAGNOSIS — E1122 Type 2 diabetes mellitus with diabetic chronic kidney disease: Secondary | ICD-10-CM | POA: Diagnosis not present

## 2020-03-25 DIAGNOSIS — N183 Chronic kidney disease, stage 3 unspecified: Secondary | ICD-10-CM | POA: Diagnosis not present

## 2020-03-25 DIAGNOSIS — I229 Subsequent ST elevation (STEMI) myocardial infarction of unspecified site: Secondary | ICD-10-CM | POA: Diagnosis not present

## 2020-04-17 ENCOUNTER — Other Ambulatory Visit: Payer: Self-pay

## 2020-04-17 ENCOUNTER — Encounter (INDEPENDENT_AMBULATORY_CARE_PROVIDER_SITE_OTHER): Payer: PPO | Admitting: Ophthalmology

## 2020-04-17 DIAGNOSIS — H35033 Hypertensive retinopathy, bilateral: Secondary | ICD-10-CM | POA: Diagnosis not present

## 2020-04-17 DIAGNOSIS — H353132 Nonexudative age-related macular degeneration, bilateral, intermediate dry stage: Secondary | ICD-10-CM

## 2020-04-17 DIAGNOSIS — I10 Essential (primary) hypertension: Secondary | ICD-10-CM

## 2020-04-17 DIAGNOSIS — H43813 Vitreous degeneration, bilateral: Secondary | ICD-10-CM | POA: Diagnosis not present

## 2020-04-17 DIAGNOSIS — H34831 Tributary (branch) retinal vein occlusion, right eye, with macular edema: Secondary | ICD-10-CM | POA: Diagnosis not present

## 2020-04-25 DIAGNOSIS — N183 Chronic kidney disease, stage 3 unspecified: Secondary | ICD-10-CM | POA: Diagnosis not present

## 2020-04-25 DIAGNOSIS — E1122 Type 2 diabetes mellitus with diabetic chronic kidney disease: Secondary | ICD-10-CM | POA: Diagnosis not present

## 2020-04-25 DIAGNOSIS — I129 Hypertensive chronic kidney disease with stage 1 through stage 4 chronic kidney disease, or unspecified chronic kidney disease: Secondary | ICD-10-CM | POA: Diagnosis not present

## 2020-04-27 ENCOUNTER — Other Ambulatory Visit: Payer: Self-pay | Admitting: Cardiology

## 2020-04-28 ENCOUNTER — Ambulatory Visit: Payer: PPO | Admitting: Sports Medicine

## 2020-05-02 ENCOUNTER — Other Ambulatory Visit: Payer: Self-pay

## 2020-05-02 ENCOUNTER — Encounter: Payer: Self-pay | Admitting: Sports Medicine

## 2020-05-02 ENCOUNTER — Ambulatory Visit: Payer: PPO | Admitting: Sports Medicine

## 2020-05-02 DIAGNOSIS — B351 Tinea unguium: Secondary | ICD-10-CM | POA: Diagnosis not present

## 2020-05-02 DIAGNOSIS — I739 Peripheral vascular disease, unspecified: Secondary | ICD-10-CM

## 2020-05-02 DIAGNOSIS — E1140 Type 2 diabetes mellitus with diabetic neuropathy, unspecified: Secondary | ICD-10-CM | POA: Diagnosis not present

## 2020-05-02 DIAGNOSIS — M79676 Pain in unspecified toe(s): Secondary | ICD-10-CM

## 2020-05-02 DIAGNOSIS — L97511 Non-pressure chronic ulcer of other part of right foot limited to breakdown of skin: Secondary | ICD-10-CM

## 2020-05-02 NOTE — Progress Notes (Signed)
Subjective: Leah Walsh is a 79 y.o. female patient seen in office for follow-up evaluation of ulceration of the right great toe and for nail care.  Patient reports that her right great toe is doing fine has occasional hard skin but no other issues besides a little bit of redness and pain every now and again.  Patient reports that her last blood sugar was 115 last A1c 7.8 and last visit to PCP was 3 to 4 weeks ago.  Patient Active Problem List   Diagnosis Date Noted  . Hypokalemia 02/17/2019  . AKI (acute kidney injury) ([place]) 12/21/2018  . Anemia in stage 4 chronic kidney disease ([place]) 12/17/2018  . Chronic diastolic heart failure ([place]) 12/16/2018  . Renal artery stenosis ([place]) 12/03/2018  . Acute Respiratory failure with hypoxia ([place]) 11/28/2018  . Acute renal failure superimposed on stage 4 chronic kidney disease ([place]) 11/24/2018  . CAP (community acquired pneumonia) 11/24/2018  . Hypertensive heart and kidney disease with chronic diastolic congestive heart failure and stage 4 chronic kidney disease ([place]) 11/24/2018  . Hyponatremia 11/24/2018  . Nausea vomiting and diarrhea 11/24/2018  . Elevated troponin 11/24/2018  . Iron deficiency anemia 06/18/2018  . Coronary artery disease involving native coronary artery of native heart without angina pectoris 07/03/2017  . Pelvic mass 12/20/2016  . Hyperphosphatemia 08/22/2016  . Metabolic bone disease 93/81/0175  . Old MI (myocardial infarction) 03/12/2015  . GERD (gastroesophageal reflux disease) 10/07/2014  . Insomnia 08/04/2014  . Dyspnea on exertion 08/04/2014  . Vitamin D deficiency 01/22/2013  . Microcytic anemia 01/02/2012  . Hypomagnesemia 01/02/2012  . Other specified disorders of adrenal gland ([place]) 09/24/2011  . Obstructive sleep apnea syndrome 08/06/2011  . Asthma, mild persistent 08/06/2011  . Insulin-requiring or dependent type II diabetes mellitus ([place]) 08/06/2011  . Chronic kidney disease, stage 4 (severe) ([place])  08/06/2011  . History of gastroesophageal reflux (GERD) 08/06/2011  . Benign hypertension with CKD (chronic kidney disease) stage IV ([place]) 08/06/2011   Current Outpatient Medications on File Prior to Visit  Medication Sig Dispense Refill  . albuterol (PROVENTIL) (2.5 MG/3ML) 0.083% nebulizer solution Take 2.5 mg by nebulization every 6 (six) hours as needed for wheezing or shortness of breath.    . allopurinol (ZYLOPRIM) 100 MG tablet Take 100 mg by mouth daily.  3  . aspirin 81 MG tablet Take 81 mg by mouth at bedtime.     [name] atorvastatin (LIPITOR) 80 MG tablet TAKE 1 TABLET (80 MG TOTAL) BY MOUTH AT BEDTIME. 90 tablet 1  . B-D UF III MINI PEN NEEDLES 31G X 5 MM MISC   6  . budesonide-formoterol (SYMBICORT) 80-4.5 MCG/ACT inhaler SMARTSIG:2 Puff(s) By Mouth Twice Daily    . cadexomer iodine (IODOSORB) 0.9 % gel Apply 1 application topically daily as needed for wound care. 40 g 0  . carvedilol (COREG) 25 MG tablet TAKE 1 TABLET (25 MG TOTAL) BY MOUTH 2 (TWO) TIMES DAILY WITH A MEAL. 180 tablet 1  . Cholecalciferol (VITAMIN D) 2000 UNITS CAPS Take 1 capsule by mouth daily.    . cloNIDine (CATAPRES) 0.2 MG tablet Take 0.2 mg by mouth 3 (three) times daily.     . Cobalamin Combinations (B12 FOLATE PO) Take 800 mcg by mouth daily.    . Coenzyme Q10 (COQ10) 200 MG CAPS Take 1 capsule by mouth daily.    . Continuous Blood Gluc Sensor (FREESTYLE LIBRE 14 DAY SENSOR) MISC USE every 14 DAYS as directed    . ezetimibe (ZETIA) 10  MG tablet TAKE 1 TABLET (10 MG TOTAL) BY MOUTH DAILY. 90 tablet 1  . FEROSUL 325 (65 Fe) MG tablet Take 325 mg by mouth 2 (two) times daily.    . hydrALAZINE (APRESOLINE) 50 MG tablet Take 50 mg by mouth 2 (two) times daily.    . insulin lispro (HUMALOG) 100 UNIT/ML injection Inject into the skin.    [name] isosorbide mononitrate (IMDUR) 120 MG 24 hr tablet TAKE 1 TABLET (120 MG TOTAL) BY MOUTH DAILY 90 tablet 1  . montelukast (SINGULAIR) 10 MG tablet Take 1 tablet (10 mg total) by  mouth daily. 30 tablet 5  . Multiple Vitamin (MULTIVITAMIN) tablet Take 1 tablet by mouth daily.    . Multiple Vitamins-Minerals (PRESERVISION AREDS 2 PO) Take 1 capsule by mouth 2 (two) times daily.     . nitroGLYCERIN (NITROSTAT) 0.4 MG SL tablet PLACE 1 TABLET (0.4 MG TOTAL) UNDER THE TONGUE EVERY 5 (FIVE) MINUTES AS NEEDED FOR CHEST PAIN. 25 tablet 3  . omeprazole (PRILOSEC) 40 MG capsule Take 40 mg by mouth daily.    . potassium chloride (KLOR-CON) 10 MEQ tablet Take 10 mEq by mouth every morning.    . potassium chloride (KLOR-CON) 10 MEQ tablet Take 10 mEq by mouth every morning.    [name] QUEtiapine (SEROQUEL) 50 MG tablet Take 50 mg by mouth at bedtime.    . risperiDONE (RISPERDAL) 0.25 MG tablet Take 0.25 mg by mouth daily.    . sertraline (ZOLOFT) 25 MG tablet Take 25 mg by mouth daily.    [name] sulfamethoxazole-trimethoprim (BACTRIM) 400-80 MG tablet Take 1 tablet by mouth 2 (two) times daily. 28 tablet 0  . torsemide (DEMADEX) 20 MG tablet Take 20 mg by mouth in the morning, at noon, and at bedtime.     [name] trimethoprim-polymyxin b (POLYTRIM) ophthalmic solution instill ONE DROP IN THE RIGHT EYE FOUR TIMES DAILY FOR 2 DAYS AFTER each monthly eye injection    . vitamin B-12 (CYANOCOBALAMIN) 1000 MCG tablet Take 1,000 mcg by mouth every morning.     No current facility-administered medications on file prior to visit.   Allergies  Allergen Reactions  . Compazine [Prochlorperazine Edisylate] Other (See Comments)    Stroke like symptoms  . Colcrys [Colchicine] Diarrhea  . Hydrocodone   . Codeine Nausea And Vomiting    Recent Results (from the past 2160 hour(s))  Basic metabolic panel     Status: Abnormal   Collection Time: 02/10/20  5:01 PM  Result Value Ref Range   Sodium 139 135 - 145 mmol/L   Potassium 4.1 3.5 - 5.1 mmol/L   Chloride 105 98 - 111 mmol/L   CO2 23 22 - 32 mmol/L   Glucose, Bld 209 (H) 70 - 99 mg/dL    Comment: Glucose reference range applies only to samples taken  after fasting for at least 8 hours.   BUN 64 (H) 8 - 23 mg/dL   Creatinine, Ser 3.06 (H) 0.44 - 1.00 mg/dL   Calcium 8.5 (L) 8.9 - 10.3 mg/dL   GFR calc non Af Amer 14 (L) >60 mL/min   GFR calc Af Amer 16 (L) >60 mL/min   Anion gap 11 5 - 15    Comment: Performed at [facility], [address] [place], [place] [address]  CBC with Differential     Status: Abnormal   Collection Time: 02/10/20  5:01 PM  Result Value Ref Range   WBC 11.5 (H) 4.0 - 10.5 K/uL  RBC 3.34 (L) 3.87 - 5.11 MIL/uL   Hemoglobin 8.9 (L) 12.0 - 15.0 g/dL   HCT 27.9 (L) 36 - 46 %   MCV 83.5 80.0 - 100.0 fL   MCH 26.6 26.0 - 34.0 pg   MCHC 31.9 30.0 - 36.0 g/dL   RDW 16.9 (H) 11.5 - 15.5 %   Platelets 207 150 - 400 K/uL   nRBC 0.0 0.0 - 0.2 %   Neutrophils Relative % 75 %   Neutro Abs 8.7 (H) 1.7 - 7.7 K/uL   Lymphocytes Relative 15 %   Lymphs Abs 1.7 0.7 - 4.0 K/uL   Monocytes Relative 6 %   Monocytes Absolute 0.7 0 - 1 K/uL   Eosinophils Relative 3 %   Eosinophils Absolute 0.3 0 - 0 K/uL   Basophils Relative 0 %   Basophils Absolute 0.0 0 - 0 K/uL   Immature Granulocytes 1 %   Abs Immature Granulocytes 0.06 0.00 - 0.07 K/uL    Comment: Performed at [facility], [address] [place], [place] [address]  Brain natriuretic peptide     Status: Abnormal   Collection Time: 02/10/20  5:01 PM  Result Value Ref Range   B Natriuretic Peptide 288.7 (H) 0.0 - 100.0 pg/mL    Comment: Performed at [facility], [address] [place], [place] [address]  Troponin I (High Sensitivity)     Status: None   Collection Time: 02/10/20  5:01 PM  Result Value Ref Range   Troponin I (High Sensitivity) 9 <18 ng/L    Comment: (NOTE) Elevated high sensitivity troponin I (hsTnI) values and significant  changes across serial measurements may suggest ACS but many other  chronic and acute conditions are known to elevate hsTnI results.  Refer to the "Links" section for chest pain algorithms and  additional  guidance. Performed at [facility], [address] [place], [place] [address]     Objective: There were no vitals filed for this visit.  General: Patient is awake, alert, oriented x 3 and in no acute distress.  Dermatology: Skin is warm and dry bilateral with a continued healed right hallux ulceration now with mild reactive keratosis, blanchable erythema, mild edema. No acute signs of infection.  Severely thickened bilateral hallux toenails likely consistent with onychomycosis versus traumatic nail changes secondary to foot type and bunions with all other nails mildly thickened and elongated consistent with onychomycosis.   Vascular: Dorsalis Pedis pulse = 1/4 Bilateral,  Posterior Tibial pulse = 0/4 Bilateral,  Capillary Fill Time < 5 seconds, 1+ pitting edema bilateral  Neurologic: Protective sensation severely diminished bilateral with Weinstein monofilament  Musculosketal:  No Pain to palpation bilateral.  Significant bunion hammertoe and pes planus deformity bilateral right greater than left.  No results for input(s): GRAMSTAIN, LABORGA in the last 8760 hours.  Assessment and Plan:  Problem List Items Addressed This Visit    None    Visit Diagnoses    Pain due to onychomycosis of nail    -  Primary   PAD (peripheral artery disease) ([place])       Type 2 diabetes mellitus with diabetic neuropathy, unspecified whether long term insulin use ([facility])       Skin ulcer of toe of right foot, limited to breakdown of skin ([place])       Healed     -Examined patient  -Right great toe remains healed continue with monitoring daily -Mechanically debrided nails x10  using a sterile nail nipper without incident -Encourage patient daily inspection of feet in setting of diabetes -Return to office in 10 to 12 weeks for follow-up nail care and toe check or sooner problems or issues arise.  Landis Martins, DPM

## 2020-05-15 ENCOUNTER — Encounter (INDEPENDENT_AMBULATORY_CARE_PROVIDER_SITE_OTHER): Payer: PPO | Admitting: Ophthalmology

## 2020-05-15 ENCOUNTER — Other Ambulatory Visit: Payer: Self-pay

## 2020-05-15 DIAGNOSIS — H34831 Tributary (branch) retinal vein occlusion, right eye, with macular edema: Secondary | ICD-10-CM | POA: Diagnosis not present

## 2020-05-15 DIAGNOSIS — H43813 Vitreous degeneration, bilateral: Secondary | ICD-10-CM

## 2020-05-15 DIAGNOSIS — H353132 Nonexudative age-related macular degeneration, bilateral, intermediate dry stage: Secondary | ICD-10-CM

## 2020-05-15 DIAGNOSIS — H35033 Hypertensive retinopathy, bilateral: Secondary | ICD-10-CM | POA: Diagnosis not present

## 2020-05-15 DIAGNOSIS — I10 Essential (primary) hypertension: Secondary | ICD-10-CM

## 2020-05-18 DIAGNOSIS — G4733 Obstructive sleep apnea (adult) (pediatric): Secondary | ICD-10-CM | POA: Diagnosis not present

## 2020-05-19 DIAGNOSIS — E559 Vitamin D deficiency, unspecified: Secondary | ICD-10-CM | POA: Diagnosis not present

## 2020-05-19 DIAGNOSIS — Z79899 Other long term (current) drug therapy: Secondary | ICD-10-CM | POA: Diagnosis not present

## 2020-05-19 DIAGNOSIS — Z23 Encounter for immunization: Secondary | ICD-10-CM | POA: Diagnosis not present

## 2020-05-19 DIAGNOSIS — E7800 Pure hypercholesterolemia, unspecified: Secondary | ICD-10-CM | POA: Diagnosis not present

## 2020-05-19 DIAGNOSIS — E1122 Type 2 diabetes mellitus with diabetic chronic kidney disease: Secondary | ICD-10-CM | POA: Diagnosis not present

## 2020-05-25 DIAGNOSIS — N183 Chronic kidney disease, stage 3 unspecified: Secondary | ICD-10-CM | POA: Diagnosis not present

## 2020-05-25 DIAGNOSIS — E1122 Type 2 diabetes mellitus with diabetic chronic kidney disease: Secondary | ICD-10-CM | POA: Diagnosis not present

## 2020-05-25 DIAGNOSIS — I129 Hypertensive chronic kidney disease with stage 1 through stage 4 chronic kidney disease, or unspecified chronic kidney disease: Secondary | ICD-10-CM | POA: Diagnosis not present

## 2020-05-31 ENCOUNTER — Other Ambulatory Visit: Payer: Self-pay

## 2020-05-31 ENCOUNTER — Ambulatory Visit (INDEPENDENT_AMBULATORY_CARE_PROVIDER_SITE_OTHER): Payer: PPO | Admitting: Sports Medicine

## 2020-05-31 ENCOUNTER — Encounter: Payer: Self-pay | Admitting: Sports Medicine

## 2020-05-31 DIAGNOSIS — L97511 Non-pressure chronic ulcer of other part of right foot limited to breakdown of skin: Secondary | ICD-10-CM | POA: Diagnosis not present

## 2020-05-31 DIAGNOSIS — I739 Peripheral vascular disease, unspecified: Secondary | ICD-10-CM

## 2020-05-31 DIAGNOSIS — E1140 Type 2 diabetes mellitus with diabetic neuropathy, unspecified: Secondary | ICD-10-CM

## 2020-05-31 IMAGING — DX PORTABLE CHEST - 1 VIEW
1 series · 1 of 1 positions shown · non-contrast
Comparison: 11/23/2018

CLINICAL DATA: Increasing oxygen demand

EXAM:
PORTABLE CHEST 1 VIEW

[chest]
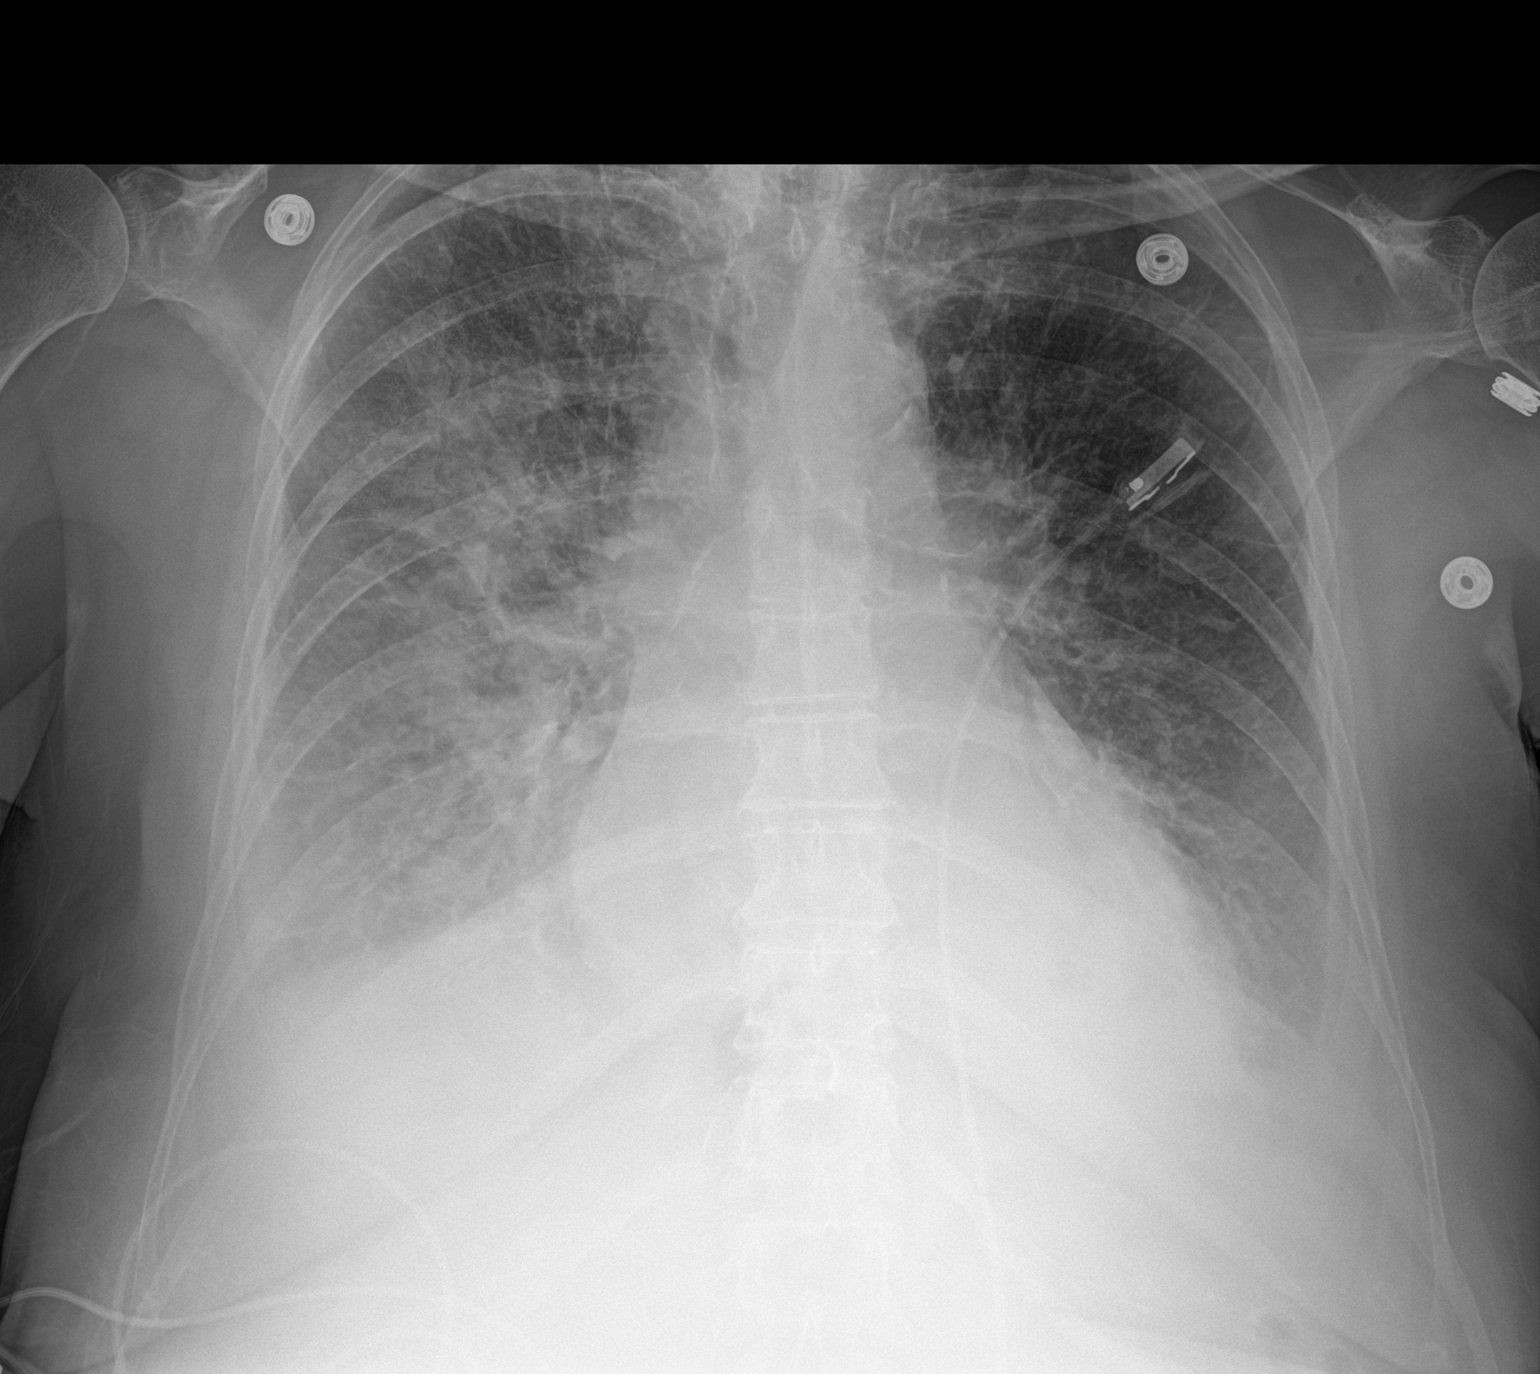

[1 of 1 positions shown; findings below may reference images not displayed]

FINDINGS: Diffuse interstitial opacity with Kerley lines and pleural
effusions. There is alveolar opacity asymmetric to the right.
Borderline heart size.
IMPRESSION: New interstitial and airspace opacity with pleural effusions, a CHF
pattern. Pneumonia was suspected on preceding radiograph and would
be obscured.

## 2020-05-31 NOTE — Progress Notes (Signed)
Subjective: Leah Walsh is a 79 y.o. female patient seen in office for follow-up evaluation of ulceration of the right great toe. Reports that she noticed a dark spot to toe last week. Patient denies any other symptoms. FBS not recorded.   Patient Active Problem List   Diagnosis Date Noted   Hypokalemia 02/17/2019   AKI (acute kidney injury) ([place]) 12/21/2018   Anemia in stage 4 chronic kidney disease ([place]) 12/17/2018   Chronic diastolic heart failure ([place]) 12/16/2018   Renal artery stenosis ([facility]) 12/03/2018   Acute Respiratory failure with hypoxia ([place]) 11/28/2018   Acute renal failure superimposed on stage 4 chronic kidney disease ([place]) 11/24/2018   CAP (community acquired pneumonia) 11/24/2018   Hypertensive heart and kidney disease with chronic diastolic congestive heart failure and stage 4 chronic kidney disease ([place]) 11/24/2018   Hyponatremia 11/24/2018   Nausea vomiting and diarrhea 11/24/2018   Elevated troponin 11/24/2018   Iron deficiency anemia 06/18/2018   Coronary artery disease involving native coronary artery of native heart without angina pectoris 07/03/2017   Pelvic mass 12/20/2016   Hyperphosphatemia 27/01/2375   Metabolic bone disease 28/31/5176   Old MI (myocardial infarction) 03/12/2015   GERD (gastroesophageal reflux disease) 10/07/2014   Insomnia 08/04/2014   Dyspnea on exertion 08/04/2014   Vitamin D deficiency 01/22/2013   Microcytic anemia 01/02/2012   Hypomagnesemia 01/02/2012   Other specified disorders of adrenal gland ([place]) 09/24/2011   Obstructive sleep apnea syndrome 08/06/2011   Asthma, mild persistent 08/06/2011   Insulin-requiring or dependent type II diabetes mellitus ([place]) 08/06/2011   Chronic kidney disease, stage 4 (severe) ([place]) 08/06/2011   History of gastroesophageal reflux (GERD) 08/06/2011   Benign hypertension with CKD (chronic kidney disease) stage IV ([place]) 08/06/2011   Current Outpatient Medications  on File Prior to Visit  Medication Sig Dispense Refill   albuterol (PROVENTIL) (2.5 MG/3ML) 0.083% nebulizer solution Take 2.5 mg by nebulization every 6 (six) hours as needed for wheezing or shortness of breath.     allopurinol (ZYLOPRIM) 100 MG tablet Take 100 mg by mouth daily.  3   aspirin 81 MG tablet Take 81 mg by mouth at bedtime.      atorvastatin (LIPITOR) 80 MG tablet TAKE 1 TABLET (80 MG TOTAL) BY MOUTH AT BEDTIME. 90 tablet 1   B-D UF III MINI PEN NEEDLES 31G X 5 MM MISC   6   budesonide-formoterol (SYMBICORT) 80-4.5 MCG/ACT inhaler SMARTSIG:2 Puff(s) By Mouth Twice Daily     cadexomer iodine (IODOSORB) 0.9 % gel Apply 1 application topically daily as needed for wound care. 40 g 0   carvedilol (COREG) 25 MG tablet TAKE 1 TABLET (25 MG TOTAL) BY MOUTH 2 (TWO) TIMES DAILY WITH A MEAL. 180 tablet 1   Cholecalciferol (VITAMIN D) 2000 UNITS CAPS Take 1 capsule by mouth daily.     cloNIDine (CATAPRES) 0.2 MG tablet Take 0.2 mg by mouth 3 (three) times daily.      Cobalamin Combinations (B12 FOLATE PO) Take 800 mcg by mouth daily.     Coenzyme Q10 (COQ10) 200 MG CAPS Take 1 capsule by mouth daily.     Continuous Blood Gluc Sensor (FREESTYLE LIBRE 14 DAY SENSOR) MISC USE every 14 DAYS as directed     ezetimibe (ZETIA) 10 MG tablet TAKE 1 TABLET (10 MG TOTAL) BY MOUTH DAILY. 90 tablet 1   FEROSUL 325 (65 Fe) MG tablet Take 325 mg by mouth 2 (two) times daily.     hydrALAZINE (APRESOLINE) 50 MG  tablet Take 50 mg by mouth 2 (two) times daily.     insulin lispro (HUMALOG) 100 UNIT/ML injection Inject into the skin.     isosorbide mononitrate (IMDUR) 120 MG 24 hr tablet TAKE 1 TABLET (120 MG TOTAL) BY MOUTH DAILY 90 tablet 1   montelukast (SINGULAIR) 10 MG tablet Take 1 tablet (10 mg total) by mouth daily. 30 tablet 5   Multiple Vitamin (MULTIVITAMIN) tablet Take 1 tablet by mouth daily.     Multiple Vitamins-Minerals (PRESERVISION AREDS 2 PO) Take 1 capsule by mouth 2 (two)  times daily.      nitroGLYCERIN (NITROSTAT) 0.4 MG SL tablet PLACE 1 TABLET (0.4 MG TOTAL) UNDER THE TONGUE EVERY 5 (FIVE) MINUTES AS NEEDED FOR CHEST PAIN. 25 tablet 3   omeprazole (PRILOSEC) 40 MG capsule Take 40 mg by mouth daily.     potassium chloride (KLOR-CON) 10 MEQ tablet Take 10 mEq by mouth every morning.     potassium chloride (KLOR-CON) 10 MEQ tablet Take 10 mEq by mouth every morning.     QUEtiapine (SEROQUEL) 50 MG tablet Take 50 mg by mouth at bedtime.     risperiDONE (RISPERDAL) 0.25 MG tablet Take 0.25 mg by mouth daily.     sertraline (ZOLOFT) 25 MG tablet Take 25 mg by mouth daily.     sulfamethoxazole-trimethoprim (BACTRIM) 400-80 MG tablet Take 1 tablet by mouth 2 (two) times daily. 28 tablet 0   torsemide (DEMADEX) 20 MG tablet Take 20 mg by mouth in the morning, at noon, and at bedtime.      trimethoprim-polymyxin b (POLYTRIM) ophthalmic solution instill ONE DROP IN THE RIGHT EYE FOUR TIMES DAILY FOR 2 DAYS AFTER each monthly eye injection     vitamin B-12 (CYANOCOBALAMIN) 1000 MCG tablet Take 1,000 mcg by mouth every morning.     No current facility-administered medications on file prior to visit.   Allergies  Allergen Reactions   Compazine [Prochlorperazine Edisylate] Other (See Comments)    Stroke like symptoms   Colcrys [Colchicine] Diarrhea   Hydrocodone    Codeine Nausea And Vomiting    No results found for this or any previous visit (from the past 2160 hour(s)).  Objective: There were no vitals filed for this visit.  General: Patient is awake, alert, oriented x 3 and in no acute distress.  Dermatology: Skin is warm and dry bilateral with a dry blood at callus at right great toe after debridement 1x0.4cm opening with granular ulceration. There is no malodor, no active drainage, blanchable erythema, mild edema. No acute signs of infection.  Short thickened bilateral hallux toenails likely consistent with onychomycosis versus traumatic nail  changes secondary to foot type and bunions   Vascular: Dorsalis Pedis pulse = 1/4 Bilateral,  Posterior Tibial pulse = 0/4 Bilateral,  Capillary Fill Time < 5 seconds, 1+ pitting edema bilateral  Neurologic: Protective sensation severely diminished bilateral with Weinstein monofilament  Musculosketal:  No Pain with palpation to ulcerated area.  Significant bunion hammertoe and pes planus deformity bilateral right greater than left.  No results for input(s): GRAMSTAIN, LABORGA in the last 8760 hours.  Assessment and Plan:  Problem List Items Addressed This Visit    None    Visit Diagnoses    Skin ulcer of toe of right foot, limited to breakdown of skin ([place])    -  Primary   PAD (peripheral artery disease) ([place])       Type 2 diabetes mellitus with diabetic neuropathy, unspecified whether long term insulin  use [facility])         -Examined patient and discussed the progression of the wound and treatment alternatives. - Excisionally dedbrided ulceration using chisel blade at right great toe and applied iodosorb and dry dressing. Patient tolerated procedure well without any discomfort or anesthesia necessary for this wound debridement.  -Advised patient to refrain from wearing shoes that can rub the toe - Advised patient to go to the ER or return to office if the wound worsens or if constitutional symptoms are present. -Patient to return to office in 2 weeks for follow up care and evaluation or sooner if problems arise.  Landis Martins, DPM

## 2020-06-03 IMAGING — CR CHEST - 2 VIEW
2 series · 2 of 2 positions shown · non-contrast
Comparison: 11/28/2018

CLINICAL DATA: Shortness of breath

EXAM:
CHEST - 2 VIEW

[chest lat]
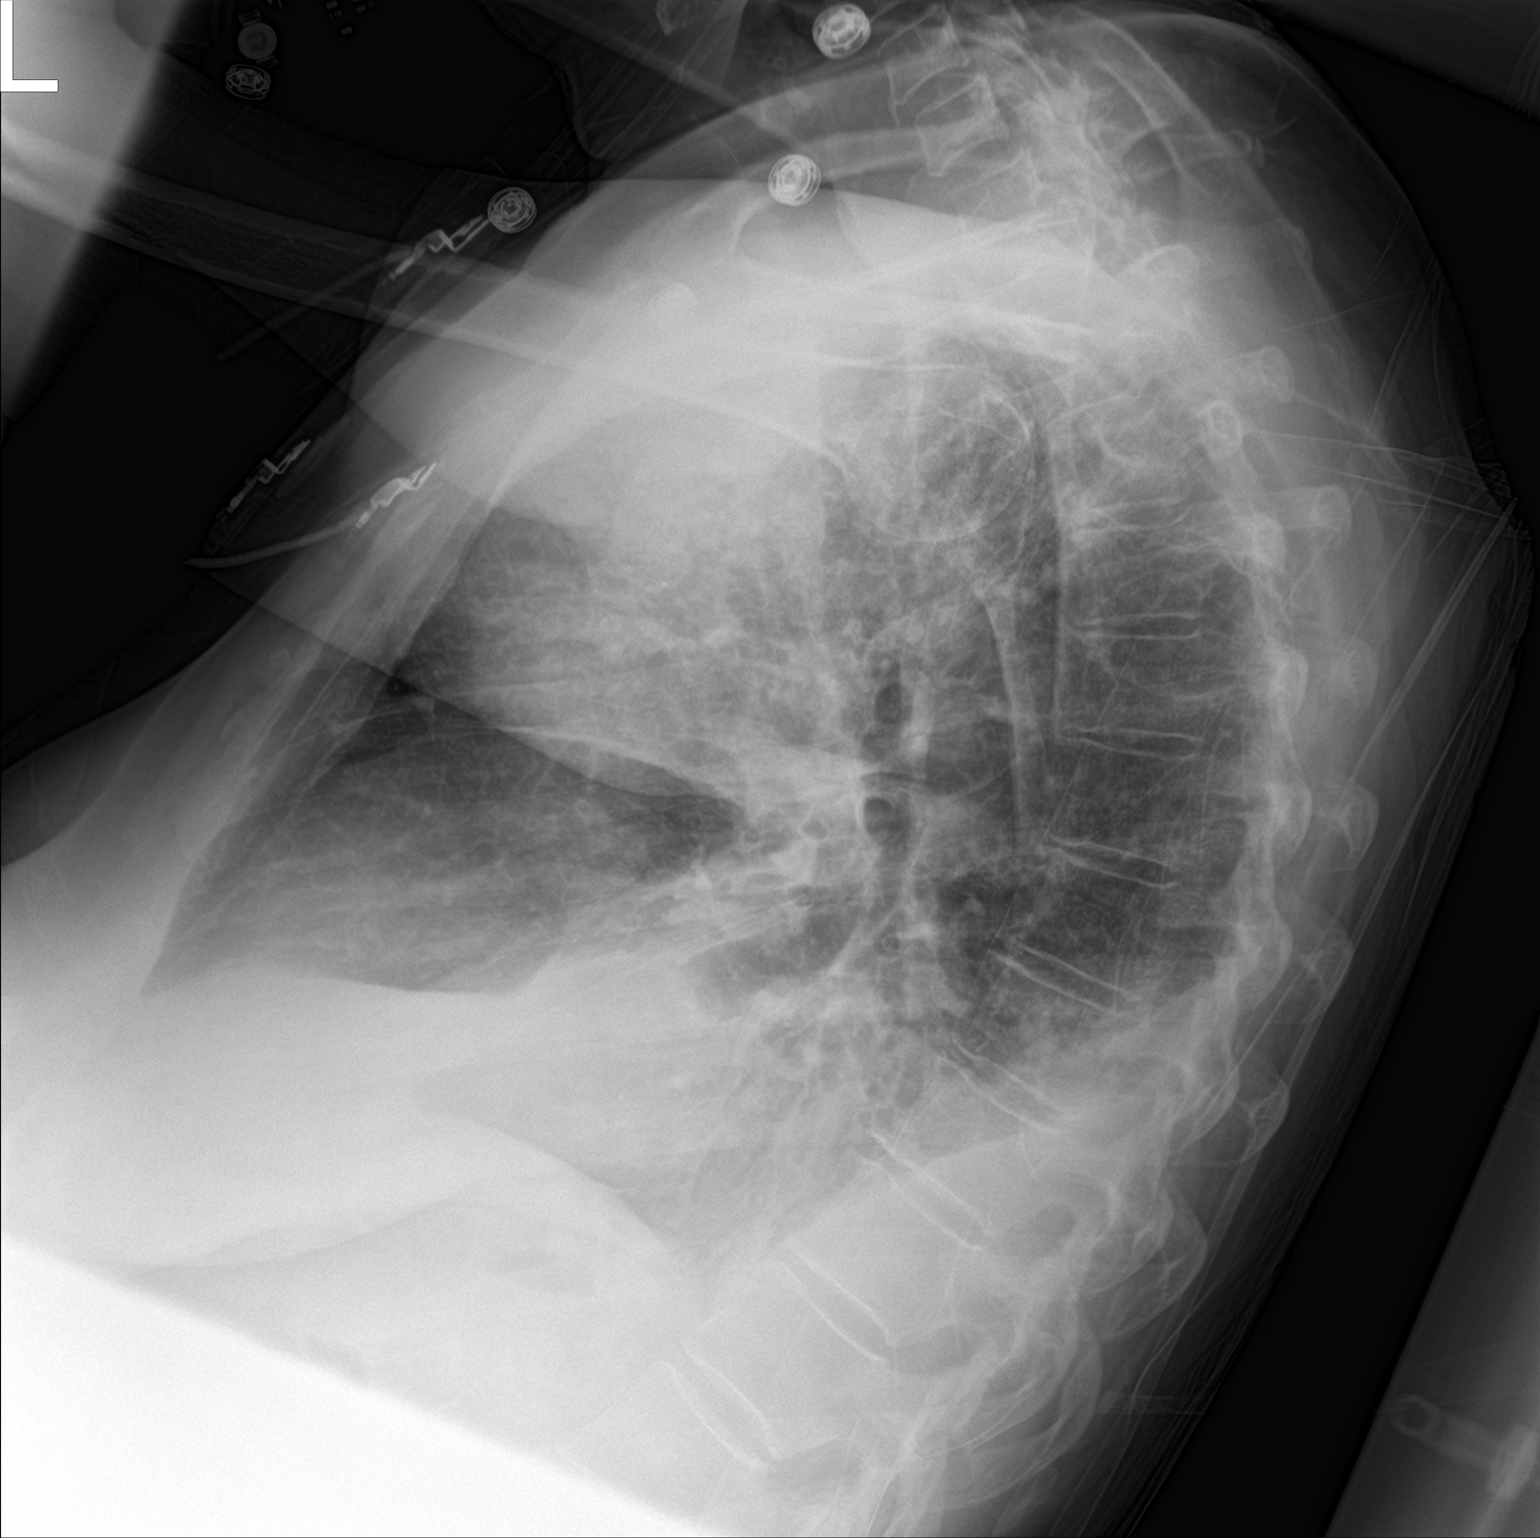

[chest ap]
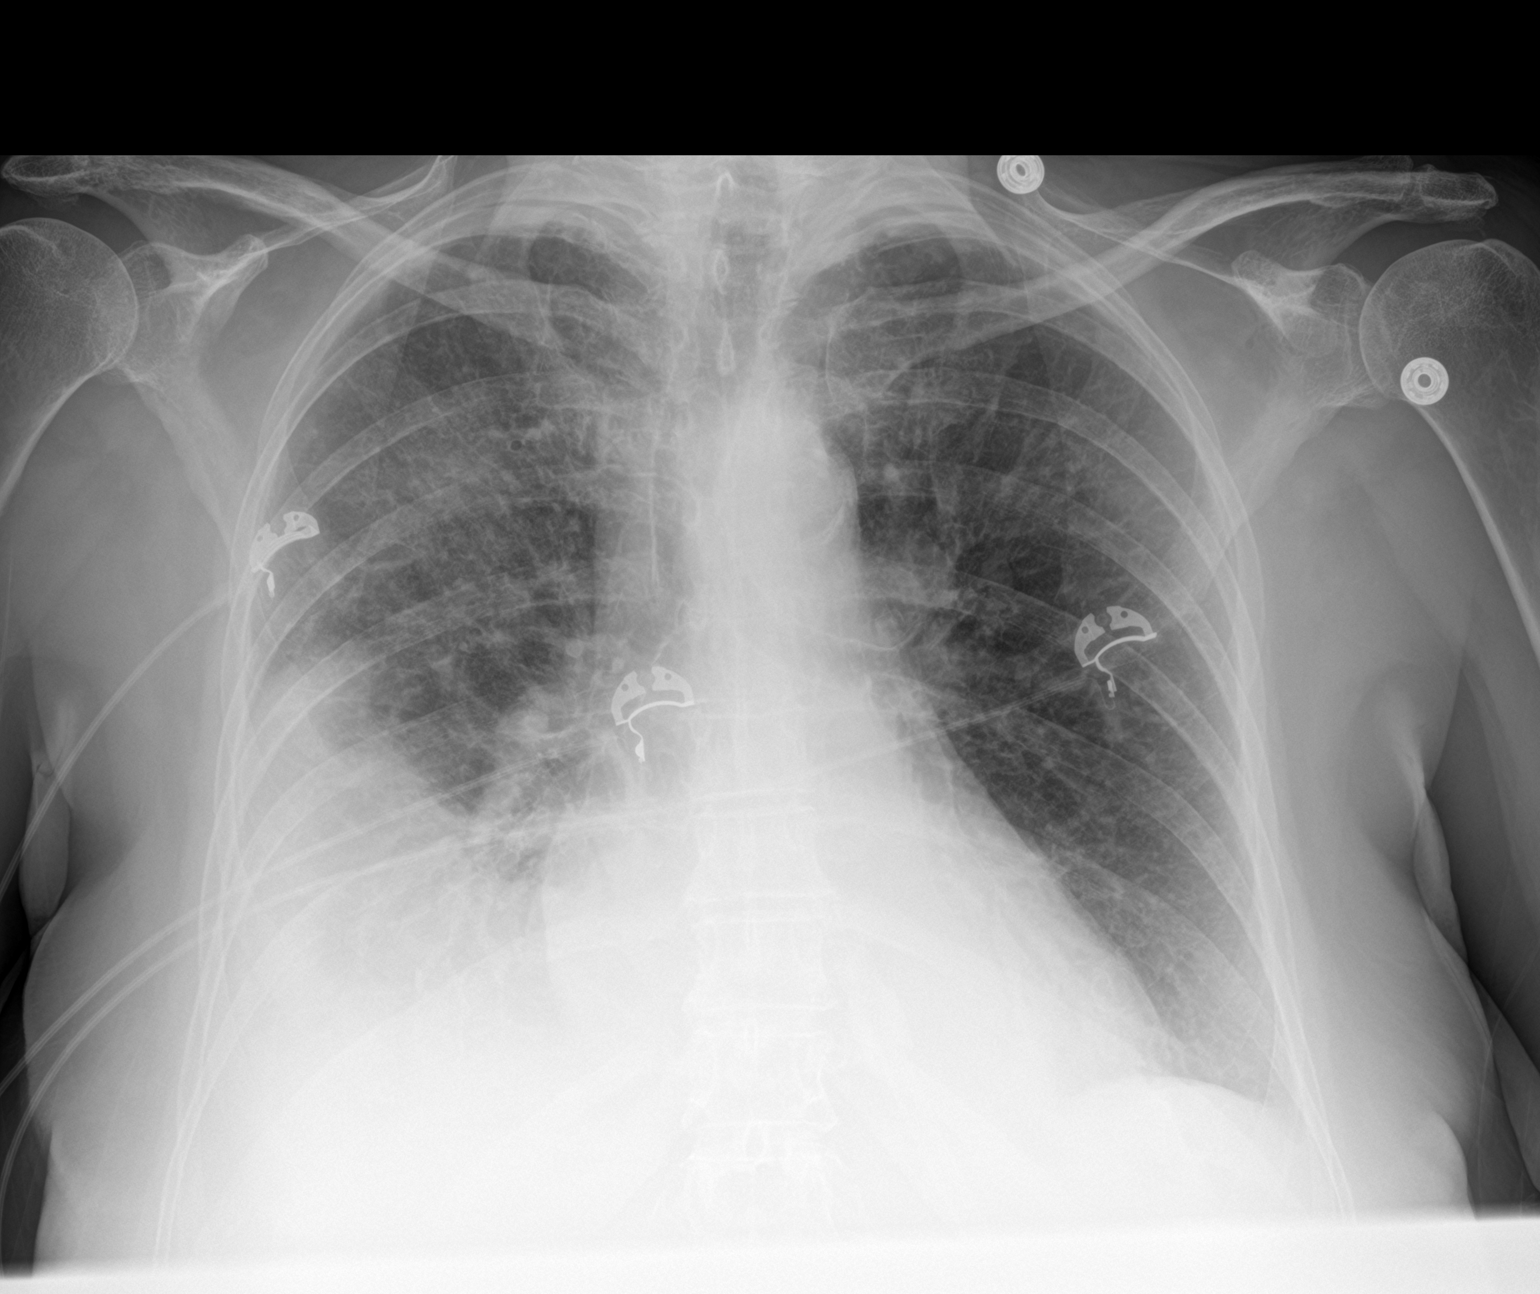

[2 of 2 positions shown; findings below may reference images not displayed]

FINDINGS: Interstitial and alveolar airspace opacities in the right lung.
Bilateral lower lobe airspace disease. Small right pleural effusion.
Right basilar airspace disease. No pneumothorax. Stable
cardiomediastinal silhouette. No aggressive osseous lesion.
IMPRESSION: Bilateral lower lobe airspace disease which may reflect atelectasis
versus pneumonia. Interstitial thickening more severe in the right
lung with a small right pleural effusion. This may reflect
interstitial edema versus infectious etiology.

## 2020-06-12 ENCOUNTER — Encounter (INDEPENDENT_AMBULATORY_CARE_PROVIDER_SITE_OTHER): Payer: PPO | Admitting: Ophthalmology

## 2020-06-12 ENCOUNTER — Other Ambulatory Visit: Payer: Self-pay

## 2020-06-12 DIAGNOSIS — I10 Essential (primary) hypertension: Secondary | ICD-10-CM

## 2020-06-12 DIAGNOSIS — H353221 Exudative age-related macular degeneration, left eye, with active choroidal neovascularization: Secondary | ICD-10-CM

## 2020-06-12 DIAGNOSIS — H34831 Tributary (branch) retinal vein occlusion, right eye, with macular edema: Secondary | ICD-10-CM

## 2020-06-12 DIAGNOSIS — H353112 Nonexudative age-related macular degeneration, right eye, intermediate dry stage: Secondary | ICD-10-CM

## 2020-06-12 DIAGNOSIS — H43813 Vitreous degeneration, bilateral: Secondary | ICD-10-CM

## 2020-06-12 DIAGNOSIS — H35033 Hypertensive retinopathy, bilateral: Secondary | ICD-10-CM

## 2020-06-13 ENCOUNTER — Telehealth: Payer: Self-pay | Admitting: Cardiology

## 2020-06-13 ENCOUNTER — Encounter: Payer: Self-pay | Admitting: Sports Medicine

## 2020-06-13 ENCOUNTER — Ambulatory Visit (INDEPENDENT_AMBULATORY_CARE_PROVIDER_SITE_OTHER): Payer: PPO | Admitting: Sports Medicine

## 2020-06-13 DIAGNOSIS — I130 Hypertensive heart and chronic kidney disease with heart failure and stage 1 through stage 4 chronic kidney disease, or unspecified chronic kidney disease: Secondary | ICD-10-CM

## 2020-06-13 DIAGNOSIS — I5032 Chronic diastolic (congestive) heart failure: Secondary | ICD-10-CM

## 2020-06-13 DIAGNOSIS — I2510 Atherosclerotic heart disease of native coronary artery without angina pectoris: Secondary | ICD-10-CM

## 2020-06-13 DIAGNOSIS — E1140 Type 2 diabetes mellitus with diabetic neuropathy, unspecified: Secondary | ICD-10-CM

## 2020-06-13 DIAGNOSIS — L97511 Non-pressure chronic ulcer of other part of right foot limited to breakdown of skin: Secondary | ICD-10-CM | POA: Diagnosis not present

## 2020-06-13 DIAGNOSIS — I129 Hypertensive chronic kidney disease with stage 1 through stage 4 chronic kidney disease, or unspecified chronic kidney disease: Secondary | ICD-10-CM

## 2020-06-13 DIAGNOSIS — I739 Peripheral vascular disease, unspecified: Secondary | ICD-10-CM

## 2020-06-13 NOTE — Telephone Encounter (Signed)
CMP lipid proBNP

## 2020-06-13 NOTE — Telephone Encounter (Signed)
Spoke to the patient just now and let her know that I have put in the order for her lab work and she can come in to complete it the week prior to her appointment. She verbalizes understanding and thanks me for the call back.    Encouraged patient to call back with any questions or concerns.

## 2020-06-13 NOTE — Progress Notes (Signed)
Subjective: Leah Walsh is a 79 y.o. female patient seen in office for follow-up evaluation of ulceration of the right great toe. Reports that she is doing ok went to the beach and has pain a little at the toe. Patient denies any other symptoms. FBS not recorded.   Patient Active Problem List   Diagnosis Date Noted  . Hypokalemia 02/17/2019  . AKI (acute kidney injury) ([place]) 12/21/2018  . Anemia in stage 4 chronic kidney disease ([place]) 12/17/2018  . Chronic diastolic heart failure ([place]) 12/16/2018  . Renal artery stenosis ([place]) 12/03/2018  . Acute Respiratory failure with hypoxia ([place]) 11/28/2018  . Acute renal failure superimposed on stage 4 chronic kidney disease ([place]) 11/24/2018  . CAP (community acquired pneumonia) 11/24/2018  . Hypertensive heart and kidney disease with chronic diastolic congestive heart failure and stage 4 chronic kidney disease ([place]) 11/24/2018  . Hyponatremia 11/24/2018  . Nausea vomiting and diarrhea 11/24/2018  . Elevated troponin 11/24/2018  . Iron deficiency anemia 06/18/2018  . Coronary artery disease involving native coronary artery of native heart without angina pectoris 07/03/2017  . Pelvic mass 12/20/2016  . Hyperphosphatemia 08/22/2016  . Metabolic bone disease 55/73/2202  . Old MI (myocardial infarction) 03/12/2015  . GERD (gastroesophageal reflux disease) 10/07/2014  . Insomnia 08/04/2014  . Dyspnea on exertion 08/04/2014  . Vitamin D deficiency 01/22/2013  . Microcytic anemia 01/02/2012  . Hypomagnesemia 01/02/2012  . Other specified disorders of adrenal gland ([place]) 09/24/2011  . Obstructive sleep apnea syndrome 08/06/2011  . Asthma, mild persistent 08/06/2011  . Insulin-requiring or dependent type II diabetes mellitus ([place]) 08/06/2011  . Chronic kidney disease, stage 4 (severe) ([place]) 08/06/2011  . History of gastroesophageal reflux (GERD) 08/06/2011  . Benign hypertension with CKD (chronic kidney disease) stage IV ([place]) 08/06/2011    Current Outpatient Medications on File Prior to Visit  Medication Sig Dispense Refill  . albuterol (PROVENTIL) (2.5 MG/3ML) 0.083% nebulizer solution Take 2.5 mg by nebulization every 6 (six) hours as needed for wheezing or shortness of breath.    . allopurinol (ZYLOPRIM) 100 MG tablet Take 100 mg by mouth daily.  3  . aspirin 81 MG tablet Take 81 mg by mouth at bedtime.     [name] atorvastatin (LIPITOR) 80 MG tablet TAKE 1 TABLET (80 MG TOTAL) BY MOUTH AT BEDTIME. 90 tablet 1  . B-D UF III MINI PEN NEEDLES 31G X 5 MM MISC   6  . budesonide-formoterol (SYMBICORT) 80-4.5 MCG/ACT inhaler SMARTSIG:2 Puff(s) By Mouth Twice Daily    . cadexomer iodine (IODOSORB) 0.9 % gel Apply 1 application topically daily as needed for wound care. 40 g 0  . carvedilol (COREG) 25 MG tablet TAKE 1 TABLET (25 MG TOTAL) BY MOUTH 2 (TWO) TIMES DAILY WITH A MEAL. 180 tablet 1  . Cholecalciferol (VITAMIN D) 2000 UNITS CAPS Take 1 capsule by mouth daily.    . cloNIDine (CATAPRES) 0.2 MG tablet Take 0.2 mg by mouth 3 (three) times daily.     . Cobalamin Combinations (B12 FOLATE PO) Take 800 mcg by mouth daily.    . Coenzyme Q10 (COQ10) 200 MG CAPS Take 1 capsule by mouth daily.    . Continuous Blood Gluc Sensor (FREESTYLE LIBRE 14 DAY SENSOR) MISC USE every 14 DAYS as directed    . ezetimibe (ZETIA) 10 MG tablet TAKE 1 TABLET (10 MG TOTAL) BY MOUTH DAILY. 90 tablet 1  . FEROSUL 325 (65 Fe) MG tablet Take 325 mg by mouth 2 (two) times daily.    [name]  hydrALAZINE (APRESOLINE) 50 MG tablet Take 50 mg by mouth 2 (two) times daily.    . insulin lispro (HUMALOG) 100 UNIT/ML injection Inject into the skin.    [name] isosorbide mononitrate (IMDUR) 120 MG 24 hr tablet TAKE 1 TABLET (120 MG TOTAL) BY MOUTH DAILY 90 tablet 1  . montelukast (SINGULAIR) 10 MG tablet Take 1 tablet (10 mg total) by mouth daily. 30 tablet 5  . Multiple Vitamin (MULTIVITAMIN) tablet Take 1 tablet by mouth daily.    . Multiple Vitamins-Minerals (PRESERVISION AREDS 2  PO) Take 1 capsule by mouth 2 (two) times daily.     . nitroGLYCERIN (NITROSTAT) 0.4 MG SL tablet PLACE 1 TABLET (0.4 MG TOTAL) UNDER THE TONGUE EVERY 5 (FIVE) MINUTES AS NEEDED FOR CHEST PAIN. 25 tablet 3  . omeprazole (PRILOSEC) 40 MG capsule Take 40 mg by mouth daily.    . potassium chloride (KLOR-CON) 10 MEQ tablet Take 10 mEq by mouth every morning.    . potassium chloride (KLOR-CON) 10 MEQ tablet Take 10 mEq by mouth every morning.    [name] QUEtiapine (SEROQUEL) 50 MG tablet Take 50 mg by mouth at bedtime.    . risperiDONE (RISPERDAL) 0.25 MG tablet Take 0.25 mg by mouth daily.    . sertraline (ZOLOFT) 25 MG tablet Take 25 mg by mouth daily.    [name] sulfamethoxazole-trimethoprim (BACTRIM) 400-80 MG tablet Take 1 tablet by mouth 2 (two) times daily. 28 tablet 0  . torsemide (DEMADEX) 20 MG tablet Take 20 mg by mouth in the morning, at noon, and at bedtime.     [name] trimethoprim-polymyxin b (POLYTRIM) ophthalmic solution instill ONE DROP IN THE RIGHT EYE FOUR TIMES DAILY FOR 2 DAYS AFTER each monthly eye injection    . vitamin B-12 (CYANOCOBALAMIN) 1000 MCG tablet Take 1,000 mcg by mouth every morning.     No current facility-administered medications on file prior to visit.   Allergies  Allergen Reactions  . Compazine [Prochlorperazine Edisylate] Other (See Comments)    Stroke like symptoms  . Colcrys [Colchicine] Diarrhea  . Hydrocodone   . Codeine Nausea And Vomiting    No results found for this or any previous visit (from the past 2160 hour(s)).  Objective: There were no vitals filed for this visit.  General: Patient is awake, alert, oriented x 3 and in no acute distress.  Dermatology: Skin is warm and dry bilateral with a dry blood at callus at right great toe with dry heme. There is no malodor, no active drainage, blanchable erythema, mild edema. No acute signs of infection.  Short thickened bilateral hallux toenails likely consistent with onychomycosis versus traumatic nail changes  secondary to foot type and bunions   Vascular: Dorsalis Pedis pulse = 1/4 Bilateral,  Posterior Tibial pulse = 0/4 Bilateral,  Capillary Fill Time < 5 seconds, 1+ pitting edema bilateral  Neurologic: Protective sensation severely diminished bilateral with Weinstein monofilament.  Musculosketal:  Minimal pain with palpation to ulcerated area.  Significant bunion hammertoe and pes planus deformity bilateral right greater than left.  No results for input(s): GRAMSTAIN, LABORGA in the last 8760 hours.  Assessment and Plan:  Problem List Items Addressed This Visit    None    Visit Diagnoses    Skin ulcer of toe of right foot, limited to breakdown of skin ([place])    -  Primary   Relevant Orders   WOUND CULTURE   PAD (peripheral artery disease) (HCC)       Type 2 diabetes mellitus  with diabetic neuropathy, unspecified whether long term insulin use ([place])         -Examined patient and discussed the progression of the wound and treatment alternatives. - Excisionally dedbrided ulceration using chisel blade at right great toe and applied iodosorb and dry dressing. Patient tolerated procedure well without any discomfort or anesthesia necessary for this wound debridement.  -Wound culture obtained and we will see if she needs any additional antibiotics, we will call if needed  -Advised patient to refrain from wearing shoes that can rub the toe like before - Advised patient to go to the ER or return to office if the wound worsens or if constitutional symptoms are present. -Patient to return to office in 2 weeks for follow up care and evaluation or sooner if problems arise.  Landis Martins, DPM

## 2020-06-13 NOTE — Telephone Encounter (Signed)
Leah Walsh is requesting labs during her upcoming appt. Please advise.

## 2020-06-17 DIAGNOSIS — G4733 Obstructive sleep apnea (adult) (pediatric): Secondary | ICD-10-CM | POA: Diagnosis not present

## 2020-06-18 ENCOUNTER — Encounter: Payer: Self-pay | Admitting: Internal Medicine

## 2020-06-19 LAB — WOUND CULTURE: Organism ID, Bacteria: NONE SEEN

## 2020-06-20 ENCOUNTER — Encounter: Payer: Self-pay | Admitting: Internal Medicine

## 2020-06-20 ENCOUNTER — Other Ambulatory Visit: Payer: Self-pay

## 2020-06-20 ENCOUNTER — Ambulatory Visit: Payer: PPO | Admitting: Internal Medicine

## 2020-06-20 DIAGNOSIS — G4733 Obstructive sleep apnea (adult) (pediatric): Secondary | ICD-10-CM | POA: Diagnosis not present

## 2020-06-20 DIAGNOSIS — I5032 Chronic diastolic (congestive) heart failure: Secondary | ICD-10-CM | POA: Diagnosis not present

## 2020-06-20 NOTE — Assessment & Plan Note (Signed)
Not in evident fluid overload at this visit, without R CHF signs. Followed by cardiology and nephrology.

## 2020-06-20 NOTE — Assessment & Plan Note (Signed)
Benefits from CPAP with good compliance and control now. Plan- continue auto 5-15

## 2020-06-20 NOTE — Patient Instructions (Signed)
We can continue CPAP auto 5-15 ° °Please call if we can help °

## 2020-06-20 NOTE — Progress Notes (Signed)
HPI female never smoker followed for OSA, complicated by CAD/MI/stent, asthma (Dr. Neldon Mc), DM 2, CKD4 NPSG 12/05/10([place]) AHI 14.2/ hr, weight 178 lbs.  PFT 11/07/14-  Moderate Diffusion deficit 60%. Normal flows and lung volumes with insignificant response to bronchodilator. a1AT- 10/07/14-  Nl MM 123 OK  --------------------------------------------------------------------------------------------  12/07/19- 79 year old female never smoker followed for OSA, complicated by CAD/MI/stent, Chronic Diastolic CHF, asthma (Dr. Neldon Mc), DM 2, HBP, CKD4 CPAP auto 5-15/[facility] Started Seroquel last night- feels "loopy" today, CBG here today 258, Arrival BP 90/42 HR 91. Husband here. Download - Uses every night, but 4 hr compliance 56.7%. AHI 3/ hr. Body weight today 162 lbs Comfortable with CPAP. Getting supplies from [place]. Usually usess just around 4 hrs. Claims that's all she sleeps- discussed. Not resting well. Having low BP. CXR 05/04/2019- No active cardiopulmonary disease.  06/20/20-  79 year old female never smoker followed for OSA, complicated by CAD/MI/stent, Chronic Diastolic CHF, asthma (Dr. Neldon Mc), DM 2, HBP, CKD5 CPAP auto 5-15/ [facility]/ [facility] Download- compliance 80%, 0.7/ hr Body weight today- 166 lbs Covid vax-2 Moderna Flu vax- Had She is very please with replacement machine and under-nose mask. Sleeps well. Hosp twice this summer- fluid overload, then weakness from meds. Better now. Plans Covax booster- discussed. CXR 02/10/20-  FINDINGS: Trace pleural effusions. No consolidation. Stable cardiomediastinal silhouette. Aortic atherosclerosis. No pneumothorax. IMPRESSION: Trace pleural effusions.  ROS see HPI    + = positive Constitutional:   No-   weight loss, night sweats, fevers, chills, fatigue, lassitude. HEENT:   No-  headaches, difficulty swallowing, tooth/dental problems, sore throat,       No-  sneezing, itching, ear ache, + nasal congestion, post nasal drip,  CV:  No-    chest pain, orthopnea, PND, + swelling in lower extremities, anasarca,                                            dizziness, + palpitations Resp: No-   shortness of breath with exertion or at rest.              No-   productive cough,  No non-productive cough,  No- coughing up of blood.              No-   change in color of mucus.  No- wheezing.   Skin: No-   rash or lesions. GI:  No-   heartburn, + indigestion, abdominal pain, nausea, vomiting,  GU:  MS:  + joint pain or swelling.   Neuro-     nothing unusual Psych:  No- change in mood or affect. No depression or anxiety.  No memory loss.  OBJ- Physical Exam General- Alert, Oriented, Affect-appropriate, Distress- none acute,  Skin- rash-none, lesions- none, excoriation- none Lymphadenopathy- none Head- atraumatic            Eyes- Gross vision intact, PERRLA, conjunctivae and secretions clear            Ears- Hearing, canals-normal            Nose- Clear, no-Septal dev, mucus, polyps, erosion, perforation             Throat- Mallampati III , mucosa clear , drainage- none, tonsils- atrophic Neck- flexible , trachea midline, no stridor , thyroid nl, carotid no bruit Chest - symmetrical excursion , unlabored           Heart/CV-  RRR , no murmur , no gallop  , no rub, nl s1 s2                           - JVD- none , edema- none, stasis changes- none, varices- none           Lung- clear to P&A, wheeze- none, cough- none , dullness+ minimal at bases, rub- none           Chest wall-  Abd-  Br/ Gen/ Rectal- Not done, not indicated Extrem- cyanosis- none, clubbing, none, atrophy- none, strength- nl. Neuro- grossly intact to observation

## 2020-06-24 DIAGNOSIS — E1122 Type 2 diabetes mellitus with diabetic chronic kidney disease: Secondary | ICD-10-CM | POA: Diagnosis not present

## 2020-06-24 DIAGNOSIS — I129 Hypertensive chronic kidney disease with stage 1 through stage 4 chronic kidney disease, or unspecified chronic kidney disease: Secondary | ICD-10-CM | POA: Diagnosis not present

## 2020-06-24 DIAGNOSIS — N183 Chronic kidney disease, stage 3 unspecified: Secondary | ICD-10-CM | POA: Diagnosis not present

## 2020-06-28 ENCOUNTER — Ambulatory Visit: Payer: PPO | Admitting: Sports Medicine

## 2020-06-28 ENCOUNTER — Other Ambulatory Visit: Payer: Self-pay

## 2020-06-28 ENCOUNTER — Encounter: Payer: Self-pay | Admitting: Sports Medicine

## 2020-06-28 DIAGNOSIS — I739 Peripheral vascular disease, unspecified: Secondary | ICD-10-CM

## 2020-06-28 DIAGNOSIS — L97511 Non-pressure chronic ulcer of other part of right foot limited to breakdown of skin: Secondary | ICD-10-CM | POA: Diagnosis not present

## 2020-06-28 DIAGNOSIS — E1140 Type 2 diabetes mellitus with diabetic neuropathy, unspecified: Secondary | ICD-10-CM

## 2020-06-28 NOTE — Progress Notes (Signed)
Subjective: Leah Walsh is a 79 y.o. female patient seen in office for follow-up evaluation of ulceration of the right great toe. Reports that she is doing better. No drainage and very little pain. Patient denies any other symptoms. FBS not recorded.   Patient Active Problem List   Diagnosis Date Noted  . Hypokalemia 02/17/2019  . AKI (acute kidney injury) ([place]) 12/21/2018  . Anemia in stage 4 chronic kidney disease ([place]) 12/17/2018  . Chronic diastolic heart failure ([place]) 12/16/2018  . Renal artery stenosis ([place]) 12/03/2018  . Acute Respiratory failure with hypoxia ([place]) 11/28/2018  . Acute renal failure superimposed on stage 4 chronic kidney disease ([place]) 11/24/2018  . CAP (community acquired pneumonia) 11/24/2018  . Hypertensive heart and kidney disease with chronic diastolic congestive heart failure and stage 4 chronic kidney disease ([place]) 11/24/2018  . Hyponatremia 11/24/2018  . Nausea vomiting and diarrhea 11/24/2018  . Elevated troponin 11/24/2018  . Iron deficiency anemia 06/18/2018  . Coronary artery disease involving native coronary artery of native heart without angina pectoris 07/03/2017  . Pelvic mass 12/20/2016  . Hyperphosphatemia 08/22/2016  . Metabolic bone disease 09/32/3557  . Old MI (myocardial infarction) 03/12/2015  . GERD (gastroesophageal reflux disease) 10/07/2014  . Insomnia 08/04/2014  . Dyspnea on exertion 08/04/2014  . Vitamin D deficiency 01/22/2013  . Microcytic anemia 01/02/2012  . Hypomagnesemia 01/02/2012  . Other specified disorders of adrenal gland ([place]) 09/24/2011  . Obstructive sleep apnea syndrome 08/06/2011  . Asthma, mild persistent 08/06/2011  . Insulin-requiring or dependent type II diabetes mellitus ([place]) 08/06/2011  . Chronic kidney disease, stage 4 (severe) ([place]) 08/06/2011  . History of gastroesophageal reflux (GERD) 08/06/2011  . Benign hypertension with CKD (chronic kidney disease) stage IV ([place]) 08/06/2011   Current Outpatient  Medications on File Prior to Visit  Medication Sig Dispense Refill  . albuterol (PROVENTIL) (2.5 MG/3ML) 0.083% nebulizer solution Take 2.5 mg by nebulization every 6 (six) hours as needed for wheezing or shortness of breath.    . allopurinol (ZYLOPRIM) 100 MG tablet Take 100 mg by mouth daily.  3  . aspirin 81 MG tablet Take 81 mg by mouth at bedtime.     [name] atorvastatin (LIPITOR) 80 MG tablet TAKE 1 TABLET (80 MG TOTAL) BY MOUTH AT BEDTIME. 90 tablet 1  . B-D UF III MINI PEN NEEDLES 31G X 5 MM MISC   6  . budesonide-formoterol (SYMBICORT) 80-4.5 MCG/ACT inhaler SMARTSIG:2 Puff(s) By Mouth Twice Daily    . cadexomer iodine (IODOSORB) 0.9 % gel Apply 1 application topically daily as needed for wound care. 40 g 0  . carvedilol (COREG) 25 MG tablet TAKE 1 TABLET (25 MG TOTAL) BY MOUTH 2 (TWO) TIMES DAILY WITH A MEAL. 180 tablet 1  . Cholecalciferol (VITAMIN D) 2000 UNITS CAPS Take 1 capsule by mouth daily.    . cloNIDine (CATAPRES) 0.2 MG tablet Take 0.2 mg by mouth 3 (three) times daily.     . Cobalamin Combinations (B12 FOLATE PO) Take 800 mcg by mouth daily.    . Coenzyme Q10 (COQ10) 200 MG CAPS Take 1 capsule by mouth daily.    . Continuous Blood Gluc Sensor (FREESTYLE LIBRE 14 DAY SENSOR) MISC USE every 14 DAYS as directed    . ezetimibe (ZETIA) 10 MG tablet TAKE 1 TABLET (10 MG TOTAL) BY MOUTH DAILY. 90 tablet 1  . FEROSUL 325 (65 Fe) MG tablet Take 325 mg by mouth 2 (two) times daily.    . hydrALAZINE (APRESOLINE) 50  MG tablet Take 50 mg by mouth 2 (two) times daily.    . insulin lispro (HUMALOG) 100 UNIT/ML injection Inject into the skin.    [name] isosorbide mononitrate (IMDUR) 120 MG 24 hr tablet TAKE 1 TABLET (120 MG TOTAL) BY MOUTH DAILY 90 tablet 1  . montelukast (SINGULAIR) 10 MG tablet Take 1 tablet (10 mg total) by mouth daily. 30 tablet 5  . Multiple Vitamin (MULTIVITAMIN) tablet Take 1 tablet by mouth daily.    . Multiple Vitamins-Minerals (PRESERVISION AREDS 2 PO) Take 1 capsule by  mouth 2 (two) times daily.     . nitroGLYCERIN (NITROSTAT) 0.4 MG SL tablet PLACE 1 TABLET (0.4 MG TOTAL) UNDER THE TONGUE EVERY 5 (FIVE) MINUTES AS NEEDED FOR CHEST PAIN. 25 tablet 3  . omeprazole (PRILOSEC) 40 MG capsule Take 40 mg by mouth daily.    . potassium chloride (KLOR-CON) 10 MEQ tablet Take 10 mEq by mouth every morning.    . potassium chloride (KLOR-CON) 10 MEQ tablet Take 10 mEq by mouth every morning.    [name] QUEtiapine (SEROQUEL) 50 MG tablet Take 50 mg by mouth at bedtime.    . risperiDONE (RISPERDAL) 0.25 MG tablet Take 0.25 mg by mouth daily.    . sertraline (ZOLOFT) 25 MG tablet Take 25 mg by mouth daily.    [name] sulfamethoxazole-trimethoprim (BACTRIM) 400-80 MG tablet Take 1 tablet by mouth 2 (two) times daily. 28 tablet 0  . torsemide (DEMADEX) 20 MG tablet Take 20 mg by mouth in the morning, at noon, and at bedtime.     [name] trimethoprim-polymyxin b (POLYTRIM) ophthalmic solution instill ONE DROP IN THE RIGHT EYE FOUR TIMES DAILY FOR 2 DAYS AFTER each monthly eye injection    . vitamin B-12 (CYANOCOBALAMIN) 1000 MCG tablet Take 1,000 mcg by mouth every morning.     No current facility-administered medications on file prior to visit.   Allergies  Allergen Reactions  . Compazine [Prochlorperazine Edisylate] Other (See Comments)    Stroke like symptoms  . Colcrys [Colchicine] Diarrhea  . Hydrocodone   . Codeine Nausea And Vomiting    Recent Results (from the past 2160 hour(s))  WOUND CULTURE     Status: None   Collection Time: 06/13/20  2:03 PM   Specimen: Foot, Right; Wound   Wound Culture and sens  Result Value Ref Range   Gram Stain Result Final report    Organism ID, Bacteria Comment     Comment: No white blood cells seen.   Organism ID, Bacteria No organisms seen    Aerobic Bacterial Culture Final report    Organism ID, Bacteria Comment     Comment: No growth in 36 - 48 hours.    Objective: There were no vitals filed for this visit.  General: Patient is  awake, alert, oriented x 3 and in no acute distress.  Dermatology: Skin is warm and dry bilateral with a dry blood at callus at right great toe with dry heme upon debridement there in no opening; area is healed. There is no malodor, no active drainage, blanchable erythema, mild edema. No acute signs of infection.  Short thickened bilateral hallux toenails likely consistent with onychomycosis versus traumatic nail changes secondary to foot type and bunions   Vascular: Dorsalis Pedis pulse = 1/4 Bilateral,  Posterior Tibial pulse = 0/4 Bilateral,  Capillary Fill Time < 5 seconds, 1+ pitting edema bilateral  Neurologic: Protective sensation severely diminished bilateral with Weinstein monofilament.  Musculosketal:  Minimal pain with palpation to  right great toe. Significant bunion hammertoe and pes planus deformity bilateral right greater than left.  No results for input(s): GRAMSTAIN, LABORGA in the last 8760 hours.  Assessment and Plan:  Problem List Items Addressed This Visit    None    Visit Diagnoses    Skin ulcer of toe of right foot, limited to breakdown of skin ([place])    -  Primary   PAD (peripheral artery disease) ([place])       Type 2 diabetes mellitus with diabetic neuropathy, unspecified whether long term insulin use ([place])         -Examined patient and discussed the progression of the wound and treatment alternatives. - Excisionally dedbrided ulceration using chisel blade at right great toe; wound is healed; advised patient to continue with 1-2 using dry protective dressing and then after may discontinue and wear good supportive shoes that do not rub - Advised patient to go to the ER or return to office if the wound worsens or if constitutional symptoms are present. -Patient to return to office in 3-4 weeks for follow up care and evaluation or sooner if problems arise.  Landis Martins, DPM

## 2020-07-04 DIAGNOSIS — E1122 Type 2 diabetes mellitus with diabetic chronic kidney disease: Secondary | ICD-10-CM | POA: Diagnosis not present

## 2020-07-04 DIAGNOSIS — Z6824 Body mass index (BMI) 24.0-24.9, adult: Secondary | ICD-10-CM | POA: Diagnosis not present

## 2020-07-06 DIAGNOSIS — J45909 Unspecified asthma, uncomplicated: Secondary | ICD-10-CM | POA: Insufficient documentation

## 2020-07-06 DIAGNOSIS — E119 Type 2 diabetes mellitus without complications: Secondary | ICD-10-CM | POA: Insufficient documentation

## 2020-07-06 DIAGNOSIS — N289 Disorder of kidney and ureter, unspecified: Secondary | ICD-10-CM | POA: Insufficient documentation

## 2020-07-06 DIAGNOSIS — I10 Essential (primary) hypertension: Secondary | ICD-10-CM | POA: Insufficient documentation

## 2020-07-06 DIAGNOSIS — G4730 Sleep apnea, unspecified: Secondary | ICD-10-CM | POA: Insufficient documentation

## 2020-07-06 DIAGNOSIS — E785 Hyperlipidemia, unspecified: Secondary | ICD-10-CM | POA: Insufficient documentation

## 2020-07-10 ENCOUNTER — Other Ambulatory Visit: Payer: Self-pay

## 2020-07-10 ENCOUNTER — Encounter (INDEPENDENT_AMBULATORY_CARE_PROVIDER_SITE_OTHER): Payer: PPO | Admitting: Ophthalmology

## 2020-07-10 DIAGNOSIS — H35033 Hypertensive retinopathy, bilateral: Secondary | ICD-10-CM

## 2020-07-10 DIAGNOSIS — I10 Essential (primary) hypertension: Secondary | ICD-10-CM

## 2020-07-10 DIAGNOSIS — H43813 Vitreous degeneration, bilateral: Secondary | ICD-10-CM | POA: Diagnosis not present

## 2020-07-10 DIAGNOSIS — H353132 Nonexudative age-related macular degeneration, bilateral, intermediate dry stage: Secondary | ICD-10-CM

## 2020-07-10 DIAGNOSIS — H34831 Tributary (branch) retinal vein occlusion, right eye, with macular edema: Secondary | ICD-10-CM

## 2020-07-10 NOTE — Progress Notes (Signed)
Cardiology Office Note:    Date:  07/11/2020   ID:  Leah Walsh, DOB Mar 24, 1941, MRN [id number]  PCP:  [name], MD  Cardiologist:  Shirlee More, MD    Referring MD: [name], MD    ASSESSMENT:    1. Coronary artery disease involving native coronary artery of native heart without angina pectoris   2. Hypertensive heart and kidney disease with chronic diastolic congestive heart failure and stage 4 chronic kidney disease ([place])   3. Hyperlipidemia, unspecified hyperlipidemia type   4. Anemia due to chronic renal failure treated with erythropoietin, stage 4 (severe) (HCC)    PLAN:    In order of problems listed above:  1. Stable CAD having no angina on current medical therapy continue aspirin or high intensity statin lipids are at target along with Zetia and beta-blocker 2. Stable repeat blood pressure mildly elevated mildly decompensated heart failure increase her diuretic extra tablet furosemide 40 mg daily 2 days/week which should also bring her blood pressure to target 3. Ideal lipids continue combined atorvastatin and Zetia 4. Stable managed by nephrology   Next appointment: 6 months   Medication Adjustments/Labs and Tests Ordered: Current medicines are reviewed at length with the patient today.  Concerns regarding medicines are outlined above.  No orders of the defined types were placed in this encounter.  Meds ordered this encounter  Medications  . furosemide (LASIX) 40 MG tablet    Sig: Take 1 tablet (40 mg total) by mouth 2 (two) times daily.    Dispense:  180 tablet    Refill:  3    Chief Complaint  Patient presents with  . Follow-up  . Coronary Artery Disease    History of Present Illness:    Kentrell Guettler is a 79 y.o. female with a hx of CAD with previous PCI and stent LAD stage IV CKD renal artery stenosis and hypertension followed by [facility] nephrology.  She has also had anemia in the past requiring transfusion managed by  nephrology.  She was last seen by me 12/21/2019.  Was also seen by pulmonary Dr. Baird Lyons for obstructive sleep apnea and asthma.  She was seen by nephrology [facility] 03/15/2020 potassium 4.6 creatinine 2.67 GFR 16 cc her ferritin level was 58 iron 31 percentage saturation 11 hemoglobin at that time 10.4.  Her kidney disease was felt to be stable clonidine was decreased to 0.1 mg twice daily. Compliance with diet, lifestyle and medications: Yes  Her weight at home is up somewhere in the range to 8 to 10 pounds she is to take an extra dose of diuretic as needed she stopped she thought she gained body mass.  On exam she has 2+ edema and mildly decompensated heart failure.  Fortunately she is not having trouble being short of breath no chest pain palpitation or syncope.  Recent labs PCP 05/19/2020 creatinine 2.5 cholesterol 112 LDL 58 triglycerides 125 HDL 29 Past Medical History:  Diagnosis Date  . Acute renal failure superimposed on stage 4 chronic kidney disease ([place]) 11/24/2018  . Acute Respiratory failure with hypoxia ([place]) 11/28/2018  . AKI (acute kidney injury) ([place]) 12/21/2018  . Anemia in stage 4 chronic kidney disease ([place]) 12/17/2018  . Asthma   . Asthma, mild persistent 08/06/2011  . Benign hypertension with CKD (chronic kidney disease) stage IV ([place]) 08/06/2011  . CAP (community acquired pneumonia) 11/24/2018  . Chronic diastolic heart failure ([place]) 12/16/2018   Is admitted to [facility]  11/23/2020 12/03/2018 with community-acquired pneumonia.  Her case was complicated by severe anemia hemoglobin 6.9 transfused 8.3 and acute superimposed on stage IV CKD.  In hospital she developed volume overload with her weight rising 3 kg in overt congestive heart failure and required IV diuretics.  Although she was volume overloaded and radiographically was in  . Chronic kidney disease, stage 4 (severe) ([place]) 08/06/2011  . Coronary artery disease involving native coronary artery of  native heart without angina pectoris 07/03/2017   1. ASHD (arteriosclerotic heart disease)  2. Old anteroseptal myocardial infarction  3. Resolute DES to LAD 04/2013   Review of records from care everywhere [facility] regional cardiology in [place] show a history of known CAD previous anterior septal myocardial infarction and PCI and stent LAD drug-eluting in September 2014.  . Diabetes ([place])   . Dyspnea on exertion 08/04/2014   History of coronary disease/MI/stent PFT 11/07/2014-moderate diffusion defect 60% predicted with normal lung volumes and spirometry flows.   . Elevated troponin 11/24/2018   Low level 0.8 with pneumonia anemia and severe CKD  . GERD (gastroesophageal reflux disease) 10/07/2014  . Heart attack ([place]) 2014  . History of gastroesophageal reflux (GERD) 08/06/2011  . Hyperlipidemia   . Hyperphosphatemia 08/22/2016  . Hypertension   . Hypertensive heart and kidney disease with chronic diastolic congestive heart failure and stage 4 chronic kidney disease ([place]) 11/24/2018  . Hypokalemia 02/17/2019  . Hypomagnesemia 01/02/2012  . Hyponatremia 11/24/2018  . Insomnia 08/04/2014  . Insulin-requiring or dependent type II diabetes mellitus ([place]) 08/06/2011  . Iron deficiency anemia 06/18/2018  . Kidney disease   . Metabolic bone disease 0/53/9767  . Microcytic anemia 01/02/2012  . Nausea vomiting and diarrhea 11/24/2018  . Obstructive sleep apnea syndrome 08/06/2011   NPSG 12/05/10([place]) AHI 14.2/ hr, weight 178 lbs.  . Old MI (myocardial infarction) 03/12/2015   Stent  . Other specified disorders of adrenal gland ([place]) 09/24/2011  . Pelvic mass 12/20/2016   Overview:  Added automatically from request for surgery [id number]  Formatting of this note might be different from the original. Added automatically from request for surgery [id number]  . Renal artery stenosis ([place]) 12/03/2018   She had a duplex in September 2019, report is not in Epic but described in office note November 2019: "She does have  probably atherosclerotic occlusion of the left renal artery with a small atrophic left kidney canal right renal artery has a proximal stenosis there is clearly hemodynamically significant. With her last 2 renal duplex study showing velocities of 340 and then a little bit less than 2  . Sleep apnea    Using CPAP  . Vitamin D deficiency 01/22/2013    Past Surgical History:  Procedure Laterality Date  . ABDOMINAL HYSTERECTOMY    . CAROTID STENT    . CHOLECYSTECTOMY    . TUBAL LIGATION    . 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 VESICOVAGINAL FISTULA CLOSURE W/ TAH      Current Medications: Current Meds  Medication Sig  . albuterol (VENTOLIN HFA) 108 (90 Base) MCG/ACT inhaler Inhale 2 puffs into the lungs every 6 (six) hours as needed for wheezing or shortness of breath.  . allopurinol (ZYLOPRIM) 100 MG tablet Take 100 mg by mouth daily.  [name] aspirin 81 MG tablet Take 81 mg by mouth at bedtime.   [name] atorvastatin (LIPITOR) 80 MG tablet TAKE 1 TABLET (80 MG TOTAL) BY MOUTH AT BEDTIME.  [name] B-D UF III MINI PEN NEEDLES  31G X 5 MM MISC   . budesonide-formoterol (SYMBICORT) 80-4.5 MCG/ACT inhaler SMARTSIG:2 Puff(s) By Mouth Twice Daily  . Cholecalciferol (VITAMIN D) 125 MCG (5000 UT) CAPS Take 1 capsule by mouth daily.   . cloNIDine (CATAPRES) 0.1 MG tablet Take 0.1 mg by mouth 2 (two) times daily.   . Cobalamin Combinations (B12 FOLATE PO) Take 800 mcg by mouth daily.  . Continuous Blood Gluc Sensor (FREESTYLE LIBRE 14 DAY SENSOR) MISC USE every 14 DAYS as directed  . ezetimibe (ZETIA) 10 MG tablet TAKE 1 TABLET (10 MG TOTAL) BY MOUTH DAILY.  [name] FEROSUL 325 (65 Fe) MG tablet Take 325 mg by mouth 2 (two) times daily.  . hydrALAZINE (APRESOLINE) 25 MG tablet Take 25 mg by mouth 3 (three) times daily. Take 3 tablets ( 75 mg ) three times a day  . Insulin Degludec (TRESIBA FLEXTOUCH [place]) Inject 50 Units into the skin daily.  . insulin lispro (HUMALOG) 100 UNIT/ML injection Inject into the skin.  [name]  isosorbide mononitrate (IMDUR) 120 MG 24 hr tablet TAKE 1 TABLET (120 MG TOTAL) BY MOUTH DAILY  . labetalol (NORMODYNE) 100 MG tablet Take 50 mg by mouth in the morning and at bedtime.  . montelukast (SINGULAIR) 10 MG tablet Take 1 tablet (10 mg total) by mouth daily.  . Multiple Vitamin (MULTIVITAMIN) tablet Take 1 tablet by mouth daily.  . Multiple Vitamins-Minerals (PRESERVISION AREDS 2 PO) Take 1 capsule by mouth 2 (two) times daily.   . nitroGLYCERIN (NITROSTAT) 0.4 MG SL tablet PLACE 1 TABLET (0.4 MG TOTAL) UNDER THE TONGUE EVERY 5 (FIVE) MINUTES AS NEEDED FOR CHEST PAIN.  [name] potassium chloride (KLOR-CON) 10 MEQ tablet Take 10 mEq by mouth every morning.  . sertraline (ZOLOFT) 25 MG tablet Take 25 mg by mouth daily.  [name] trimethoprim-polymyxin b (POLYTRIM) ophthalmic solution instill ONE DROP IN THE RIGHT EYE FOUR TIMES DAILY FOR 2 DAYS AFTER each monthly eye injection  . vitamin B-12 (CYANOCOBALAMIN) 1000 MCG tablet Take 1,000 mcg by mouth every morning.  . [DISCONTINUED] carvedilol (COREG) 25 MG tablet TAKE 1 TABLET (25 MG TOTAL) BY MOUTH 2 (TWO) TIMES DAILY WITH A MEAL.  . [DISCONTINUED] furosemide (LASIX) 40 MG tablet Take 40 mg by mouth daily.     Allergies:   Compazine [prochlorperazine edisylate], Colcrys [colchicine], Hydrocodone, and Codeine   Social History   Socioeconomic History  . Marital status: Married    Spouse name: Not on file  . Number of children: Not on file  . Years of education: Not on file  . Highest education level: Not on file  Occupational History  . Occupation: retired  Tobacco Use  . Smoking status: Never Smoker  . Smokeless tobacco: Never Used  Vaping Use  . Vaping Use: Never used  Substance and Sexual Activity  . Alcohol use: No    Alcohol/week: 0.0 standard drinks  . Drug use: No  . Sexual activity: Not on file  Other Topics Concern  . Not on file  Social History Narrative  . Not on file   Social Determinants of Health   Financial Resource  Strain:   . Difficulty of Paying Living Expenses: Not on file  Food Insecurity:   . Worried About Charity fundraiser in the Last Year: Not on file  . Ran Out of Food in the Last Year: Not on file  Transportation Needs:   . Lack of Transportation (Medical): Not on file  . Lack of Transportation (Non-Medical): Not on file  Physical Activity:   . Days of Exercise per Week: Not on file  . Minutes of Exercise per Session: Not on file  Stress:   . Feeling of Stress : Not on file  Social Connections:   . Frequency of Communication with Friends and Family: Not on file  . Frequency of Social Gatherings with Friends and Family: Not on file  . Attends Religious Services: Not on file  . Active Member of Clubs or Organizations: Not on file  . Attends Archivist Meetings: Not on file  . Marital Status: Not on file     Family History: The patient's family history includes Diabetes in her sister and sister; Heart disease in her father, maternal grandfather, maternal grandmother, mother, paternal grandfather, and paternal grandmother; Lung cancer in her brother. ROS:   Please see the history of present illness.    All other systems reviewed and are negative.  EKGs/Labs/Other Studies Reviewed:    The following studies were reviewed today:    Recent Labs: 02/10/2020: B Natriuretic Peptide 288.7; BUN 64; Creatinine, Ser 3.06; Hemoglobin 8.9; Platelets 207; Potassium 4.1; Sodium 139  Recent Lipid Panel No results found for: CHOL, TRIG, HDL, CHOLHDL, VLDL, LDLCALC, LDLDIRECT  Physical Exam:    VS:  BP (!) 218/76   Pulse 78   Ht 5\' 8"  (1.727 m)   Wt 168 lb (76.2 kg)   SpO2 96%   BMI 25.54 kg/m     Wt Readings from Last 3 Encounters:  07/11/20 168 lb (76.2 kg)  06/20/20 166 lb 3.2 oz (75.4 kg)  02/10/20 174 lb (78.9 kg)    Repeat blood pressure 158/80 left upper extremity sitting normal cuff GEN:  Well nourished, well developed in no acute distress HEENT: Normal NECK: No  JVD; No carotid bruits LYMPHATICS: No lymphadenopathy CARDIAC: RRR, no murmurs, rubs, gallops RESPIRATORY:  Clear to auscultation without rales, wheezing or rhonchi  ABDOMEN: Soft, non-tender, non-distended MUSCULOSKELETAL: She has bilateral 2+ lower extremity pitting edema; No deformity  SKIN: Warm and dry NEUROLOGIC:  Alert and oriented x 3 PSYCHIATRIC:  Normal affect    Signed, Shirlee More, MD  07/11/2020 2:59 PM    [facility]

## 2020-07-11 ENCOUNTER — Ambulatory Visit: Payer: PPO | Admitting: Cardiology

## 2020-07-11 ENCOUNTER — Encounter: Payer: Self-pay | Admitting: Cardiology

## 2020-07-11 VITALS — BP 218/76 | HR 78 | Ht 68.0 in | Wt 168.0 lb

## 2020-07-11 DIAGNOSIS — E785 Hyperlipidemia, unspecified: Secondary | ICD-10-CM | POA: Diagnosis not present

## 2020-07-11 DIAGNOSIS — I2510 Atherosclerotic heart disease of native coronary artery without angina pectoris: Secondary | ICD-10-CM | POA: Diagnosis not present

## 2020-07-11 DIAGNOSIS — I130 Hypertensive heart and chronic kidney disease with heart failure and stage 1 through stage 4 chronic kidney disease, or unspecified chronic kidney disease: Secondary | ICD-10-CM

## 2020-07-11 DIAGNOSIS — I5032 Chronic diastolic (congestive) heart failure: Secondary | ICD-10-CM | POA: Diagnosis not present

## 2020-07-11 DIAGNOSIS — N184 Chronic kidney disease, stage 4 (severe): Secondary | ICD-10-CM

## 2020-07-11 DIAGNOSIS — D631 Anemia in chronic kidney disease: Secondary | ICD-10-CM | POA: Diagnosis not present

## 2020-07-11 MED ORDER — FUROSEMIDE 40 MG PO TABS
40.0000 mg | ORAL_TABLET | Freq: Two times a day (BID) | ORAL | 3 refills | Status: DC
Start: 2020-07-11 — End: 2023-08-11

## 2020-07-11 NOTE — Patient Instructions (Signed)
Medication Instructions:  Your physician has recommended you make the following change in your medication:  START: Furosemide 40 mg take one tablet by mouth daily and one tablet by mouth in the afternoon on Tuesday and Thursday.  *If you need a refill on your cardiac medications before your next appointment, please call your pharmacy*   Lab Work: None If you have labs (blood work) drawn today and your tests are completely normal, you will receive your results only by: [name] MyChart Message (if you have MyChart) OR . A paper copy in the mail If you have any lab test that is abnormal or we need to change your treatment, we will call you to review the results.   Testing/Procedures: None   Follow-Up: At [facility], you and your health needs are our priority.  As part of our continuing mission to provide you with exceptional heart care, we have created designated Provider Care Teams.  These Care Teams include your primary Cardiologist (physician) and Advanced Practice Providers (APPs -  Physician Assistants and Nurse Practitioners) who all work together to provide you with the care you need, when you need it.  We recommend signing up for the patient portal called "MyChart".  Sign up information is provided on this After Visit Summary.  MyChart is used to connect with patients for Virtual Visits (Telemedicine).  Patients are able to view lab/test results, encounter notes, upcoming appointments, etc.  Non-urgent messages can be sent to your provider as well.   To learn more about what you can do with MyChart, go to [email or web address].    Your next appointment:   6 month(s)  The format for your next appointment:   In Person  Provider:   Shirlee More, MD   Other Instructions

## 2020-07-13 DIAGNOSIS — N179 Acute kidney failure, unspecified: Secondary | ICD-10-CM | POA: Diagnosis not present

## 2020-07-17 DIAGNOSIS — E559 Vitamin D deficiency, unspecified: Secondary | ICD-10-CM | POA: Diagnosis not present

## 2020-07-17 DIAGNOSIS — M9080 Osteopathy in diseases classified elsewhere, unspecified site: Secondary | ICD-10-CM | POA: Diagnosis not present

## 2020-07-17 DIAGNOSIS — E889 Metabolic disorder, unspecified: Secondary | ICD-10-CM | POA: Diagnosis not present

## 2020-07-17 DIAGNOSIS — N184 Chronic kidney disease, stage 4 (severe): Secondary | ICD-10-CM | POA: Diagnosis not present

## 2020-07-17 DIAGNOSIS — Z794 Long term (current) use of insulin: Secondary | ICD-10-CM | POA: Diagnosis not present

## 2020-07-17 DIAGNOSIS — E1122 Type 2 diabetes mellitus with diabetic chronic kidney disease: Secondary | ICD-10-CM | POA: Diagnosis not present

## 2020-07-17 DIAGNOSIS — I129 Hypertensive chronic kidney disease with stage 1 through stage 4 chronic kidney disease, or unspecified chronic kidney disease: Secondary | ICD-10-CM | POA: Diagnosis not present

## 2020-07-17 DIAGNOSIS — D631 Anemia in chronic kidney disease: Secondary | ICD-10-CM | POA: Diagnosis not present

## 2020-07-17 DIAGNOSIS — R801 Persistent proteinuria, unspecified: Secondary | ICD-10-CM | POA: Diagnosis not present

## 2020-07-18 ENCOUNTER — Encounter: Payer: Self-pay | Admitting: Sports Medicine

## 2020-07-18 ENCOUNTER — Ambulatory Visit: Payer: PPO | Admitting: Sports Medicine

## 2020-07-18 ENCOUNTER — Other Ambulatory Visit: Payer: Self-pay

## 2020-07-18 DIAGNOSIS — M2140 Flat foot [pes planus] (acquired), unspecified foot: Secondary | ICD-10-CM | POA: Diagnosis not present

## 2020-07-18 DIAGNOSIS — I739 Peripheral vascular disease, unspecified: Secondary | ICD-10-CM

## 2020-07-18 DIAGNOSIS — G4733 Obstructive sleep apnea (adult) (pediatric): Secondary | ICD-10-CM | POA: Diagnosis not present

## 2020-07-18 DIAGNOSIS — M21619 Bunion of unspecified foot: Secondary | ICD-10-CM

## 2020-07-18 DIAGNOSIS — E1140 Type 2 diabetes mellitus with diabetic neuropathy, unspecified: Secondary | ICD-10-CM | POA: Diagnosis not present

## 2020-07-18 DIAGNOSIS — L97511 Non-pressure chronic ulcer of other part of right foot limited to breakdown of skin: Secondary | ICD-10-CM

## 2020-07-18 NOTE — Progress Notes (Signed)
Subjective: Leah Walsh is a 79 y.o. female patient seen in office for follow-up evaluation of ulceration of the right great toe. Reports that she is doing good without any issues.  Has not noticed any redness swelling warmth or drainage from the right great toe.  Has been constantly wrapping with dry dressing without any issues.  No other pedal complaints noted.  Patient Active Problem List   Diagnosis Date Noted  . Sleep apnea   . Kidney disease   . Hypertension   . Hyperlipidemia   . Diabetes ([place])   . Asthma   . Hypokalemia 02/17/2019  . AKI (acute kidney injury) ([place]) 12/21/2018  . Anemia in stage 4 chronic kidney disease ([place]) 12/17/2018  . Chronic diastolic heart failure ([place]) 12/16/2018  . Renal artery stenosis ([place]) 12/03/2018  . Acute Respiratory failure with hypoxia ([place]) 11/28/2018  . Acute renal failure superimposed on stage 4 chronic kidney disease ([place]) 11/24/2018  . CAP (community acquired pneumonia) 11/24/2018  . Hypertensive heart and kidney disease with chronic diastolic congestive heart failure and stage 4 chronic kidney disease ([place]) 11/24/2018  . Hyponatremia 11/24/2018  . Nausea vomiting and diarrhea 11/24/2018  . Elevated troponin 11/24/2018  . Iron deficiency anemia 06/18/2018  . Coronary artery disease involving native coronary artery of native heart without angina pectoris 07/03/2017  . Pelvic mass 12/20/2016  . Hyperphosphatemia 08/22/2016  . Metabolic bone disease 17/61/6073  . Old MI (myocardial infarction) 03/12/2015  . GERD (gastroesophageal reflux disease) 10/07/2014  . Insomnia 08/04/2014  . Dyspnea on exertion 08/04/2014  . Vitamin D deficiency 01/22/2013  . Heart attack ([place]) 2014  . Microcytic anemia 01/02/2012  . Hypomagnesemia 01/02/2012  . Other specified disorders of adrenal gland ([place]) 09/24/2011  . Obstructive sleep apnea syndrome 08/06/2011  . Asthma, mild persistent 08/06/2011  . Insulin-requiring or dependent type II diabetes  mellitus ([place]) 08/06/2011  . Chronic kidney disease, stage 4 (severe) ([place]) 08/06/2011  . History of gastroesophageal reflux (GERD) 08/06/2011  . Benign hypertension with CKD (chronic kidney disease) stage IV ([place]) 08/06/2011   Current Outpatient Medications on File Prior to Visit  Medication Sig Dispense Refill  . albuterol (VENTOLIN HFA) 108 (90 Base) MCG/ACT inhaler Inhale 2 puffs into the lungs every 6 (six) hours as needed for wheezing or shortness of breath.    . allopurinol (ZYLOPRIM) 100 MG tablet Take 100 mg by mouth daily.  3  . aspirin 81 MG tablet Take 81 mg by mouth at bedtime.     [name] atorvastatin (LIPITOR) 80 MG tablet TAKE 1 TABLET (80 MG TOTAL) BY MOUTH AT BEDTIME. 90 tablet 1  . B-D UF III MINI PEN NEEDLES 31G X 5 MM MISC   6  . budesonide-formoterol (SYMBICORT) 80-4.5 MCG/ACT inhaler SMARTSIG:2 Puff(s) By Mouth Twice Daily    . Cholecalciferol (VITAMIN D) 125 MCG (5000 UT) CAPS Take 1 capsule by mouth daily.     . cloNIDine (CATAPRES) 0.1 MG tablet Take 0.1 mg by mouth 2 (two) times daily.     . Cobalamin Combinations (B12 FOLATE PO) Take 800 mcg by mouth daily.    . Continuous Blood Gluc Sensor (FREESTYLE LIBRE 14 DAY SENSOR) MISC USE every 14 DAYS as directed    . ezetimibe (ZETIA) 10 MG tablet TAKE 1 TABLET (10 MG TOTAL) BY MOUTH DAILY. 90 tablet 1  . FEROSUL 325 (65 Fe) MG tablet Take 325 mg by mouth 2 (two) times daily.    . furosemide (LASIX) 40 MG tablet Take 1  tablet (40 mg total) by mouth 2 (two) times daily. 180 tablet 3  . hydrALAZINE (APRESOLINE) 25 MG tablet Take 25 mg by mouth 3 (three) times daily. Take 3 tablets ( 75 mg ) three times a day    . Insulin Degludec (TRESIBA FLEXTOUCH [place]) Inject 50 Units into the skin daily.    . insulin lispro (HUMALOG) 100 UNIT/ML injection Inject into the skin.    [name] isosorbide mononitrate (IMDUR) 120 MG 24 hr tablet TAKE 1 TABLET (120 MG TOTAL) BY MOUTH DAILY 90 tablet 1  . labetalol (NORMODYNE) 100 MG tablet Take 50 mg by mouth  in the morning and at bedtime.    . montelukast (SINGULAIR) 10 MG tablet Take 1 tablet (10 mg total) by mouth daily. 30 tablet 5  . Multiple Vitamin (MULTIVITAMIN) tablet Take 1 tablet by mouth daily.    . Multiple Vitamins-Minerals (PRESERVISION AREDS 2 PO) Take 1 capsule by mouth 2 (two) times daily.     . nitroGLYCERIN (NITROSTAT) 0.4 MG SL tablet PLACE 1 TABLET (0.4 MG TOTAL) UNDER THE TONGUE EVERY 5 (FIVE) MINUTES AS NEEDED FOR CHEST PAIN. 25 tablet 3  . potassium chloride (KLOR-CON) 10 MEQ tablet Take 10 mEq by mouth every morning.    . sertraline (ZOLOFT) 25 MG tablet Take 25 mg by mouth daily.    [name] trimethoprim-polymyxin b (POLYTRIM) ophthalmic solution instill ONE DROP IN THE RIGHT EYE FOUR TIMES DAILY FOR 2 DAYS AFTER each monthly eye injection    . vitamin B-12 (CYANOCOBALAMIN) 1000 MCG tablet Take 1,000 mcg by mouth every morning.     No current facility-administered medications on file prior to visit.   Allergies  Allergen Reactions  . Compazine [Prochlorperazine Edisylate] Other (See Comments)    Stroke like symptoms  . Colcrys [Colchicine] Diarrhea  . Hydrocodone   . Codeine Nausea And Vomiting    Recent Results (from the past 2160 hour(s))  WOUND CULTURE     Status: None   Collection Time: 06/13/20  2:03 PM   Specimen: Foot, Right; Wound   Wound Culture and sens  Result Value Ref Range   Gram Stain Result Final report    Organism ID, Bacteria Comment     Comment: No white blood cells seen.   Organism ID, Bacteria No organisms seen    Aerobic Bacterial Culture Final report    Organism ID, Bacteria Comment     Comment: No growth in 36 - 48 hours.    Objective: There were no vitals filed for this visit.  General: Patient is awake, alert, oriented x 3 and in no acute distress.  Dermatology: Skin is warm and dry bilateral with a dry blood at callus at right great toe with no underlying opening previous ulcer appears to be well-healed.  There is no malodor, no  active drainage, blanchable erythema, mild edema. No acute signs of infection.  Short thickened bilateral hallux toenails likely consistent with onychomycosis versus traumatic nail changes secondary to foot type and bunions   Vascular: Dorsalis Pedis pulse = 1/4 Bilateral,  Posterior Tibial pulse = 0/4 Bilateral,  Capillary Fill Time < 5 seconds, 1+ pitting edema bilateral  Neurologic: Protective sensation severely diminished bilateral with Weinstein monofilament.  Musculosketal:  Minimal pain with palpation to right great toe. Significant bunion hammertoe and pes planus deformity bilateral right greater than left.  No results for input(s): GRAMSTAIN, LABORGA in the last 8760 hours.  Assessment and Plan:  Problem List Items Addressed This Visit  Endocrine   Diabetes ([place])    Other Visit Diagnoses    Skin ulcer of toe of right foot, limited to breakdown of skin ([place])    -  Primary   Healed   PAD (peripheral artery disease) ([place])       Bunion       Pes planus, unspecified laterality         -Examined patient and discussed the progression of the wound and treatment alternatives. - Excisionally dedbrided previous ulceration using chisel blade at right great toe; wound is healed; advised patient to continue with dry dressing in shoes that do not rub great toe -Patient to meet with Liliane Channel to be measured for custom molded orthotics to alleviate pressure at first toe on the right to prevent reulceration - Advised patient to go to the ER or return to office if the wound worsens or if constitutional symptoms are present. -Patient to return to office for orthotics or sooner problems or issues arise. Landis Martins, DPM

## 2020-07-25 DIAGNOSIS — J9811 Atelectasis: Secondary | ICD-10-CM | POA: Diagnosis not present

## 2020-07-25 DIAGNOSIS — N189 Chronic kidney disease, unspecified: Secondary | ICD-10-CM | POA: Diagnosis not present

## 2020-07-25 DIAGNOSIS — E1122 Type 2 diabetes mellitus with diabetic chronic kidney disease: Secondary | ICD-10-CM | POA: Diagnosis not present

## 2020-07-25 DIAGNOSIS — R0600 Dyspnea, unspecified: Secondary | ICD-10-CM | POA: Diagnosis not present

## 2020-07-25 DIAGNOSIS — D631 Anemia in chronic kidney disease: Secondary | ICD-10-CM | POA: Diagnosis not present

## 2020-07-25 DIAGNOSIS — Z20828 Contact with and (suspected) exposure to other viral communicable diseases: Secondary | ICD-10-CM | POA: Diagnosis not present

## 2020-07-25 DIAGNOSIS — I129 Hypertensive chronic kidney disease with stage 1 through stage 4 chronic kidney disease, or unspecified chronic kidney disease: Secondary | ICD-10-CM | POA: Diagnosis not present

## 2020-07-25 DIAGNOSIS — I509 Heart failure, unspecified: Secondary | ICD-10-CM | POA: Diagnosis not present

## 2020-07-25 DIAGNOSIS — R0602 Shortness of breath: Secondary | ICD-10-CM | POA: Diagnosis not present

## 2020-07-25 DIAGNOSIS — J189 Pneumonia, unspecified organism: Secondary | ICD-10-CM | POA: Diagnosis not present

## 2020-07-25 DIAGNOSIS — J918 Pleural effusion in other conditions classified elsewhere: Secondary | ICD-10-CM | POA: Diagnosis not present

## 2020-07-25 DIAGNOSIS — R0981 Nasal congestion: Secondary | ICD-10-CM | POA: Diagnosis not present

## 2020-07-25 DIAGNOSIS — N183 Chronic kidney disease, stage 3 unspecified: Secondary | ICD-10-CM | POA: Diagnosis not present

## 2020-07-25 DIAGNOSIS — J90 Pleural effusion, not elsewhere classified: Secondary | ICD-10-CM | POA: Diagnosis not present

## 2020-07-25 DIAGNOSIS — R051 Acute cough: Secondary | ICD-10-CM | POA: Diagnosis not present

## 2020-07-28 DIAGNOSIS — Z6826 Body mass index (BMI) 26.0-26.9, adult: Secondary | ICD-10-CM | POA: Diagnosis not present

## 2020-07-28 DIAGNOSIS — J189 Pneumonia, unspecified organism: Secondary | ICD-10-CM | POA: Diagnosis not present

## 2020-08-04 DIAGNOSIS — Z6826 Body mass index (BMI) 26.0-26.9, adult: Secondary | ICD-10-CM | POA: Diagnosis not present

## 2020-08-04 DIAGNOSIS — J189 Pneumonia, unspecified organism: Secondary | ICD-10-CM | POA: Diagnosis not present

## 2020-08-04 DIAGNOSIS — Z1331 Encounter for screening for depression: Secondary | ICD-10-CM | POA: Diagnosis not present

## 2020-08-04 DIAGNOSIS — Z9181 History of falling: Secondary | ICD-10-CM | POA: Diagnosis not present

## 2020-08-07 ENCOUNTER — Encounter (INDEPENDENT_AMBULATORY_CARE_PROVIDER_SITE_OTHER): Payer: PPO | Admitting: Ophthalmology

## 2020-08-07 ENCOUNTER — Other Ambulatory Visit: Payer: Self-pay

## 2020-08-07 DIAGNOSIS — H34831 Tributary (branch) retinal vein occlusion, right eye, with macular edema: Secondary | ICD-10-CM | POA: Diagnosis not present

## 2020-08-07 DIAGNOSIS — H353132 Nonexudative age-related macular degeneration, bilateral, intermediate dry stage: Secondary | ICD-10-CM

## 2020-08-07 DIAGNOSIS — I10 Essential (primary) hypertension: Secondary | ICD-10-CM | POA: Diagnosis not present

## 2020-08-07 DIAGNOSIS — H35033 Hypertensive retinopathy, bilateral: Secondary | ICD-10-CM

## 2020-08-07 DIAGNOSIS — H43813 Vitreous degeneration, bilateral: Secondary | ICD-10-CM

## 2020-08-10 ENCOUNTER — Ambulatory Visit (INDEPENDENT_AMBULATORY_CARE_PROVIDER_SITE_OTHER): Payer: PPO | Admitting: Orthotics

## 2020-08-10 ENCOUNTER — Other Ambulatory Visit: Payer: Self-pay

## 2020-08-10 DIAGNOSIS — E1140 Type 2 diabetes mellitus with diabetic neuropathy, unspecified: Secondary | ICD-10-CM

## 2020-08-17 DIAGNOSIS — G4733 Obstructive sleep apnea (adult) (pediatric): Secondary | ICD-10-CM | POA: Diagnosis not present

## 2020-08-24 DIAGNOSIS — E1122 Type 2 diabetes mellitus with diabetic chronic kidney disease: Secondary | ICD-10-CM | POA: Diagnosis not present

## 2020-08-24 DIAGNOSIS — I129 Hypertensive chronic kidney disease with stage 1 through stage 4 chronic kidney disease, or unspecified chronic kidney disease: Secondary | ICD-10-CM | POA: Diagnosis not present

## 2020-08-24 DIAGNOSIS — N183 Chronic kidney disease, stage 3 unspecified: Secondary | ICD-10-CM | POA: Diagnosis not present

## 2020-09-17 DIAGNOSIS — G4733 Obstructive sleep apnea (adult) (pediatric): Secondary | ICD-10-CM | POA: Diagnosis not present

## 2020-09-18 ENCOUNTER — Encounter (INDEPENDENT_AMBULATORY_CARE_PROVIDER_SITE_OTHER): Payer: PPO | Admitting: Ophthalmology

## 2020-09-18 ENCOUNTER — Other Ambulatory Visit: Payer: Self-pay

## 2020-09-18 DIAGNOSIS — H35033 Hypertensive retinopathy, bilateral: Secondary | ICD-10-CM

## 2020-09-18 DIAGNOSIS — I10 Essential (primary) hypertension: Secondary | ICD-10-CM

## 2020-09-18 DIAGNOSIS — H43813 Vitreous degeneration, bilateral: Secondary | ICD-10-CM | POA: Diagnosis not present

## 2020-09-18 DIAGNOSIS — H34831 Tributary (branch) retinal vein occlusion, right eye, with macular edema: Secondary | ICD-10-CM

## 2020-09-18 DIAGNOSIS — H353132 Nonexudative age-related macular degeneration, bilateral, intermediate dry stage: Secondary | ICD-10-CM | POA: Diagnosis not present

## 2020-09-24 DIAGNOSIS — E1122 Type 2 diabetes mellitus with diabetic chronic kidney disease: Secondary | ICD-10-CM | POA: Diagnosis not present

## 2020-09-24 DIAGNOSIS — N183 Chronic kidney disease, stage 3 unspecified: Secondary | ICD-10-CM | POA: Diagnosis not present

## 2020-09-24 DIAGNOSIS — I129 Hypertensive chronic kidney disease with stage 1 through stage 4 chronic kidney disease, or unspecified chronic kidney disease: Secondary | ICD-10-CM | POA: Diagnosis not present

## 2020-09-28 DIAGNOSIS — R059 Cough, unspecified: Secondary | ICD-10-CM | POA: Diagnosis not present

## 2020-09-28 DIAGNOSIS — Z794 Long term (current) use of insulin: Secondary | ICD-10-CM | POA: Diagnosis not present

## 2020-09-28 DIAGNOSIS — I509 Heart failure, unspecified: Secondary | ICD-10-CM | POA: Diagnosis not present

## 2020-09-28 DIAGNOSIS — N184 Chronic kidney disease, stage 4 (severe): Secondary | ICD-10-CM | POA: Diagnosis not present

## 2020-09-28 DIAGNOSIS — E7800 Pure hypercholesterolemia, unspecified: Secondary | ICD-10-CM | POA: Diagnosis not present

## 2020-09-28 DIAGNOSIS — I701 Atherosclerosis of renal artery: Secondary | ICD-10-CM | POA: Diagnosis not present

## 2020-09-28 DIAGNOSIS — I2510 Atherosclerotic heart disease of native coronary artery without angina pectoris: Secondary | ICD-10-CM | POA: Diagnosis not present

## 2020-09-28 DIAGNOSIS — R062 Wheezing: Secondary | ICD-10-CM | POA: Diagnosis not present

## 2020-09-28 DIAGNOSIS — D631 Anemia in chronic kidney disease: Secondary | ICD-10-CM | POA: Diagnosis not present

## 2020-09-28 DIAGNOSIS — I130 Hypertensive heart and chronic kidney disease with heart failure and stage 1 through stage 4 chronic kidney disease, or unspecified chronic kidney disease: Secondary | ICD-10-CM | POA: Diagnosis not present

## 2020-09-28 DIAGNOSIS — J90 Pleural effusion, not elsewhere classified: Secondary | ICD-10-CM | POA: Diagnosis not present

## 2020-09-28 DIAGNOSIS — I340 Nonrheumatic mitral (valve) insufficiency: Secondary | ICD-10-CM | POA: Diagnosis not present

## 2020-09-28 DIAGNOSIS — J811 Chronic pulmonary edema: Secondary | ICD-10-CM | POA: Diagnosis not present

## 2020-09-28 DIAGNOSIS — K219 Gastro-esophageal reflux disease without esophagitis: Secondary | ICD-10-CM | POA: Diagnosis not present

## 2020-09-28 DIAGNOSIS — Z79899 Other long term (current) drug therapy: Secondary | ICD-10-CM | POA: Diagnosis not present

## 2020-09-28 DIAGNOSIS — I252 Old myocardial infarction: Secondary | ICD-10-CM | POA: Diagnosis not present

## 2020-09-28 DIAGNOSIS — R0602 Shortness of breath: Secondary | ICD-10-CM | POA: Diagnosis not present

## 2020-09-28 DIAGNOSIS — Z8701 Personal history of pneumonia (recurrent): Secondary | ICD-10-CM | POA: Diagnosis not present

## 2020-09-28 DIAGNOSIS — I110 Hypertensive heart disease with heart failure: Secondary | ICD-10-CM | POA: Diagnosis not present

## 2020-09-28 DIAGNOSIS — N179 Acute kidney failure, unspecified: Secondary | ICD-10-CM | POA: Diagnosis not present

## 2020-09-28 DIAGNOSIS — F419 Anxiety disorder, unspecified: Secondary | ICD-10-CM | POA: Diagnosis not present

## 2020-09-28 DIAGNOSIS — J4530 Mild persistent asthma, uncomplicated: Secondary | ICD-10-CM | POA: Diagnosis not present

## 2020-09-28 DIAGNOSIS — N289 Disorder of kidney and ureter, unspecified: Secondary | ICD-10-CM | POA: Diagnosis not present

## 2020-09-28 DIAGNOSIS — I160 Hypertensive urgency: Secondary | ICD-10-CM | POA: Diagnosis not present

## 2020-09-28 DIAGNOSIS — I5033 Acute on chronic diastolic (congestive) heart failure: Secondary | ICD-10-CM | POA: Diagnosis not present

## 2020-09-28 DIAGNOSIS — D649 Anemia, unspecified: Secondary | ICD-10-CM | POA: Diagnosis not present

## 2020-09-28 DIAGNOSIS — E1122 Type 2 diabetes mellitus with diabetic chronic kidney disease: Secondary | ICD-10-CM | POA: Diagnosis not present

## 2020-09-28 DIAGNOSIS — Z955 Presence of coronary angioplasty implant and graft: Secondary | ICD-10-CM | POA: Diagnosis not present

## 2020-09-29 DIAGNOSIS — N184 Chronic kidney disease, stage 4 (severe): Secondary | ICD-10-CM | POA: Diagnosis not present

## 2020-09-29 DIAGNOSIS — I509 Heart failure, unspecified: Secondary | ICD-10-CM | POA: Diagnosis not present

## 2020-09-29 DIAGNOSIS — I160 Hypertensive urgency: Secondary | ICD-10-CM | POA: Diagnosis not present

## 2020-09-29 DIAGNOSIS — I130 Hypertensive heart and chronic kidney disease with heart failure and stage 1 through stage 4 chronic kidney disease, or unspecified chronic kidney disease: Secondary | ICD-10-CM | POA: Diagnosis not present

## 2020-09-29 DIAGNOSIS — I5033 Acute on chronic diastolic (congestive) heart failure: Secondary | ICD-10-CM | POA: Diagnosis not present

## 2020-09-29 DIAGNOSIS — N289 Disorder of kidney and ureter, unspecified: Secondary | ICD-10-CM | POA: Diagnosis not present

## 2020-09-29 DIAGNOSIS — D649 Anemia, unspecified: Secondary | ICD-10-CM | POA: Diagnosis not present

## 2020-09-30 DIAGNOSIS — I2510 Atherosclerotic heart disease of native coronary artery without angina pectoris: Secondary | ICD-10-CM

## 2020-09-30 DIAGNOSIS — D649 Anemia, unspecified: Secondary | ICD-10-CM | POA: Diagnosis not present

## 2020-09-30 DIAGNOSIS — I5033 Acute on chronic diastolic (congestive) heart failure: Secondary | ICD-10-CM | POA: Diagnosis not present

## 2020-09-30 DIAGNOSIS — I130 Hypertensive heart and chronic kidney disease with heart failure and stage 1 through stage 4 chronic kidney disease, or unspecified chronic kidney disease: Secondary | ICD-10-CM | POA: Diagnosis not present

## 2020-09-30 DIAGNOSIS — N184 Chronic kidney disease, stage 4 (severe): Secondary | ICD-10-CM | POA: Diagnosis not present

## 2020-09-30 DIAGNOSIS — I340 Nonrheumatic mitral (valve) insufficiency: Secondary | ICD-10-CM | POA: Diagnosis not present

## 2020-10-01 DIAGNOSIS — N184 Chronic kidney disease, stage 4 (severe): Secondary | ICD-10-CM | POA: Diagnosis not present

## 2020-10-01 DIAGNOSIS — I130 Hypertensive heart and chronic kidney disease with heart failure and stage 1 through stage 4 chronic kidney disease, or unspecified chronic kidney disease: Secondary | ICD-10-CM | POA: Diagnosis not present

## 2020-10-01 DIAGNOSIS — D649 Anemia, unspecified: Secondary | ICD-10-CM | POA: Diagnosis not present

## 2020-10-01 DIAGNOSIS — I5033 Acute on chronic diastolic (congestive) heart failure: Secondary | ICD-10-CM | POA: Diagnosis not present

## 2020-10-02 DIAGNOSIS — I5033 Acute on chronic diastolic (congestive) heart failure: Secondary | ICD-10-CM | POA: Diagnosis not present

## 2020-10-02 DIAGNOSIS — D649 Anemia, unspecified: Secondary | ICD-10-CM | POA: Diagnosis not present

## 2020-10-02 DIAGNOSIS — N184 Chronic kidney disease, stage 4 (severe): Secondary | ICD-10-CM | POA: Diagnosis not present

## 2020-10-02 DIAGNOSIS — I130 Hypertensive heart and chronic kidney disease with heart failure and stage 1 through stage 4 chronic kidney disease, or unspecified chronic kidney disease: Secondary | ICD-10-CM | POA: Diagnosis not present

## 2020-10-03 DIAGNOSIS — D649 Anemia, unspecified: Secondary | ICD-10-CM | POA: Diagnosis not present

## 2020-10-03 DIAGNOSIS — N184 Chronic kidney disease, stage 4 (severe): Secondary | ICD-10-CM | POA: Diagnosis not present

## 2020-10-03 DIAGNOSIS — I5033 Acute on chronic diastolic (congestive) heart failure: Secondary | ICD-10-CM | POA: Diagnosis not present

## 2020-10-03 DIAGNOSIS — I130 Hypertensive heart and chronic kidney disease with heart failure and stage 1 through stage 4 chronic kidney disease, or unspecified chronic kidney disease: Secondary | ICD-10-CM | POA: Diagnosis not present

## 2020-10-05 DIAGNOSIS — E1122 Type 2 diabetes mellitus with diabetic chronic kidney disease: Secondary | ICD-10-CM | POA: Diagnosis not present

## 2020-10-05 DIAGNOSIS — N184 Chronic kidney disease, stage 4 (severe): Secondary | ICD-10-CM | POA: Diagnosis not present

## 2020-10-05 DIAGNOSIS — Z794 Long term (current) use of insulin: Secondary | ICD-10-CM | POA: Diagnosis not present

## 2020-10-09 DIAGNOSIS — R801 Persistent proteinuria, unspecified: Secondary | ICD-10-CM | POA: Diagnosis not present

## 2020-10-09 DIAGNOSIS — D631 Anemia in chronic kidney disease: Secondary | ICD-10-CM | POA: Insufficient documentation

## 2020-10-09 DIAGNOSIS — N185 Chronic kidney disease, stage 5: Secondary | ICD-10-CM | POA: Diagnosis not present

## 2020-10-09 DIAGNOSIS — E559 Vitamin D deficiency, unspecified: Secondary | ICD-10-CM | POA: Diagnosis not present

## 2020-10-09 DIAGNOSIS — E889 Metabolic disorder, unspecified: Secondary | ICD-10-CM | POA: Diagnosis not present

## 2020-10-09 DIAGNOSIS — Z794 Long term (current) use of insulin: Secondary | ICD-10-CM | POA: Diagnosis not present

## 2020-10-09 DIAGNOSIS — M9080 Osteopathy in diseases classified elsewhere, unspecified site: Secondary | ICD-10-CM | POA: Diagnosis not present

## 2020-10-09 DIAGNOSIS — I120 Hypertensive chronic kidney disease with stage 5 chronic kidney disease or end stage renal disease: Secondary | ICD-10-CM | POA: Diagnosis not present

## 2020-10-09 DIAGNOSIS — E1122 Type 2 diabetes mellitus with diabetic chronic kidney disease: Secondary | ICD-10-CM | POA: Diagnosis not present

## 2020-10-11 DIAGNOSIS — N186 End stage renal disease: Secondary | ICD-10-CM | POA: Diagnosis not present

## 2020-10-11 DIAGNOSIS — Z6825 Body mass index (BMI) 25.0-25.9, adult: Secondary | ICD-10-CM | POA: Diagnosis not present

## 2020-10-18 DIAGNOSIS — G4733 Obstructive sleep apnea (adult) (pediatric): Secondary | ICD-10-CM | POA: Diagnosis not present

## 2020-10-23 DIAGNOSIS — N183 Chronic kidney disease, stage 3 unspecified: Secondary | ICD-10-CM | POA: Diagnosis not present

## 2020-10-23 DIAGNOSIS — E1122 Type 2 diabetes mellitus with diabetic chronic kidney disease: Secondary | ICD-10-CM | POA: Diagnosis not present

## 2020-10-23 DIAGNOSIS — I129 Hypertensive chronic kidney disease with stage 1 through stage 4 chronic kidney disease, or unspecified chronic kidney disease: Secondary | ICD-10-CM | POA: Diagnosis not present

## 2020-10-25 DIAGNOSIS — I25118 Atherosclerotic heart disease of native coronary artery with other forms of angina pectoris: Secondary | ICD-10-CM | POA: Diagnosis not present

## 2020-10-25 DIAGNOSIS — H353290 Exudative age-related macular degeneration, unspecified eye, stage unspecified: Secondary | ICD-10-CM | POA: Diagnosis not present

## 2020-10-25 DIAGNOSIS — E1122 Type 2 diabetes mellitus with diabetic chronic kidney disease: Secondary | ICD-10-CM | POA: Diagnosis not present

## 2020-10-25 DIAGNOSIS — E46 Unspecified protein-calorie malnutrition: Secondary | ICD-10-CM | POA: Diagnosis not present

## 2020-10-25 DIAGNOSIS — I130 Hypertensive heart and chronic kidney disease with heart failure and stage 1 through stage 4 chronic kidney disease, or unspecified chronic kidney disease: Secondary | ICD-10-CM | POA: Diagnosis not present

## 2020-10-25 DIAGNOSIS — I509 Heart failure, unspecified: Secondary | ICD-10-CM | POA: Diagnosis not present

## 2020-10-25 DIAGNOSIS — K219 Gastro-esophageal reflux disease without esophagitis: Secondary | ICD-10-CM | POA: Diagnosis not present

## 2020-10-25 DIAGNOSIS — J410 Simple chronic bronchitis: Secondary | ICD-10-CM | POA: Diagnosis not present

## 2020-10-25 DIAGNOSIS — I701 Atherosclerosis of renal artery: Secondary | ICD-10-CM | POA: Diagnosis not present

## 2020-10-25 DIAGNOSIS — J45909 Unspecified asthma, uncomplicated: Secondary | ICD-10-CM | POA: Diagnosis not present

## 2020-10-25 DIAGNOSIS — E261 Secondary hyperaldosteronism: Secondary | ICD-10-CM | POA: Diagnosis not present

## 2020-10-25 DIAGNOSIS — F419 Anxiety disorder, unspecified: Secondary | ICD-10-CM | POA: Diagnosis not present

## 2020-10-26 ENCOUNTER — Ambulatory Visit: Payer: PPO | Admitting: Cardiology

## 2020-10-27 ENCOUNTER — Ambulatory Visit: Payer: PPO | Admitting: Cardiology

## 2020-10-30 ENCOUNTER — Other Ambulatory Visit: Payer: Self-pay

## 2020-10-30 ENCOUNTER — Encounter (INDEPENDENT_AMBULATORY_CARE_PROVIDER_SITE_OTHER): Payer: PPO | Admitting: Ophthalmology

## 2020-10-30 DIAGNOSIS — I10 Essential (primary) hypertension: Secondary | ICD-10-CM

## 2020-10-30 DIAGNOSIS — H353132 Nonexudative age-related macular degeneration, bilateral, intermediate dry stage: Secondary | ICD-10-CM | POA: Diagnosis not present

## 2020-10-30 DIAGNOSIS — H34831 Tributary (branch) retinal vein occlusion, right eye, with macular edema: Secondary | ICD-10-CM | POA: Diagnosis not present

## 2020-10-30 DIAGNOSIS — H43813 Vitreous degeneration, bilateral: Secondary | ICD-10-CM

## 2020-10-30 DIAGNOSIS — H35033 Hypertensive retinopathy, bilateral: Secondary | ICD-10-CM

## 2020-11-01 ENCOUNTER — Ambulatory Visit (INDEPENDENT_AMBULATORY_CARE_PROVIDER_SITE_OTHER): Payer: PPO | Admitting: Sports Medicine

## 2020-11-01 ENCOUNTER — Other Ambulatory Visit: Payer: Self-pay

## 2020-11-01 DIAGNOSIS — M2142 Flat foot [pes planus] (acquired), left foot: Secondary | ICD-10-CM | POA: Diagnosis not present

## 2020-11-01 DIAGNOSIS — R55 Syncope and collapse: Secondary | ICD-10-CM | POA: Insufficient documentation

## 2020-11-01 DIAGNOSIS — L97511 Non-pressure chronic ulcer of other part of right foot limited to breakdown of skin: Secondary | ICD-10-CM | POA: Diagnosis not present

## 2020-11-01 DIAGNOSIS — I739 Peripheral vascular disease, unspecified: Secondary | ICD-10-CM

## 2020-11-01 DIAGNOSIS — R079 Chest pain, unspecified: Secondary | ICD-10-CM | POA: Insufficient documentation

## 2020-11-01 DIAGNOSIS — I509 Heart failure, unspecified: Secondary | ICD-10-CM | POA: Insufficient documentation

## 2020-11-01 DIAGNOSIS — E1140 Type 2 diabetes mellitus with diabetic neuropathy, unspecified: Secondary | ICD-10-CM

## 2020-11-01 DIAGNOSIS — I160 Hypertensive urgency: Secondary | ICD-10-CM | POA: Insufficient documentation

## 2020-11-01 DIAGNOSIS — M2140 Flat foot [pes planus] (acquired), unspecified foot: Secondary | ICD-10-CM

## 2020-11-01 DIAGNOSIS — Z20822 Contact with and (suspected) exposure to covid-19: Secondary | ICD-10-CM | POA: Insufficient documentation

## 2020-11-01 DIAGNOSIS — M21619 Bunion of unspecified foot: Secondary | ICD-10-CM

## 2020-11-01 DIAGNOSIS — E041 Nontoxic single thyroid nodule: Secondary | ICD-10-CM | POA: Insufficient documentation

## 2020-11-01 NOTE — Progress Notes (Signed)
Patient met with medical assistant  Problem List Items Addressed This Visit      Endocrine   Diabetes [facility])    Other Visit Diagnoses    Skin ulcer of toe of right foot, limited to breakdown of skin ([place])    -  Primary   PAD (peripheral artery disease) ([facility])       Relevant Medications   isosorbide dinitrate (ISORDIL) 40 MG tablet   Pes planus, unspecified laterality       Bunion            Diabetic insoles dispensed to patient with wear and usage explained Diabetic insoles fit comfortably patient able to ambulate without pain or discomfort Patient to return to office as scheduled or sooner if problems or issues arise  Landis Martins, DPM

## 2020-11-14 DIAGNOSIS — Z794 Long term (current) use of insulin: Secondary | ICD-10-CM | POA: Diagnosis not present

## 2020-11-14 DIAGNOSIS — E1122 Type 2 diabetes mellitus with diabetic chronic kidney disease: Secondary | ICD-10-CM | POA: Diagnosis not present

## 2020-11-14 DIAGNOSIS — N185 Chronic kidney disease, stage 5: Secondary | ICD-10-CM | POA: Diagnosis not present

## 2020-11-14 DIAGNOSIS — N186 End stage renal disease: Secondary | ICD-10-CM | POA: Diagnosis not present

## 2020-11-14 DIAGNOSIS — Z01810 Encounter for preprocedural cardiovascular examination: Secondary | ICD-10-CM | POA: Diagnosis not present

## 2020-11-14 DIAGNOSIS — I120 Hypertensive chronic kidney disease with stage 5 chronic kidney disease or end stage renal disease: Secondary | ICD-10-CM | POA: Diagnosis not present

## 2020-11-14 DIAGNOSIS — N184 Chronic kidney disease, stage 4 (severe): Secondary | ICD-10-CM | POA: Diagnosis not present

## 2020-11-15 DIAGNOSIS — G4733 Obstructive sleep apnea (adult) (pediatric): Secondary | ICD-10-CM | POA: Diagnosis not present

## 2020-11-22 DIAGNOSIS — I129 Hypertensive chronic kidney disease with stage 1 through stage 4 chronic kidney disease, or unspecified chronic kidney disease: Secondary | ICD-10-CM | POA: Diagnosis not present

## 2020-11-22 DIAGNOSIS — E1122 Type 2 diabetes mellitus with diabetic chronic kidney disease: Secondary | ICD-10-CM | POA: Diagnosis not present

## 2020-11-22 DIAGNOSIS — N183 Chronic kidney disease, stage 3 unspecified: Secondary | ICD-10-CM | POA: Diagnosis not present

## 2020-11-30 DIAGNOSIS — E1122 Type 2 diabetes mellitus with diabetic chronic kidney disease: Secondary | ICD-10-CM | POA: Diagnosis not present

## 2020-11-30 DIAGNOSIS — I10 Essential (primary) hypertension: Secondary | ICD-10-CM | POA: Diagnosis not present

## 2020-11-30 DIAGNOSIS — N186 End stage renal disease: Secondary | ICD-10-CM | POA: Diagnosis not present

## 2020-11-30 DIAGNOSIS — R42 Dizziness and giddiness: Secondary | ICD-10-CM | POA: Diagnosis not present

## 2020-11-30 DIAGNOSIS — Z6825 Body mass index (BMI) 25.0-25.9, adult: Secondary | ICD-10-CM | POA: Diagnosis not present

## 2020-12-04 ENCOUNTER — Encounter (INDEPENDENT_AMBULATORY_CARE_PROVIDER_SITE_OTHER): Payer: PPO | Admitting: Ophthalmology

## 2020-12-04 ENCOUNTER — Other Ambulatory Visit: Payer: Self-pay

## 2020-12-04 DIAGNOSIS — H43813 Vitreous degeneration, bilateral: Secondary | ICD-10-CM | POA: Diagnosis not present

## 2020-12-04 DIAGNOSIS — I10 Essential (primary) hypertension: Secondary | ICD-10-CM | POA: Diagnosis not present

## 2020-12-04 DIAGNOSIS — H353132 Nonexudative age-related macular degeneration, bilateral, intermediate dry stage: Secondary | ICD-10-CM | POA: Diagnosis not present

## 2020-12-04 DIAGNOSIS — H34831 Tributary (branch) retinal vein occlusion, right eye, with macular edema: Secondary | ICD-10-CM | POA: Diagnosis not present

## 2020-12-04 DIAGNOSIS — H35033 Hypertensive retinopathy, bilateral: Secondary | ICD-10-CM

## 2020-12-07 ENCOUNTER — Ambulatory Visit: Payer: PPO | Admitting: Internal Medicine

## 2020-12-11 DIAGNOSIS — J189 Pneumonia, unspecified organism: Secondary | ICD-10-CM | POA: Diagnosis not present

## 2020-12-11 DIAGNOSIS — Z20828 Contact with and (suspected) exposure to other viral communicable diseases: Secondary | ICD-10-CM | POA: Diagnosis not present

## 2020-12-11 DIAGNOSIS — R509 Fever, unspecified: Secondary | ICD-10-CM | POA: Diagnosis not present

## 2020-12-11 DIAGNOSIS — E1122 Type 2 diabetes mellitus with diabetic chronic kidney disease: Secondary | ICD-10-CM | POA: Diagnosis not present

## 2020-12-11 DIAGNOSIS — I120 Hypertensive chronic kidney disease with stage 5 chronic kidney disease or end stage renal disease: Secondary | ICD-10-CM | POA: Diagnosis not present

## 2020-12-11 DIAGNOSIS — N185 Chronic kidney disease, stage 5: Secondary | ICD-10-CM | POA: Diagnosis not present

## 2020-12-11 DIAGNOSIS — Z794 Long term (current) use of insulin: Secondary | ICD-10-CM | POA: Diagnosis not present

## 2020-12-12 DIAGNOSIS — R0902 Hypoxemia: Secondary | ICD-10-CM | POA: Diagnosis not present

## 2020-12-12 DIAGNOSIS — E1165 Type 2 diabetes mellitus with hyperglycemia: Secondary | ICD-10-CM | POA: Diagnosis not present

## 2020-12-12 DIAGNOSIS — N3000 Acute cystitis without hematuria: Secondary | ICD-10-CM | POA: Diagnosis not present

## 2020-12-12 DIAGNOSIS — R0689 Other abnormalities of breathing: Secondary | ICD-10-CM | POA: Diagnosis not present

## 2020-12-12 DIAGNOSIS — J189 Pneumonia, unspecified organism: Secondary | ICD-10-CM | POA: Diagnosis not present

## 2020-12-12 DIAGNOSIS — Z20822 Contact with and (suspected) exposure to covid-19: Secondary | ICD-10-CM | POA: Diagnosis not present

## 2020-12-12 DIAGNOSIS — R059 Cough, unspecified: Secondary | ICD-10-CM | POA: Diagnosis not present

## 2020-12-12 DIAGNOSIS — R5381 Other malaise: Secondary | ICD-10-CM | POA: Diagnosis not present

## 2020-12-12 DIAGNOSIS — J90 Pleural effusion, not elsewhere classified: Secondary | ICD-10-CM | POA: Diagnosis not present

## 2020-12-12 DIAGNOSIS — I10 Essential (primary) hypertension: Secondary | ICD-10-CM | POA: Diagnosis not present

## 2020-12-14 DIAGNOSIS — I130 Hypertensive heart and chronic kidney disease with heart failure and stage 1 through stage 4 chronic kidney disease, or unspecified chronic kidney disease: Secondary | ICD-10-CM | POA: Diagnosis not present

## 2020-12-14 DIAGNOSIS — N390 Urinary tract infection, site not specified: Secondary | ICD-10-CM | POA: Diagnosis not present

## 2020-12-14 DIAGNOSIS — E1122 Type 2 diabetes mellitus with diabetic chronic kidney disease: Secondary | ICD-10-CM | POA: Diagnosis not present

## 2020-12-14 DIAGNOSIS — N184 Chronic kidney disease, stage 4 (severe): Secondary | ICD-10-CM | POA: Diagnosis not present

## 2020-12-14 DIAGNOSIS — I509 Heart failure, unspecified: Secondary | ICD-10-CM | POA: Diagnosis not present

## 2020-12-14 DIAGNOSIS — E261 Secondary hyperaldosteronism: Secondary | ICD-10-CM | POA: Diagnosis not present

## 2020-12-16 DIAGNOSIS — G4733 Obstructive sleep apnea (adult) (pediatric): Secondary | ICD-10-CM | POA: Diagnosis not present

## 2020-12-18 DIAGNOSIS — Z0000 Encounter for general adult medical examination without abnormal findings: Secondary | ICD-10-CM | POA: Diagnosis not present

## 2020-12-18 DIAGNOSIS — Z6825 Body mass index (BMI) 25.0-25.9, adult: Secondary | ICD-10-CM | POA: Diagnosis not present

## 2020-12-18 DIAGNOSIS — Z6826 Body mass index (BMI) 26.0-26.9, adult: Secondary | ICD-10-CM | POA: Diagnosis not present

## 2020-12-18 DIAGNOSIS — Z1331 Encounter for screening for depression: Secondary | ICD-10-CM | POA: Diagnosis not present

## 2020-12-18 DIAGNOSIS — N186 End stage renal disease: Secondary | ICD-10-CM | POA: Diagnosis not present

## 2020-12-18 DIAGNOSIS — N390 Urinary tract infection, site not specified: Secondary | ICD-10-CM | POA: Diagnosis not present

## 2020-12-20 ENCOUNTER — Encounter (INDEPENDENT_AMBULATORY_CARE_PROVIDER_SITE_OTHER): Payer: PPO | Admitting: Ophthalmology

## 2020-12-20 DIAGNOSIS — Z794 Long term (current) use of insulin: Secondary | ICD-10-CM | POA: Diagnosis not present

## 2020-12-20 DIAGNOSIS — N185 Chronic kidney disease, stage 5: Secondary | ICD-10-CM | POA: Diagnosis not present

## 2020-12-20 DIAGNOSIS — I517 Cardiomegaly: Secondary | ICD-10-CM | POA: Diagnosis not present

## 2020-12-20 DIAGNOSIS — N189 Chronic kidney disease, unspecified: Secondary | ICD-10-CM | POA: Diagnosis not present

## 2020-12-20 DIAGNOSIS — E1122 Type 2 diabetes mellitus with diabetic chronic kidney disease: Secondary | ICD-10-CM | POA: Diagnosis not present

## 2020-12-20 DIAGNOSIS — I252 Old myocardial infarction: Secondary | ICD-10-CM | POA: Diagnosis not present

## 2020-12-20 DIAGNOSIS — Z955 Presence of coronary angioplasty implant and graft: Secondary | ICD-10-CM | POA: Diagnosis not present

## 2020-12-20 DIAGNOSIS — Z992 Dependence on renal dialysis: Secondary | ICD-10-CM | POA: Diagnosis not present

## 2020-12-20 DIAGNOSIS — I2510 Atherosclerotic heart disease of native coronary artery without angina pectoris: Secondary | ICD-10-CM | POA: Diagnosis not present

## 2020-12-20 DIAGNOSIS — G4730 Sleep apnea, unspecified: Secondary | ICD-10-CM | POA: Diagnosis not present

## 2020-12-20 DIAGNOSIS — I120 Hypertensive chronic kidney disease with stage 5 chronic kidney disease or end stage renal disease: Secondary | ICD-10-CM | POA: Diagnosis not present

## 2020-12-23 DIAGNOSIS — I129 Hypertensive chronic kidney disease with stage 1 through stage 4 chronic kidney disease, or unspecified chronic kidney disease: Secondary | ICD-10-CM | POA: Diagnosis not present

## 2020-12-23 DIAGNOSIS — N183 Chronic kidney disease, stage 3 unspecified: Secondary | ICD-10-CM | POA: Diagnosis not present

## 2020-12-23 DIAGNOSIS — E1122 Type 2 diabetes mellitus with diabetic chronic kidney disease: Secondary | ICD-10-CM | POA: Diagnosis not present

## 2021-01-01 DIAGNOSIS — E278 Other specified disorders of adrenal gland: Secondary | ICD-10-CM | POA: Diagnosis not present

## 2021-01-01 DIAGNOSIS — E559 Vitamin D deficiency, unspecified: Secondary | ICD-10-CM | POA: Diagnosis not present

## 2021-01-01 DIAGNOSIS — E1122 Type 2 diabetes mellitus with diabetic chronic kidney disease: Secondary | ICD-10-CM | POA: Diagnosis not present

## 2021-01-01 DIAGNOSIS — N185 Chronic kidney disease, stage 5: Secondary | ICD-10-CM | POA: Diagnosis not present

## 2021-01-01 DIAGNOSIS — M9080 Osteopathy in diseases classified elsewhere, unspecified site: Secondary | ICD-10-CM | POA: Diagnosis not present

## 2021-01-01 DIAGNOSIS — I120 Hypertensive chronic kidney disease with stage 5 chronic kidney disease or end stage renal disease: Secondary | ICD-10-CM | POA: Diagnosis not present

## 2021-01-01 DIAGNOSIS — I701 Atherosclerosis of renal artery: Secondary | ICD-10-CM | POA: Diagnosis not present

## 2021-01-01 DIAGNOSIS — Z794 Long term (current) use of insulin: Secondary | ICD-10-CM | POA: Diagnosis not present

## 2021-01-01 DIAGNOSIS — D631 Anemia in chronic kidney disease: Secondary | ICD-10-CM | POA: Diagnosis not present

## 2021-01-01 DIAGNOSIS — E889 Metabolic disorder, unspecified: Secondary | ICD-10-CM | POA: Diagnosis not present

## 2021-01-08 ENCOUNTER — Encounter (INDEPENDENT_AMBULATORY_CARE_PROVIDER_SITE_OTHER): Payer: PPO | Admitting: Ophthalmology

## 2021-01-08 ENCOUNTER — Other Ambulatory Visit: Payer: Self-pay

## 2021-01-08 DIAGNOSIS — H34831 Tributary (branch) retinal vein occlusion, right eye, with macular edema: Secondary | ICD-10-CM

## 2021-01-08 DIAGNOSIS — H43813 Vitreous degeneration, bilateral: Secondary | ICD-10-CM

## 2021-01-08 DIAGNOSIS — I10 Essential (primary) hypertension: Secondary | ICD-10-CM | POA: Diagnosis not present

## 2021-01-08 DIAGNOSIS — H353132 Nonexudative age-related macular degeneration, bilateral, intermediate dry stage: Secondary | ICD-10-CM | POA: Diagnosis not present

## 2021-01-08 DIAGNOSIS — H35033 Hypertensive retinopathy, bilateral: Secondary | ICD-10-CM | POA: Diagnosis not present

## 2021-01-10 NOTE — Progress Notes (Signed)
HPI female never smoker followed for OSA, complicated by CAD/MI/stent, asthma (Dr. Neldon Mc), DM 2, CKD4 NPSG 12/05/10([place]) AHI 14.2/ hr, weight 178 lbs.  PFT 11/07/14-  Moderate Diffusion deficit 60%. Normal flows and lung volumes with insignificant response to bronchodilator. a1AT- 10/07/14-  Nl MM 123 OK  --------------------------------------------------------------------------------------------   06/20/20-  80 year old female never smoker followed for OSA, complicated by CAD/MI/stent, Chronic Diastolic CHF, asthma (Dr. Neldon Mc), DM 2, HBP, CKD5 CPAP auto 5-15/ [facility]/ [employer] Download- compliance 80%, 0.7/ hr Body weight today- 166 lbs Covid vax-2 Moderna Flu vax- Had She is very please with replacement machine and under-nose mask. Sleeps well. Hosp twice this summer- fluid overload, then weakness from meds. Better now. Plans Covax booster- discussed. CXR 02/10/20-  FINDINGS: Trace pleural effusions. No consolidation. Stable cardiomediastinal silhouette. Aortic atherosclerosis. No pneumothorax. IMPRESSION: Trace pleural effusions.  01/11/21- 80 year old female never smoker followed for OSA, complicated by CAD/MI/stent, Chronic Diastolic dCHF, Asthma (Dr. Neldon Mc), DM 2, HBP, CKD5, Anemia,  CPAP auto 5-15/ [facility]HP/ [employer]  Recently replaced       AirSense 11 AutoSet Download- compliance 53%, AHI 1.1/ hr mostly short nights Body weight today-168 lbs Covid vax-3 Moderna Recent Hgb 8.6 Had fistula placed L arm, waiting for it to mature.  Says treated 3 times in past year for pneumonias at [facility]. Not in our records. Says she has had pneumonia vax.  Complains of insomnia- difficulty falling asleep. We discussed trial of otc antihistamine/ melatonin whch she should discuss with her nephrologist.  Glucose monitor alerted today while here "67" so she went to car for snack.   ROS see HPI    + = positive Constitutional:   No-   weight loss, night sweats, fevers, chills, fatigue,  lassitude. HEENT:   No-  headaches, difficulty swallowing, tooth/dental problems, sore throat,       No-  sneezing, itching, ear ache, + nasal congestion, post nasal drip,  CV:  No-   chest pain, orthopnea, PND, + swelling in lower extremities, anasarca,                                            dizziness, + palpitations Resp: No-   shortness of breath with exertion or at rest.              No-   productive cough,  No non-productive cough,  No- coughing up of blood.              No-   change in color of mucus.  No- wheezing.   Skin: No-   rash or lesions. GI:  No-   heartburn, + indigestion, abdominal pain, nausea, vomiting,  GU:  MS:  + joint pain or swelling.   Neuro-     nothing unusual Psych:  No- change in mood or affect. No depression or anxiety.  No memory loss.  OBJ- Physical Exam General- Alert, Oriented, Affect-appropriate, Distress- none acute, not obese Skin- rash-none, lesions- none, excoriation- none Lymphadenopathy- none Head- atraumatic            Eyes- Gross vision intact, PERRLA, conjunctivae and secretions clear            Ears- Hearing, canals-normal            Nose- Clear, no-Septal dev, mucus, polyps, erosion, perforation             Throat- Mallampati  III , mucosa clear , drainage- none, tonsils- atrophic Neck- flexible , trachea midline, no stridor , thyroid nl, carotid no bruit Chest - symmetrical excursion , unlabored           Heart/CV- RRR , no murmur , no gallop  , no rub, nl s1 s2                           - JVD- none , edema- none, stasis changes- none, varices- none           Lung- clear to P&A, wheeze- none, cough- none , dullness+ minimal at bases, rub- none           Chest wall-  Abd-  Br/ Gen/ Rectal- Not done, not indicated Extrem- cyanosis- none, clubbing, none, atrophy- none, strength- nl. Neuro- grossly intact to observation

## 2021-01-11 ENCOUNTER — Encounter: Payer: Self-pay | Admitting: Internal Medicine

## 2021-01-11 ENCOUNTER — Ambulatory Visit: Payer: PPO | Admitting: Internal Medicine

## 2021-01-11 ENCOUNTER — Other Ambulatory Visit: Payer: Self-pay

## 2021-01-11 DIAGNOSIS — F5101 Primary insomnia: Secondary | ICD-10-CM | POA: Diagnosis not present

## 2021-01-11 DIAGNOSIS — N184 Chronic kidney disease, stage 4 (severe): Secondary | ICD-10-CM | POA: Diagnosis not present

## 2021-01-11 DIAGNOSIS — N185 Chronic kidney disease, stage 5: Secondary | ICD-10-CM | POA: Diagnosis not present

## 2021-01-11 DIAGNOSIS — G4733 Obstructive sleep apnea (adult) (pediatric): Secondary | ICD-10-CM

## 2021-01-11 DIAGNOSIS — D631 Anemia in chronic kidney disease: Secondary | ICD-10-CM | POA: Diagnosis not present

## 2021-01-11 NOTE — Assessment & Plan Note (Signed)
Anticipating dialysis Followed by nephrology

## 2021-01-11 NOTE — Assessment & Plan Note (Signed)
Reviewed basic sleep hygiene Plan- try Zzquil, melatonin. Discuss w nephrology before considering other agents

## 2021-01-11 NOTE — Assessment & Plan Note (Signed)
Benefits from CPAP Reviewed compliance goals Plan continue auto 5-15

## 2021-01-11 NOTE — Patient Instructions (Signed)
We can continue PAP auto 5-15. Try to wear it for at least 4-8 hours/night  If ok with your kidney doctor, try usin otc Zzquil and/ or melatonin to help with your insomnia  Please call if we can help

## 2021-01-15 DIAGNOSIS — G4733 Obstructive sleep apnea (adult) (pediatric): Secondary | ICD-10-CM | POA: Diagnosis not present

## 2021-01-18 DIAGNOSIS — D631 Anemia in chronic kidney disease: Secondary | ICD-10-CM | POA: Diagnosis not present

## 2021-01-18 DIAGNOSIS — N185 Chronic kidney disease, stage 5: Secondary | ICD-10-CM | POA: Diagnosis not present

## 2021-01-22 DIAGNOSIS — I129 Hypertensive chronic kidney disease with stage 1 through stage 4 chronic kidney disease, or unspecified chronic kidney disease: Secondary | ICD-10-CM | POA: Diagnosis not present

## 2021-01-22 DIAGNOSIS — N183 Chronic kidney disease, stage 3 unspecified: Secondary | ICD-10-CM | POA: Diagnosis not present

## 2021-01-22 DIAGNOSIS — E1122 Type 2 diabetes mellitus with diabetic chronic kidney disease: Secondary | ICD-10-CM | POA: Diagnosis not present

## 2021-01-25 DIAGNOSIS — T82858A Stenosis of vascular prosthetic devices, implants and grafts, initial encounter: Secondary | ICD-10-CM | POA: Diagnosis not present

## 2021-01-25 DIAGNOSIS — N186 End stage renal disease: Secondary | ICD-10-CM | POA: Diagnosis not present

## 2021-02-01 DIAGNOSIS — D631 Anemia in chronic kidney disease: Secondary | ICD-10-CM | POA: Diagnosis not present

## 2021-02-01 DIAGNOSIS — N185 Chronic kidney disease, stage 5: Secondary | ICD-10-CM | POA: Diagnosis not present

## 2021-02-06 DIAGNOSIS — D631 Anemia in chronic kidney disease: Secondary | ICD-10-CM | POA: Diagnosis not present

## 2021-02-06 DIAGNOSIS — E261 Secondary hyperaldosteronism: Secondary | ICD-10-CM | POA: Diagnosis not present

## 2021-02-06 DIAGNOSIS — I509 Heart failure, unspecified: Secondary | ICD-10-CM | POA: Diagnosis not present

## 2021-02-06 DIAGNOSIS — N2581 Secondary hyperparathyroidism of renal origin: Secondary | ICD-10-CM | POA: Diagnosis not present

## 2021-02-06 DIAGNOSIS — Z992 Dependence on renal dialysis: Secondary | ICD-10-CM | POA: Diagnosis not present

## 2021-02-06 DIAGNOSIS — I110 Hypertensive heart disease with heart failure: Secondary | ICD-10-CM | POA: Diagnosis not present

## 2021-02-06 DIAGNOSIS — N184 Chronic kidney disease, stage 4 (severe): Secondary | ICD-10-CM | POA: Diagnosis not present

## 2021-02-06 DIAGNOSIS — E1122 Type 2 diabetes mellitus with diabetic chronic kidney disease: Secondary | ICD-10-CM | POA: Diagnosis not present

## 2021-02-12 ENCOUNTER — Other Ambulatory Visit: Payer: Self-pay

## 2021-02-12 ENCOUNTER — Encounter (INDEPENDENT_AMBULATORY_CARE_PROVIDER_SITE_OTHER): Payer: PPO | Admitting: Ophthalmology

## 2021-02-12 DIAGNOSIS — H35033 Hypertensive retinopathy, bilateral: Secondary | ICD-10-CM | POA: Diagnosis not present

## 2021-02-12 DIAGNOSIS — H34831 Tributary (branch) retinal vein occlusion, right eye, with macular edema: Secondary | ICD-10-CM

## 2021-02-12 DIAGNOSIS — H353132 Nonexudative age-related macular degeneration, bilateral, intermediate dry stage: Secondary | ICD-10-CM

## 2021-02-12 DIAGNOSIS — I10 Essential (primary) hypertension: Secondary | ICD-10-CM

## 2021-02-12 DIAGNOSIS — H43813 Vitreous degeneration, bilateral: Secondary | ICD-10-CM | POA: Diagnosis not present

## 2021-02-15 DIAGNOSIS — G4733 Obstructive sleep apnea (adult) (pediatric): Secondary | ICD-10-CM | POA: Diagnosis not present

## 2021-02-15 DIAGNOSIS — N185 Chronic kidney disease, stage 5: Secondary | ICD-10-CM | POA: Diagnosis not present

## 2021-02-15 DIAGNOSIS — D631 Anemia in chronic kidney disease: Secondary | ICD-10-CM | POA: Diagnosis not present

## 2021-02-22 DIAGNOSIS — N183 Chronic kidney disease, stage 3 unspecified: Secondary | ICD-10-CM | POA: Diagnosis not present

## 2021-02-22 DIAGNOSIS — E1122 Type 2 diabetes mellitus with diabetic chronic kidney disease: Secondary | ICD-10-CM | POA: Diagnosis not present

## 2021-02-22 DIAGNOSIS — I129 Hypertensive chronic kidney disease with stage 1 through stage 4 chronic kidney disease, or unspecified chronic kidney disease: Secondary | ICD-10-CM | POA: Diagnosis not present

## 2021-02-28 NOTE — Progress Notes (Signed)
Patient in office today for diabetic shoe insoles measurements. Patient's diabetes is being treated by Dr. Helene Kelp at this time. Patient was seen by EJ with [facility] for services needed. Patient advised that the office will call when insoles are available for pick-up. Patient verbalized understanding.

## 2021-03-01 DIAGNOSIS — D631 Anemia in chronic kidney disease: Secondary | ICD-10-CM | POA: Diagnosis not present

## 2021-03-01 DIAGNOSIS — I120 Hypertensive chronic kidney disease with stage 5 chronic kidney disease or end stage renal disease: Secondary | ICD-10-CM | POA: Diagnosis not present

## 2021-03-01 DIAGNOSIS — N185 Chronic kidney disease, stage 5: Secondary | ICD-10-CM | POA: Diagnosis not present

## 2021-03-13 DIAGNOSIS — I701 Atherosclerosis of renal artery: Secondary | ICD-10-CM | POA: Diagnosis not present

## 2021-03-13 DIAGNOSIS — E889 Metabolic disorder, unspecified: Secondary | ICD-10-CM | POA: Diagnosis not present

## 2021-03-13 DIAGNOSIS — E1122 Type 2 diabetes mellitus with diabetic chronic kidney disease: Secondary | ICD-10-CM | POA: Diagnosis not present

## 2021-03-13 DIAGNOSIS — E559 Vitamin D deficiency, unspecified: Secondary | ICD-10-CM | POA: Diagnosis not present

## 2021-03-13 DIAGNOSIS — N185 Chronic kidney disease, stage 5: Secondary | ICD-10-CM | POA: Diagnosis not present

## 2021-03-13 DIAGNOSIS — M9080 Osteopathy in diseases classified elsewhere, unspecified site: Secondary | ICD-10-CM | POA: Diagnosis not present

## 2021-03-13 DIAGNOSIS — I120 Hypertensive chronic kidney disease with stage 5 chronic kidney disease or end stage renal disease: Secondary | ICD-10-CM | POA: Diagnosis not present

## 2021-03-13 DIAGNOSIS — Z794 Long term (current) use of insulin: Secondary | ICD-10-CM | POA: Diagnosis not present

## 2021-03-13 DIAGNOSIS — D631 Anemia in chronic kidney disease: Secondary | ICD-10-CM | POA: Diagnosis not present

## 2021-03-17 DIAGNOSIS — G4733 Obstructive sleep apnea (adult) (pediatric): Secondary | ICD-10-CM | POA: Diagnosis not present

## 2021-03-19 ENCOUNTER — Encounter (INDEPENDENT_AMBULATORY_CARE_PROVIDER_SITE_OTHER): Payer: PPO | Admitting: Ophthalmology

## 2021-03-19 ENCOUNTER — Other Ambulatory Visit: Payer: Self-pay

## 2021-03-19 DIAGNOSIS — H34831 Tributary (branch) retinal vein occlusion, right eye, with macular edema: Secondary | ICD-10-CM

## 2021-03-19 DIAGNOSIS — H35033 Hypertensive retinopathy, bilateral: Secondary | ICD-10-CM | POA: Diagnosis not present

## 2021-03-19 DIAGNOSIS — H43813 Vitreous degeneration, bilateral: Secondary | ICD-10-CM

## 2021-03-19 DIAGNOSIS — I10 Essential (primary) hypertension: Secondary | ICD-10-CM | POA: Diagnosis not present

## 2021-03-19 DIAGNOSIS — H353132 Nonexudative age-related macular degeneration, bilateral, intermediate dry stage: Secondary | ICD-10-CM | POA: Diagnosis not present

## 2021-03-25 DIAGNOSIS — E1122 Type 2 diabetes mellitus with diabetic chronic kidney disease: Secondary | ICD-10-CM | POA: Diagnosis not present

## 2021-03-25 DIAGNOSIS — N183 Chronic kidney disease, stage 3 unspecified: Secondary | ICD-10-CM | POA: Diagnosis not present

## 2021-03-25 DIAGNOSIS — I129 Hypertensive chronic kidney disease with stage 1 through stage 4 chronic kidney disease, or unspecified chronic kidney disease: Secondary | ICD-10-CM | POA: Diagnosis not present

## 2021-03-29 DIAGNOSIS — N185 Chronic kidney disease, stage 5: Secondary | ICD-10-CM | POA: Diagnosis not present

## 2021-03-29 DIAGNOSIS — D631 Anemia in chronic kidney disease: Secondary | ICD-10-CM | POA: Diagnosis not present

## 2021-04-05 DIAGNOSIS — D631 Anemia in chronic kidney disease: Secondary | ICD-10-CM | POA: Diagnosis not present

## 2021-04-05 DIAGNOSIS — N185 Chronic kidney disease, stage 5: Secondary | ICD-10-CM | POA: Diagnosis not present

## 2021-04-05 DIAGNOSIS — E1122 Type 2 diabetes mellitus with diabetic chronic kidney disease: Secondary | ICD-10-CM | POA: Diagnosis not present

## 2021-04-05 DIAGNOSIS — Z992 Dependence on renal dialysis: Secondary | ICD-10-CM | POA: Diagnosis not present

## 2021-04-05 DIAGNOSIS — I120 Hypertensive chronic kidney disease with stage 5 chronic kidney disease or end stage renal disease: Secondary | ICD-10-CM | POA: Diagnosis not present

## 2021-04-05 DIAGNOSIS — Z794 Long term (current) use of insulin: Secondary | ICD-10-CM | POA: Diagnosis not present

## 2021-04-12 DIAGNOSIS — D631 Anemia in chronic kidney disease: Secondary | ICD-10-CM | POA: Diagnosis not present

## 2021-04-12 DIAGNOSIS — N185 Chronic kidney disease, stage 5: Secondary | ICD-10-CM | POA: Diagnosis not present

## 2021-04-17 DIAGNOSIS — G4733 Obstructive sleep apnea (adult) (pediatric): Secondary | ICD-10-CM | POA: Diagnosis not present

## 2021-04-19 DIAGNOSIS — E1122 Type 2 diabetes mellitus with diabetic chronic kidney disease: Secondary | ICD-10-CM | POA: Diagnosis not present

## 2021-04-23 ENCOUNTER — Encounter (INDEPENDENT_AMBULATORY_CARE_PROVIDER_SITE_OTHER): Payer: PPO | Admitting: Ophthalmology

## 2021-04-23 ENCOUNTER — Other Ambulatory Visit: Payer: Self-pay

## 2021-04-23 DIAGNOSIS — H353132 Nonexudative age-related macular degeneration, bilateral, intermediate dry stage: Secondary | ICD-10-CM

## 2021-04-23 DIAGNOSIS — H35033 Hypertensive retinopathy, bilateral: Secondary | ICD-10-CM

## 2021-04-23 DIAGNOSIS — I10 Essential (primary) hypertension: Secondary | ICD-10-CM

## 2021-04-23 DIAGNOSIS — H43813 Vitreous degeneration, bilateral: Secondary | ICD-10-CM

## 2021-04-23 DIAGNOSIS — H34831 Tributary (branch) retinal vein occlusion, right eye, with macular edema: Secondary | ICD-10-CM

## 2021-04-25 DIAGNOSIS — I129 Hypertensive chronic kidney disease with stage 1 through stage 4 chronic kidney disease, or unspecified chronic kidney disease: Secondary | ICD-10-CM | POA: Diagnosis not present

## 2021-04-25 DIAGNOSIS — E1122 Type 2 diabetes mellitus with diabetic chronic kidney disease: Secondary | ICD-10-CM | POA: Diagnosis not present

## 2021-04-25 DIAGNOSIS — N183 Chronic kidney disease, stage 3 unspecified: Secondary | ICD-10-CM | POA: Diagnosis not present

## 2021-04-26 DIAGNOSIS — N185 Chronic kidney disease, stage 5: Secondary | ICD-10-CM | POA: Diagnosis not present

## 2021-04-26 DIAGNOSIS — D631 Anemia in chronic kidney disease: Secondary | ICD-10-CM | POA: Diagnosis not present

## 2021-05-10 DIAGNOSIS — N185 Chronic kidney disease, stage 5: Secondary | ICD-10-CM | POA: Diagnosis not present

## 2021-05-10 DIAGNOSIS — D631 Anemia in chronic kidney disease: Secondary | ICD-10-CM | POA: Diagnosis not present

## 2021-05-14 DIAGNOSIS — Z0000 Encounter for general adult medical examination without abnormal findings: Secondary | ICD-10-CM | POA: Diagnosis not present

## 2021-05-17 DIAGNOSIS — Z23 Encounter for immunization: Secondary | ICD-10-CM | POA: Diagnosis not present

## 2021-05-17 DIAGNOSIS — E1122 Type 2 diabetes mellitus with diabetic chronic kidney disease: Secondary | ICD-10-CM | POA: Diagnosis not present

## 2021-05-17 DIAGNOSIS — N186 End stage renal disease: Secondary | ICD-10-CM | POA: Diagnosis not present

## 2021-05-17 DIAGNOSIS — Z6826 Body mass index (BMI) 26.0-26.9, adult: Secondary | ICD-10-CM | POA: Diagnosis not present

## 2021-05-18 DIAGNOSIS — G4733 Obstructive sleep apnea (adult) (pediatric): Secondary | ICD-10-CM | POA: Diagnosis not present

## 2021-05-24 DIAGNOSIS — N185 Chronic kidney disease, stage 5: Secondary | ICD-10-CM | POA: Diagnosis not present

## 2021-05-24 DIAGNOSIS — D631 Anemia in chronic kidney disease: Secondary | ICD-10-CM | POA: Diagnosis not present

## 2021-05-25 DIAGNOSIS — I10 Essential (primary) hypertension: Secondary | ICD-10-CM | POA: Diagnosis not present

## 2021-05-25 DIAGNOSIS — E7800 Pure hypercholesterolemia, unspecified: Secondary | ICD-10-CM | POA: Diagnosis not present

## 2021-05-25 DIAGNOSIS — E1122 Type 2 diabetes mellitus with diabetic chronic kidney disease: Secondary | ICD-10-CM | POA: Diagnosis not present

## 2021-06-04 ENCOUNTER — Encounter (INDEPENDENT_AMBULATORY_CARE_PROVIDER_SITE_OTHER): Payer: PPO | Admitting: Ophthalmology

## 2021-06-04 ENCOUNTER — Other Ambulatory Visit: Payer: Self-pay

## 2021-06-04 DIAGNOSIS — H34831 Tributary (branch) retinal vein occlusion, right eye, with macular edema: Secondary | ICD-10-CM

## 2021-06-04 DIAGNOSIS — I10 Essential (primary) hypertension: Secondary | ICD-10-CM | POA: Diagnosis not present

## 2021-06-04 DIAGNOSIS — H43813 Vitreous degeneration, bilateral: Secondary | ICD-10-CM

## 2021-06-04 DIAGNOSIS — H35033 Hypertensive retinopathy, bilateral: Secondary | ICD-10-CM

## 2021-06-04 DIAGNOSIS — H353132 Nonexudative age-related macular degeneration, bilateral, intermediate dry stage: Secondary | ICD-10-CM | POA: Diagnosis not present

## 2021-06-07 DIAGNOSIS — N185 Chronic kidney disease, stage 5: Secondary | ICD-10-CM | POA: Diagnosis not present

## 2021-06-07 DIAGNOSIS — I120 Hypertensive chronic kidney disease with stage 5 chronic kidney disease or end stage renal disease: Secondary | ICD-10-CM | POA: Diagnosis not present

## 2021-06-07 DIAGNOSIS — D631 Anemia in chronic kidney disease: Secondary | ICD-10-CM | POA: Diagnosis not present

## 2021-06-12 DIAGNOSIS — I132 Hypertensive heart and chronic kidney disease with heart failure and with stage 5 chronic kidney disease, or end stage renal disease: Secondary | ICD-10-CM | POA: Diagnosis not present

## 2021-06-12 DIAGNOSIS — I701 Atherosclerosis of renal artery: Secondary | ICD-10-CM | POA: Diagnosis not present

## 2021-06-12 DIAGNOSIS — D631 Anemia in chronic kidney disease: Secondary | ICD-10-CM | POA: Diagnosis not present

## 2021-06-12 DIAGNOSIS — Z794 Long term (current) use of insulin: Secondary | ICD-10-CM | POA: Diagnosis not present

## 2021-06-12 DIAGNOSIS — E1122 Type 2 diabetes mellitus with diabetic chronic kidney disease: Secondary | ICD-10-CM | POA: Diagnosis not present

## 2021-06-12 DIAGNOSIS — I5030 Unspecified diastolic (congestive) heart failure: Secondary | ICD-10-CM | POA: Diagnosis not present

## 2021-06-12 DIAGNOSIS — R801 Persistent proteinuria, unspecified: Secondary | ICD-10-CM | POA: Diagnosis not present

## 2021-06-12 DIAGNOSIS — N185 Chronic kidney disease, stage 5: Secondary | ICD-10-CM | POA: Diagnosis not present

## 2021-06-12 DIAGNOSIS — M898X9 Other specified disorders of bone, unspecified site: Secondary | ICD-10-CM | POA: Diagnosis not present

## 2021-06-17 DIAGNOSIS — G4733 Obstructive sleep apnea (adult) (pediatric): Secondary | ICD-10-CM | POA: Diagnosis not present

## 2021-06-21 DIAGNOSIS — D631 Anemia in chronic kidney disease: Secondary | ICD-10-CM | POA: Diagnosis not present

## 2021-06-21 DIAGNOSIS — N185 Chronic kidney disease, stage 5: Secondary | ICD-10-CM | POA: Diagnosis not present

## 2021-06-25 DIAGNOSIS — E7800 Pure hypercholesterolemia, unspecified: Secondary | ICD-10-CM | POA: Diagnosis not present

## 2021-06-25 DIAGNOSIS — I10 Essential (primary) hypertension: Secondary | ICD-10-CM | POA: Diagnosis not present

## 2021-06-25 DIAGNOSIS — E1122 Type 2 diabetes mellitus with diabetic chronic kidney disease: Secondary | ICD-10-CM | POA: Diagnosis not present

## 2021-07-05 DIAGNOSIS — D631 Anemia in chronic kidney disease: Secondary | ICD-10-CM | POA: Diagnosis not present

## 2021-07-05 DIAGNOSIS — N185 Chronic kidney disease, stage 5: Secondary | ICD-10-CM | POA: Diagnosis not present

## 2021-07-11 NOTE — Progress Notes (Signed)
HPI female never smoker followed for OSA, complicated by CAD/MI/stent, asthma (Dr. Neldon Mc), DM 2, CKD4 NPSG 12/05/10([place]) AHI 14.2/ hr, weight 178 lbs.  PFT 11/07/14-  Moderate Diffusion deficit 60%. Normal flows and lung volumes with insignificant response to bronchodilator. a1AT- 10/07/14-  Nl MM 123 OK  --------------------------------------------------------------------------------------------    01/11/21- 80 year old female never smoker followed for OSA, complicated by CAD/MI/stent, Chronic Diastolic dCHF, Asthma (Dr. Neldon Mc), DM 2, HBP, CKD5, Anemia,  CPAP auto 5-15/ [facility]HP/ [employer]  Recently replaced       AirSense 11 AutoSet Download- compliance 53%, AHI 1.1/ hr mostly short nights Body weight today-168 lbs Covid vax-3 Moderna Recent Hgb 8.6 Had fistula placed L arm, waiting for it to mature.  Says treated 3 times in past year for pneumonias at [facility]. Not in our records. Says she has had pneumonia vax.  Complains of insomnia- difficulty falling asleep. We discussed trial of otc antihistamine/ melatonin whch she should discuss with her nephrologist.  Glucose monitor alerted today while here "67" so she went to car for snack.   07/12/21- 80 year old female never smoker followed for OSA, Insomnia, complicated by CAD/MI/stent, Chronic Diastolic dCHF, Asthma (Dr. Neldon Mc), DM 2, HTN, CKD5, Anemia, GERD, -Symbicort 80, albuterol HFA, also has Flovent HFA 110 CPAP auto 5-15/ AHP/ [employer]  Recently replaced       AirSense 11 AutoSet Download- 93%, AHI 1.4/ hr Body weight today-172 lbs Covid vax-3 Moderna Flu vax-had -----Patient states she is doing better with her sleep, no concerns Using ZzzQuil for sleep when needed.  Comfortable with CPAP.  Download reviewed. Notes occasional wheezing with no significant exacerbations.  Asthma managed by her allergy office but hasn't seen Dr Neldon Mc in 2 years- and she says inhalers work well. She has dialysis access in her left arm but still "on  hold".  ROS see HPI    + = positive Constitutional:   No-   weight loss, night sweats, fevers, chills, fatigue, lassitude. HEENT:   No-  headaches, difficulty swallowing, tooth/dental problems, sore throat,       No-  sneezing, itching, ear ache, + nasal congestion, post nasal drip,  CV:  No-   chest pain, orthopnea, PND, + swelling in lower extremities, anasarca,                                            dizziness, + palpitations Resp: No-   shortness of breath with exertion or at rest.              No-   productive cough,  No non-productive cough,  No- coughing up of blood.              No-   change in color of mucus.  Wheezing+ occasional Skin: No-   rash or lesions. GI:  No-   heartburn, + indigestion, abdominal pain, nausea, vomiting,  GU:  MS:  + joint pain or swelling.   Neuro-     nothing unusual Psych:  No- change in mood or affect. No depression or anxiety.  No memory loss.  OBJ- Physical Exam General- Alert, Oriented, Affect-appropriate, Distress- none acute, not obese Skin- rash-none, lesions- none, excoriation- none Lymphadenopathy- none Head- atraumatic            Eyes- Gross vision intact, PERRLA, conjunctivae and secretions clear  Ears- Hearing, canals-normal            Nose- Clear, no-Septal dev, mucus, polyps, erosion, perforation             Throat- Mallampati III , mucosa clear , drainage- none, tonsils- atrophic Neck- flexible , trachea midline, no stridor , thyroid nl, carotid no bruit Chest - symmetrical excursion , unlabored           Heart/CV- RRR , no murmur , no gallop  , no rub, nl s1 s2                           - JVD- none , edema- none, stasis changes- none, varices- none           Lung- clear to P&A, wheeze- none, cough- none , dullness+ minimal at bases, rub- none           Chest wall-  Abd-  Br/ Gen/ Rectal- Not done, not indicated Extrem- + dialysis Access left arm Neuro- grossly intact to observation

## 2021-07-12 ENCOUNTER — Ambulatory Visit: Payer: PPO | Admitting: Internal Medicine

## 2021-07-12 ENCOUNTER — Other Ambulatory Visit: Payer: Self-pay

## 2021-07-12 ENCOUNTER — Encounter: Payer: Self-pay | Admitting: Internal Medicine

## 2021-07-12 DIAGNOSIS — J4530 Mild persistent asthma, uncomplicated: Secondary | ICD-10-CM

## 2021-07-12 DIAGNOSIS — G4733 Obstructive sleep apnea (adult) (pediatric): Secondary | ICD-10-CM

## 2021-07-12 NOTE — Patient Instructions (Signed)
We can continue CPAP auto 5-15  Ok to continue current inhalers  Please call if we can help

## 2021-07-16 ENCOUNTER — Other Ambulatory Visit: Payer: Self-pay

## 2021-07-16 ENCOUNTER — Encounter (INDEPENDENT_AMBULATORY_CARE_PROVIDER_SITE_OTHER): Payer: PPO | Admitting: Ophthalmology

## 2021-07-16 DIAGNOSIS — I10 Essential (primary) hypertension: Secondary | ICD-10-CM

## 2021-07-16 DIAGNOSIS — H35033 Hypertensive retinopathy, bilateral: Secondary | ICD-10-CM | POA: Diagnosis not present

## 2021-07-16 DIAGNOSIS — H353211 Exudative age-related macular degeneration, right eye, with active choroidal neovascularization: Secondary | ICD-10-CM | POA: Diagnosis not present

## 2021-07-16 DIAGNOSIS — H43813 Vitreous degeneration, bilateral: Secondary | ICD-10-CM | POA: Diagnosis not present

## 2021-07-16 DIAGNOSIS — H353122 Nonexudative age-related macular degeneration, left eye, intermediate dry stage: Secondary | ICD-10-CM

## 2021-07-16 DIAGNOSIS — H34831 Tributary (branch) retinal vein occlusion, right eye, with macular edema: Secondary | ICD-10-CM | POA: Diagnosis not present

## 2021-07-18 DIAGNOSIS — G4733 Obstructive sleep apnea (adult) (pediatric): Secondary | ICD-10-CM | POA: Diagnosis not present

## 2021-08-02 DIAGNOSIS — D631 Anemia in chronic kidney disease: Secondary | ICD-10-CM | POA: Diagnosis not present

## 2021-08-02 DIAGNOSIS — N185 Chronic kidney disease, stage 5: Secondary | ICD-10-CM | POA: Diagnosis not present

## 2021-08-02 DIAGNOSIS — R300 Dysuria: Secondary | ICD-10-CM | POA: Diagnosis not present

## 2021-08-02 DIAGNOSIS — I120 Hypertensive chronic kidney disease with stage 5 chronic kidney disease or end stage renal disease: Secondary | ICD-10-CM | POA: Diagnosis not present

## 2021-08-13 ENCOUNTER — Encounter (INDEPENDENT_AMBULATORY_CARE_PROVIDER_SITE_OTHER): Payer: PPO | Admitting: Ophthalmology

## 2021-08-13 ENCOUNTER — Other Ambulatory Visit: Payer: Self-pay

## 2021-08-13 DIAGNOSIS — H34831 Tributary (branch) retinal vein occlusion, right eye, with macular edema: Secondary | ICD-10-CM

## 2021-08-13 DIAGNOSIS — H353211 Exudative age-related macular degeneration, right eye, with active choroidal neovascularization: Secondary | ICD-10-CM | POA: Diagnosis not present

## 2021-08-13 DIAGNOSIS — H43813 Vitreous degeneration, bilateral: Secondary | ICD-10-CM

## 2021-08-13 DIAGNOSIS — H353122 Nonexudative age-related macular degeneration, left eye, intermediate dry stage: Secondary | ICD-10-CM | POA: Diagnosis not present

## 2021-08-13 DIAGNOSIS — H35033 Hypertensive retinopathy, bilateral: Secondary | ICD-10-CM

## 2021-08-13 DIAGNOSIS — I10 Essential (primary) hypertension: Secondary | ICD-10-CM

## 2021-08-14 IMAGING — DX DG CHEST 2V
2 series · 2 of 2 positions shown · non-contrast
Comparison: 06/23/2019

CLINICAL DATA: Shortness of breath, weight gain

EXAM:
CHEST - 2 VIEW

[chest pa]
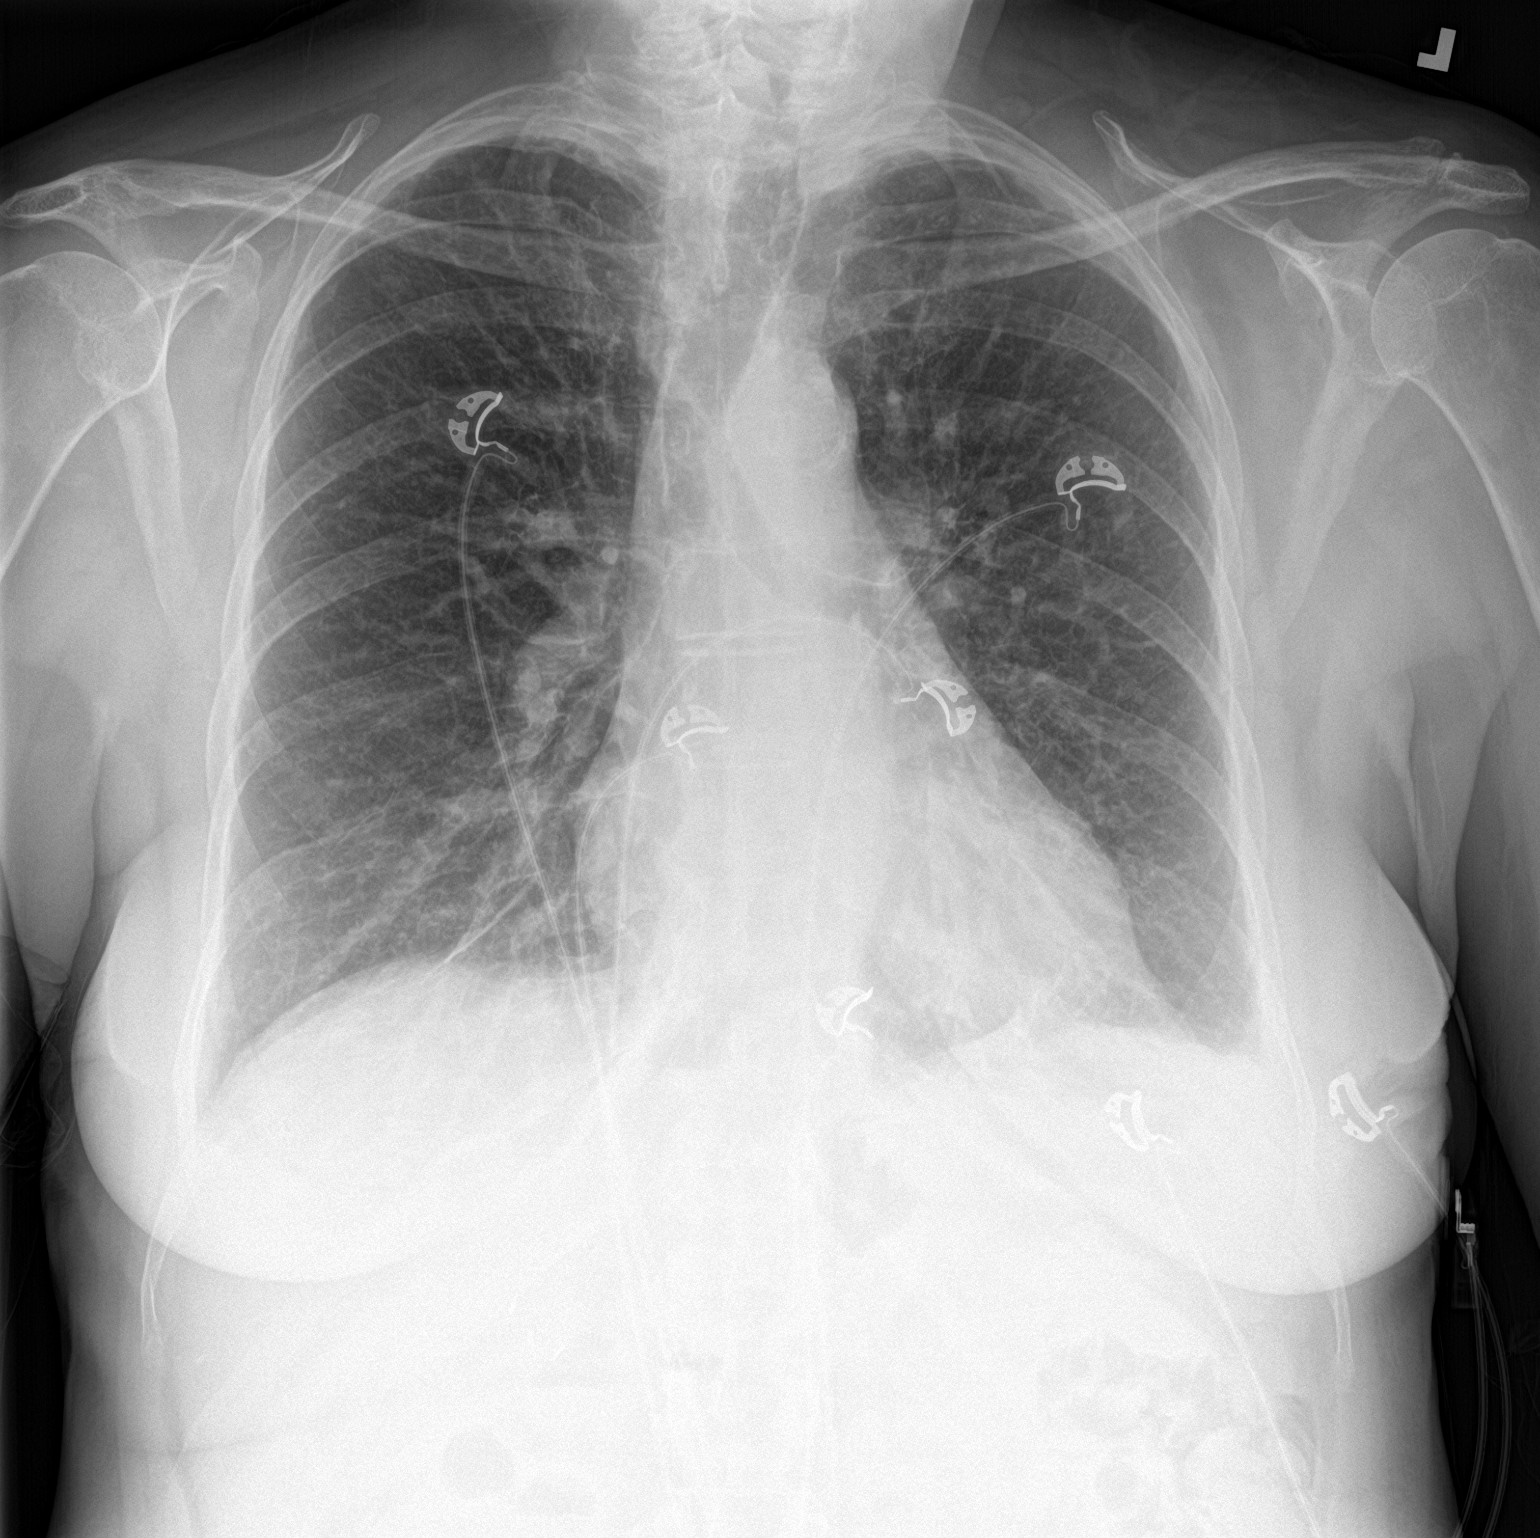

[chest lat]
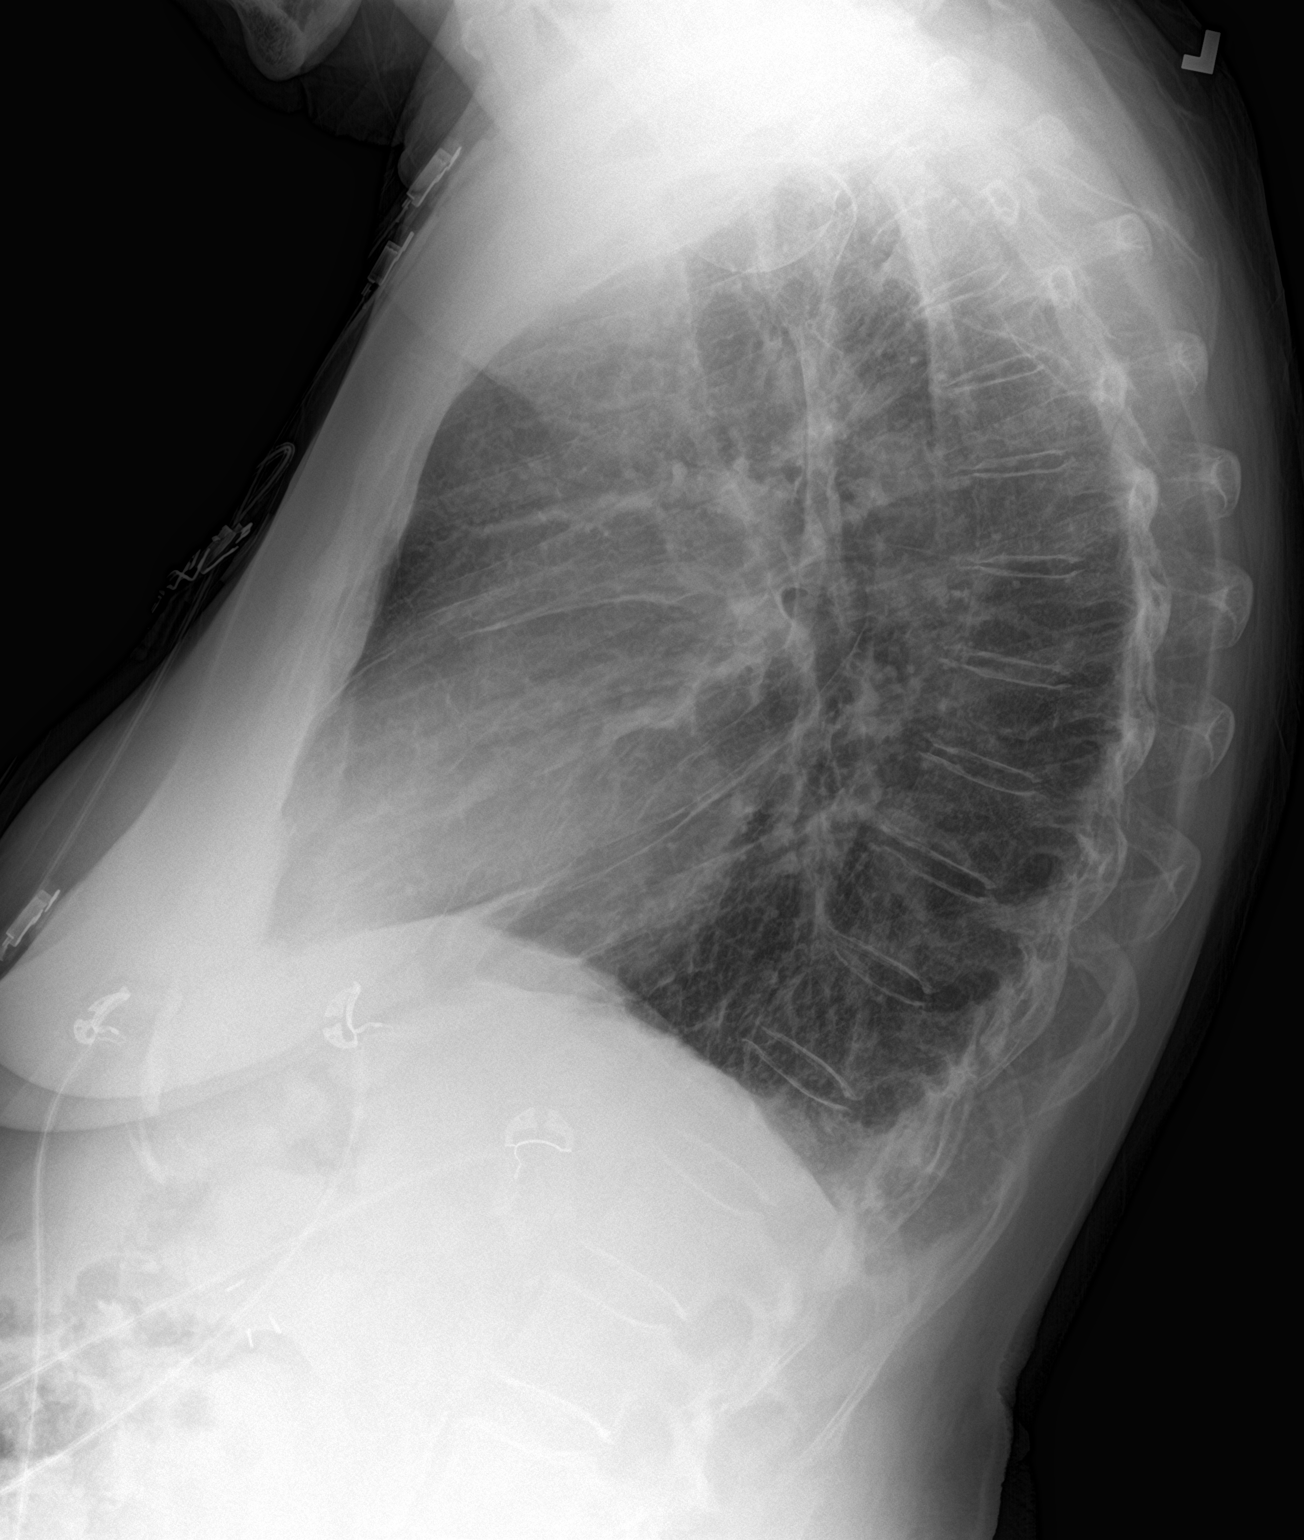

[2 of 2 positions shown; findings below may reference images not displayed]

FINDINGS: Trace pleural effusions. No consolidation. Stable cardiomediastinal
silhouette. Aortic atherosclerosis. No pneumothorax.
IMPRESSION: Trace pleural effusions.

## 2021-08-16 DIAGNOSIS — D631 Anemia in chronic kidney disease: Secondary | ICD-10-CM | POA: Diagnosis not present

## 2021-08-16 DIAGNOSIS — N185 Chronic kidney disease, stage 5: Secondary | ICD-10-CM | POA: Diagnosis not present

## 2021-08-17 DIAGNOSIS — G4733 Obstructive sleep apnea (adult) (pediatric): Secondary | ICD-10-CM | POA: Diagnosis not present

## 2021-08-30 DIAGNOSIS — I120 Hypertensive chronic kidney disease with stage 5 chronic kidney disease or end stage renal disease: Secondary | ICD-10-CM | POA: Diagnosis not present

## 2021-08-30 DIAGNOSIS — D631 Anemia in chronic kidney disease: Secondary | ICD-10-CM | POA: Diagnosis not present

## 2021-08-30 DIAGNOSIS — N185 Chronic kidney disease, stage 5: Secondary | ICD-10-CM | POA: Diagnosis not present

## 2021-09-03 NOTE — Assessment & Plan Note (Signed)
Benefits from CPAP with good compliance and control plan-continue auto 5-15

## 2021-09-03 NOTE — Assessment & Plan Note (Signed)
She continues current meds.  We can refill if needed but she had seen her allergist in the past for this.

## 2021-09-10 ENCOUNTER — Other Ambulatory Visit: Payer: Self-pay

## 2021-09-10 ENCOUNTER — Encounter (INDEPENDENT_AMBULATORY_CARE_PROVIDER_SITE_OTHER): Payer: PPO | Admitting: Ophthalmology

## 2021-09-10 DIAGNOSIS — E113392 Type 2 diabetes mellitus with moderate nonproliferative diabetic retinopathy without macular edema, left eye: Secondary | ICD-10-CM

## 2021-09-10 DIAGNOSIS — H43813 Vitreous degeneration, bilateral: Secondary | ICD-10-CM

## 2021-09-10 DIAGNOSIS — H2703 Aphakia, bilateral: Secondary | ICD-10-CM

## 2021-09-10 DIAGNOSIS — H35033 Hypertensive retinopathy, bilateral: Secondary | ICD-10-CM | POA: Diagnosis not present

## 2021-09-10 DIAGNOSIS — H353122 Nonexudative age-related macular degeneration, left eye, intermediate dry stage: Secondary | ICD-10-CM

## 2021-09-10 DIAGNOSIS — H353211 Exudative age-related macular degeneration, right eye, with active choroidal neovascularization: Secondary | ICD-10-CM

## 2021-09-10 DIAGNOSIS — I10 Essential (primary) hypertension: Secondary | ICD-10-CM

## 2021-09-10 DIAGNOSIS — H34831 Tributary (branch) retinal vein occlusion, right eye, with macular edema: Secondary | ICD-10-CM

## 2021-09-11 DIAGNOSIS — E278 Other specified disorders of adrenal gland: Secondary | ICD-10-CM | POA: Diagnosis not present

## 2021-09-11 DIAGNOSIS — Z794 Long term (current) use of insulin: Secondary | ICD-10-CM | POA: Diagnosis not present

## 2021-09-11 DIAGNOSIS — N185 Chronic kidney disease, stage 5: Secondary | ICD-10-CM | POA: Diagnosis not present

## 2021-09-11 DIAGNOSIS — I120 Hypertensive chronic kidney disease with stage 5 chronic kidney disease or end stage renal disease: Secondary | ICD-10-CM | POA: Diagnosis not present

## 2021-09-11 DIAGNOSIS — D631 Anemia in chronic kidney disease: Secondary | ICD-10-CM | POA: Diagnosis not present

## 2021-09-11 DIAGNOSIS — E1122 Type 2 diabetes mellitus with diabetic chronic kidney disease: Secondary | ICD-10-CM | POA: Diagnosis not present

## 2021-09-11 DIAGNOSIS — E559 Vitamin D deficiency, unspecified: Secondary | ICD-10-CM | POA: Diagnosis not present

## 2021-09-11 DIAGNOSIS — I701 Atherosclerosis of renal artery: Secondary | ICD-10-CM | POA: Diagnosis not present

## 2021-09-14 DIAGNOSIS — Z01419 Encounter for gynecological examination (general) (routine) without abnormal findings: Secondary | ICD-10-CM | POA: Diagnosis not present

## 2021-09-14 DIAGNOSIS — Z90710 Acquired absence of both cervix and uterus: Secondary | ICD-10-CM | POA: Diagnosis not present

## 2021-09-20 DIAGNOSIS — N907 Vulvar cyst: Secondary | ICD-10-CM | POA: Diagnosis not present

## 2021-09-20 DIAGNOSIS — L720 Epidermal cyst: Secondary | ICD-10-CM | POA: Diagnosis not present

## 2021-09-25 DIAGNOSIS — E1122 Type 2 diabetes mellitus with diabetic chronic kidney disease: Secondary | ICD-10-CM | POA: Diagnosis not present

## 2021-09-25 DIAGNOSIS — E7800 Pure hypercholesterolemia, unspecified: Secondary | ICD-10-CM | POA: Diagnosis not present

## 2021-09-25 DIAGNOSIS — I10 Essential (primary) hypertension: Secondary | ICD-10-CM | POA: Diagnosis not present

## 2021-09-27 DIAGNOSIS — D631 Anemia in chronic kidney disease: Secondary | ICD-10-CM | POA: Diagnosis not present

## 2021-09-27 DIAGNOSIS — Z794 Long term (current) use of insulin: Secondary | ICD-10-CM | POA: Diagnosis not present

## 2021-09-27 DIAGNOSIS — I120 Hypertensive chronic kidney disease with stage 5 chronic kidney disease or end stage renal disease: Secondary | ICD-10-CM | POA: Diagnosis not present

## 2021-09-27 DIAGNOSIS — E8779 Other fluid overload: Secondary | ICD-10-CM | POA: Diagnosis not present

## 2021-09-27 DIAGNOSIS — M7989 Other specified soft tissue disorders: Secondary | ICD-10-CM | POA: Diagnosis not present

## 2021-09-27 DIAGNOSIS — E1122 Type 2 diabetes mellitus with diabetic chronic kidney disease: Secondary | ICD-10-CM | POA: Diagnosis not present

## 2021-09-27 DIAGNOSIS — N185 Chronic kidney disease, stage 5: Secondary | ICD-10-CM | POA: Diagnosis not present

## 2021-10-08 ENCOUNTER — Encounter (INDEPENDENT_AMBULATORY_CARE_PROVIDER_SITE_OTHER): Payer: PPO | Admitting: Ophthalmology

## 2021-10-08 ENCOUNTER — Other Ambulatory Visit: Payer: Self-pay

## 2021-10-08 DIAGNOSIS — I10 Essential (primary) hypertension: Secondary | ICD-10-CM

## 2021-10-08 DIAGNOSIS — H34831 Tributary (branch) retinal vein occlusion, right eye, with macular edema: Secondary | ICD-10-CM | POA: Diagnosis not present

## 2021-10-08 DIAGNOSIS — H353122 Nonexudative age-related macular degeneration, left eye, intermediate dry stage: Secondary | ICD-10-CM

## 2021-10-08 DIAGNOSIS — H353211 Exudative age-related macular degeneration, right eye, with active choroidal neovascularization: Secondary | ICD-10-CM | POA: Diagnosis not present

## 2021-10-08 DIAGNOSIS — H35033 Hypertensive retinopathy, bilateral: Secondary | ICD-10-CM | POA: Diagnosis not present

## 2021-10-08 DIAGNOSIS — H43813 Vitreous degeneration, bilateral: Secondary | ICD-10-CM

## 2021-10-11 DIAGNOSIS — D631 Anemia in chronic kidney disease: Secondary | ICD-10-CM | POA: Diagnosis not present

## 2021-10-11 DIAGNOSIS — J90 Pleural effusion, not elsewhere classified: Secondary | ICD-10-CM | POA: Diagnosis not present

## 2021-10-11 DIAGNOSIS — E8779 Other fluid overload: Secondary | ICD-10-CM | POA: Diagnosis not present

## 2021-10-11 DIAGNOSIS — R079 Chest pain, unspecified: Secondary | ICD-10-CM | POA: Diagnosis not present

## 2021-10-11 DIAGNOSIS — Z794 Long term (current) use of insulin: Secondary | ICD-10-CM | POA: Diagnosis not present

## 2021-10-11 DIAGNOSIS — M898X9 Other specified disorders of bone, unspecified site: Secondary | ICD-10-CM | POA: Diagnosis not present

## 2021-10-11 DIAGNOSIS — E1122 Type 2 diabetes mellitus with diabetic chronic kidney disease: Secondary | ICD-10-CM | POA: Diagnosis not present

## 2021-10-11 DIAGNOSIS — Z1159 Encounter for screening for other viral diseases: Secondary | ICD-10-CM | POA: Diagnosis not present

## 2021-10-11 DIAGNOSIS — E559 Vitamin D deficiency, unspecified: Secondary | ICD-10-CM | POA: Diagnosis not present

## 2021-10-11 DIAGNOSIS — R0602 Shortness of breath: Secondary | ICD-10-CM | POA: Diagnosis not present

## 2021-10-11 DIAGNOSIS — M7989 Other specified soft tissue disorders: Secondary | ICD-10-CM | POA: Diagnosis not present

## 2021-10-11 DIAGNOSIS — I700 Atherosclerosis of aorta: Secondary | ICD-10-CM | POA: Diagnosis not present

## 2021-10-11 DIAGNOSIS — N185 Chronic kidney disease, stage 5: Secondary | ICD-10-CM | POA: Diagnosis not present

## 2021-10-11 DIAGNOSIS — I120 Hypertensive chronic kidney disease with stage 5 chronic kidney disease or end stage renal disease: Secondary | ICD-10-CM | POA: Diagnosis not present

## 2021-10-16 DIAGNOSIS — D631 Anemia in chronic kidney disease: Secondary | ICD-10-CM | POA: Diagnosis not present

## 2021-10-16 DIAGNOSIS — D509 Iron deficiency anemia, unspecified: Secondary | ICD-10-CM | POA: Diagnosis not present

## 2021-10-16 DIAGNOSIS — N2581 Secondary hyperparathyroidism of renal origin: Secondary | ICD-10-CM | POA: Diagnosis not present

## 2021-10-16 DIAGNOSIS — N186 End stage renal disease: Secondary | ICD-10-CM | POA: Diagnosis not present

## 2021-10-16 DIAGNOSIS — E119 Type 2 diabetes mellitus without complications: Secondary | ICD-10-CM | POA: Diagnosis not present

## 2021-10-23 DIAGNOSIS — N186 End stage renal disease: Secondary | ICD-10-CM | POA: Diagnosis not present

## 2021-10-23 DIAGNOSIS — I10 Essential (primary) hypertension: Secondary | ICD-10-CM | POA: Diagnosis not present

## 2021-10-23 DIAGNOSIS — E7800 Pure hypercholesterolemia, unspecified: Secondary | ICD-10-CM | POA: Diagnosis not present

## 2021-10-23 DIAGNOSIS — Z992 Dependence on renal dialysis: Secondary | ICD-10-CM | POA: Diagnosis not present

## 2021-10-23 DIAGNOSIS — E1122 Type 2 diabetes mellitus with diabetic chronic kidney disease: Secondary | ICD-10-CM | POA: Diagnosis not present

## 2021-10-25 DIAGNOSIS — D631 Anemia in chronic kidney disease: Secondary | ICD-10-CM | POA: Diagnosis not present

## 2021-10-25 DIAGNOSIS — N186 End stage renal disease: Secondary | ICD-10-CM | POA: Diagnosis not present

## 2021-10-25 DIAGNOSIS — E1122 Type 2 diabetes mellitus with diabetic chronic kidney disease: Secondary | ICD-10-CM | POA: Diagnosis not present

## 2021-10-25 DIAGNOSIS — D509 Iron deficiency anemia, unspecified: Secondary | ICD-10-CM | POA: Diagnosis not present

## 2021-10-25 DIAGNOSIS — Z23 Encounter for immunization: Secondary | ICD-10-CM | POA: Diagnosis not present

## 2021-10-25 DIAGNOSIS — N2581 Secondary hyperparathyroidism of renal origin: Secondary | ICD-10-CM | POA: Diagnosis not present

## 2021-11-05 ENCOUNTER — Other Ambulatory Visit: Payer: Self-pay

## 2021-11-05 ENCOUNTER — Encounter (INDEPENDENT_AMBULATORY_CARE_PROVIDER_SITE_OTHER): Payer: PPO | Admitting: Ophthalmology

## 2021-11-05 DIAGNOSIS — H43813 Vitreous degeneration, bilateral: Secondary | ICD-10-CM

## 2021-11-05 DIAGNOSIS — I10 Essential (primary) hypertension: Secondary | ICD-10-CM

## 2021-11-05 DIAGNOSIS — H353211 Exudative age-related macular degeneration, right eye, with active choroidal neovascularization: Secondary | ICD-10-CM | POA: Diagnosis not present

## 2021-11-05 DIAGNOSIS — H353122 Nonexudative age-related macular degeneration, left eye, intermediate dry stage: Secondary | ICD-10-CM

## 2021-11-05 DIAGNOSIS — H34831 Tributary (branch) retinal vein occlusion, right eye, with macular edema: Secondary | ICD-10-CM | POA: Diagnosis not present

## 2021-11-05 DIAGNOSIS — H35033 Hypertensive retinopathy, bilateral: Secondary | ICD-10-CM

## 2021-11-20 DIAGNOSIS — Z1159 Encounter for screening for other viral diseases: Secondary | ICD-10-CM | POA: Diagnosis not present

## 2021-11-23 DIAGNOSIS — E782 Mixed hyperlipidemia: Secondary | ICD-10-CM | POA: Diagnosis not present

## 2021-11-23 DIAGNOSIS — N186 End stage renal disease: Secondary | ICD-10-CM | POA: Diagnosis not present

## 2021-11-23 DIAGNOSIS — Z992 Dependence on renal dialysis: Secondary | ICD-10-CM | POA: Diagnosis not present

## 2021-11-23 DIAGNOSIS — I10 Essential (primary) hypertension: Secondary | ICD-10-CM | POA: Diagnosis not present

## 2021-11-24 DIAGNOSIS — N2581 Secondary hyperparathyroidism of renal origin: Secondary | ICD-10-CM | POA: Diagnosis not present

## 2021-11-24 DIAGNOSIS — D509 Iron deficiency anemia, unspecified: Secondary | ICD-10-CM | POA: Diagnosis not present

## 2021-11-24 DIAGNOSIS — D631 Anemia in chronic kidney disease: Secondary | ICD-10-CM | POA: Diagnosis not present

## 2021-11-24 DIAGNOSIS — N186 End stage renal disease: Secondary | ICD-10-CM | POA: Diagnosis not present

## 2021-11-29 DIAGNOSIS — E1122 Type 2 diabetes mellitus with diabetic chronic kidney disease: Secondary | ICD-10-CM | POA: Diagnosis not present

## 2021-12-04 DIAGNOSIS — Z1231 Encounter for screening mammogram for malignant neoplasm of breast: Secondary | ICD-10-CM | POA: Diagnosis not present

## 2021-12-07 ENCOUNTER — Telehealth: Payer: Self-pay | Admitting: Cardiology

## 2021-12-07 NOTE — Telephone Encounter (Signed)
Called, spoke with pt. She stated that she goes to dialysis 3 x a week since Feb. Her ankles and legs are swollen and she has some shortness of breath occasionally. Her legs and calves are not red or hot to touch. She would like an appointment to be seen. Any advice now or is an appt sufficient? ?

## 2021-12-07 NOTE — Telephone Encounter (Signed)
Called but line gives a busy signal ?

## 2021-12-07 NOTE — Telephone Encounter (Signed)
Called pt and advised of Dr. Joya Gaskins note. She stated that she had contacted her Nephrologist and her Lasix has been doubled. Any further advice? ?

## 2021-12-07 NOTE — Telephone Encounter (Signed)
Pt stated that she would be having dialysis on Saturday. ?

## 2021-12-07 NOTE — Telephone Encounter (Signed)
Pt c/o swelling: STAT is pt has developed SOB within 24 hours ? ?If swelling, where is the swelling located? Legs, and feet are swelling-  she can hardly walk- they are hard as a rock- patient says she is on Dialysis  ? ?How much weight have you gained and in what time span?  Do not know ? ?Have you gained 3 pounds in a day or 5 pounds in a week?  ? ?Do you have a log of your daily weights (if so, list)?  ? ?Are you currently taking a fluid pill? yes ? ?Are you currently SOB? Not at this time, but does have some ? ?Have you traveled recently? Patient would like to be seen asap please ? ?

## 2021-12-10 ENCOUNTER — Encounter (INDEPENDENT_AMBULATORY_CARE_PROVIDER_SITE_OTHER): Payer: PPO | Admitting: Ophthalmology

## 2021-12-10 DIAGNOSIS — I10 Essential (primary) hypertension: Secondary | ICD-10-CM | POA: Diagnosis not present

## 2021-12-10 DIAGNOSIS — H353211 Exudative age-related macular degeneration, right eye, with active choroidal neovascularization: Secondary | ICD-10-CM | POA: Diagnosis not present

## 2021-12-10 DIAGNOSIS — E113392 Type 2 diabetes mellitus with moderate nonproliferative diabetic retinopathy without macular edema, left eye: Secondary | ICD-10-CM | POA: Diagnosis not present

## 2021-12-10 DIAGNOSIS — H35033 Hypertensive retinopathy, bilateral: Secondary | ICD-10-CM | POA: Diagnosis not present

## 2021-12-10 DIAGNOSIS — H353122 Nonexudative age-related macular degeneration, left eye, intermediate dry stage: Secondary | ICD-10-CM

## 2021-12-10 DIAGNOSIS — H43813 Vitreous degeneration, bilateral: Secondary | ICD-10-CM | POA: Diagnosis not present

## 2021-12-10 DIAGNOSIS — H34831 Tributary (branch) retinal vein occlusion, right eye, with macular edema: Secondary | ICD-10-CM | POA: Diagnosis not present

## 2021-12-23 DIAGNOSIS — E7800 Pure hypercholesterolemia, unspecified: Secondary | ICD-10-CM | POA: Diagnosis not present

## 2021-12-23 DIAGNOSIS — I10 Essential (primary) hypertension: Secondary | ICD-10-CM | POA: Diagnosis not present

## 2021-12-23 DIAGNOSIS — N186 End stage renal disease: Secondary | ICD-10-CM | POA: Diagnosis not present

## 2021-12-23 DIAGNOSIS — Z992 Dependence on renal dialysis: Secondary | ICD-10-CM | POA: Diagnosis not present

## 2021-12-23 DIAGNOSIS — E1122 Type 2 diabetes mellitus with diabetic chronic kidney disease: Secondary | ICD-10-CM | POA: Diagnosis not present

## 2021-12-24 DIAGNOSIS — N186 End stage renal disease: Secondary | ICD-10-CM | POA: Diagnosis not present

## 2021-12-24 DIAGNOSIS — Z0000 Encounter for general adult medical examination without abnormal findings: Secondary | ICD-10-CM | POA: Diagnosis not present

## 2021-12-24 DIAGNOSIS — Z6826 Body mass index (BMI) 26.0-26.9, adult: Secondary | ICD-10-CM | POA: Diagnosis not present

## 2021-12-24 DIAGNOSIS — Z1331 Encounter for screening for depression: Secondary | ICD-10-CM | POA: Diagnosis not present

## 2021-12-24 DIAGNOSIS — E1122 Type 2 diabetes mellitus with diabetic chronic kidney disease: Secondary | ICD-10-CM | POA: Diagnosis not present

## 2021-12-25 DIAGNOSIS — N186 End stage renal disease: Secondary | ICD-10-CM | POA: Diagnosis not present

## 2021-12-25 DIAGNOSIS — D509 Iron deficiency anemia, unspecified: Secondary | ICD-10-CM | POA: Diagnosis not present

## 2021-12-25 DIAGNOSIS — N2581 Secondary hyperparathyroidism of renal origin: Secondary | ICD-10-CM | POA: Diagnosis not present

## 2021-12-25 DIAGNOSIS — D631 Anemia in chronic kidney disease: Secondary | ICD-10-CM | POA: Diagnosis not present

## 2021-12-27 DIAGNOSIS — E1122 Type 2 diabetes mellitus with diabetic chronic kidney disease: Secondary | ICD-10-CM | POA: Diagnosis not present

## 2021-12-28 DIAGNOSIS — M85831 Other specified disorders of bone density and structure, right forearm: Secondary | ICD-10-CM | POA: Diagnosis not present

## 2021-12-28 DIAGNOSIS — M810 Age-related osteoporosis without current pathological fracture: Secondary | ICD-10-CM | POA: Diagnosis not present

## 2021-12-28 DIAGNOSIS — N959 Unspecified menopausal and perimenopausal disorder: Secondary | ICD-10-CM | POA: Diagnosis not present

## 2022-01-04 DIAGNOSIS — M810 Age-related osteoporosis without current pathological fracture: Secondary | ICD-10-CM | POA: Diagnosis not present

## 2022-01-07 ENCOUNTER — Encounter (INDEPENDENT_AMBULATORY_CARE_PROVIDER_SITE_OTHER): Payer: PPO | Admitting: Ophthalmology

## 2022-01-07 DIAGNOSIS — H34831 Tributary (branch) retinal vein occlusion, right eye, with macular edema: Secondary | ICD-10-CM | POA: Diagnosis not present

## 2022-01-07 DIAGNOSIS — H43813 Vitreous degeneration, bilateral: Secondary | ICD-10-CM

## 2022-01-07 DIAGNOSIS — H35033 Hypertensive retinopathy, bilateral: Secondary | ICD-10-CM

## 2022-01-07 DIAGNOSIS — H353211 Exudative age-related macular degeneration, right eye, with active choroidal neovascularization: Secondary | ICD-10-CM

## 2022-01-07 DIAGNOSIS — H353122 Nonexudative age-related macular degeneration, left eye, intermediate dry stage: Secondary | ICD-10-CM | POA: Diagnosis not present

## 2022-01-07 DIAGNOSIS — E113392 Type 2 diabetes mellitus with moderate nonproliferative diabetic retinopathy without macular edema, left eye: Secondary | ICD-10-CM

## 2022-01-07 DIAGNOSIS — I10 Essential (primary) hypertension: Secondary | ICD-10-CM | POA: Diagnosis not present

## 2022-01-14 ENCOUNTER — Ambulatory Visit: Payer: PPO | Admitting: Podiatry

## 2022-01-14 DIAGNOSIS — L97512 Non-pressure chronic ulcer of other part of right foot with fat layer exposed: Secondary | ICD-10-CM | POA: Diagnosis not present

## 2022-01-14 DIAGNOSIS — L03031 Cellulitis of right toe: Secondary | ICD-10-CM | POA: Diagnosis not present

## 2022-01-14 DIAGNOSIS — L97511 Non-pressure chronic ulcer of other part of right foot limited to breakdown of skin: Secondary | ICD-10-CM | POA: Diagnosis not present

## 2022-01-14 MED ORDER — CEPHALEXIN 500 MG PO CAPS: 500.0000 mg | ORAL_CAPSULE | ORAL | 0 refills | Status: DC

## 2022-01-14 MED ORDER — MUPIROCIN 2 % EX OINT: 1.0000 "application " | TOPICAL_OINTMENT | Freq: Two times a day (BID) | CUTANEOUS | 2 refills | Status: DC

## 2022-01-14 NOTE — Progress Notes (Signed)
  Subjective:  Patient ID: Leah Walsh, female    DOB: [birth year],  MRN: [id number]  Pain and swelling around callus right great toe  81 y.o. female presents with the above complaint. History confirmed with patient.  She is a for the last few days the toe has become swollen painful red and the callus seems to have fluid buildup around  Objective:  Physical Exam: warm, good capillary refill, no trophic changes or ulcerative lesions, normal DP and PT pulses, and large medial hyperkeratosis medial right hallux, debridement of this reveals ulceration deep to it measuring 2.0x1.5 time 0.4 cm, there is seropurulent drainage as well.,  Minor erythema around the area with cellulitis to the MPJ.    Assessment:   1. Skin ulcer of toe of right foot with fat layer exposed ([place])   2. Cellulitis of great toe, right      Plan:  Patient was evaluated and treated and all questions answered.  Ulcer right great toe -We discussed the etiology and factors that are a part of the wound healing process.  We also discussed the risk of infection both soft tissue and osteomyelitis from open ulceration.  Discussed the risk of limb loss if this happens or worsens. -Debridement as below. -Dressed with Iodosorb, DSD. -Continue home dressing changes daily with 4 x 4 gauze and mupirocin ointment -Vascular testing currently not indicated -She does have some purulent drainage and cellulitis today I prescribed her Keflex as a precaution to take after dialysis.  A wound culture was taken as well we will change this as needed.  Procedure: Excisional Debridement of Wound Rationale: Removal of non-viable soft tissue from the wound to promote healing.  Anesthesia: none Post-Debridement Wound Measurements: 2.0 cm x 1.5 cm x 0.4 cm  Type of Debridement: Sharp Excisional Tissue Removed: Non-viable soft tissue Depth of Debridement: subcutaneous tissue. Technique: Sharp excisional debridement to bleeding, viable wound base.   Dressing: Dry, sterile, compression dressing. Disposition: Patient tolerated procedure well.           Return in about 3 weeks (around 02/04/2022) for wound care.

## 2022-01-18 LAB — WOUND CULTURE
MICRO NUMBER:: 13427583
RESULT:: NO GROWTH
SPECIMEN QUALITY:: ADEQUATE

## 2022-01-23 DIAGNOSIS — Z992 Dependence on renal dialysis: Secondary | ICD-10-CM | POA: Diagnosis not present

## 2022-01-23 DIAGNOSIS — N186 End stage renal disease: Secondary | ICD-10-CM | POA: Diagnosis not present

## 2022-01-24 DIAGNOSIS — D509 Iron deficiency anemia, unspecified: Secondary | ICD-10-CM | POA: Diagnosis not present

## 2022-01-24 DIAGNOSIS — N2581 Secondary hyperparathyroidism of renal origin: Secondary | ICD-10-CM | POA: Diagnosis not present

## 2022-01-24 DIAGNOSIS — N186 End stage renal disease: Secondary | ICD-10-CM | POA: Diagnosis not present

## 2022-01-24 DIAGNOSIS — D631 Anemia in chronic kidney disease: Secondary | ICD-10-CM | POA: Diagnosis not present

## 2022-01-28 ENCOUNTER — Ambulatory Visit: Payer: PPO | Admitting: Podiatry

## 2022-01-28 DIAGNOSIS — L97512 Non-pressure chronic ulcer of other part of right foot with fat layer exposed: Secondary | ICD-10-CM

## 2022-01-28 NOTE — Progress Notes (Signed)
  Subjective:  Patient ID: Leah Walsh, female    DOB: 28-Apr-1941,  MRN: [id number]  Pain and swelling around callus right great toe  81 y.o. female presents with the above complaint. History confirmed with patient.  She took the antibiotics as directed, feeling is doing better she has been dressing at home with the ointment  Objective:  Physical Exam: warm, good capillary refill, no trophic changes or ulcerative lesions, normal DP and PT pulses, and large medial hyperkeratosis medial right hallux, debridement of this reveals ulceration deep to it measuring 0.6 x 0.3 x 0.3 cm, there is seropurulent drainage as well.,  Minor erythema around the area with cellulitis to the MPJ.     Assessment:   1. Skin ulcer of toe of right foot with fat layer exposed ([place])      Plan:  Patient was evaluated and treated and all questions answered.  Ulcer right great toe -We discussed the etiology and factors that are a part of the wound healing process.  We also discussed the risk of infection both soft tissue and osteomyelitis from open ulceration.  Discussed the risk of limb loss if this happens or worsens. -Debridement as below. -Dressed with Iodosorb, DSD. -Continue home dressing changes daily with 4 x 4 gauze and mupirocin ointment -Vascular testing currently not indicated -No further antibiotics indicated -Dispensed a silicone toe cap to wear when she is in close shoes otherwise does not need to wear this.  Hopefully this will offload pressure here and reduce the amount of hyperkeratosis appointments.  Procedure: Excisional Debridement of Wound Rationale: Removal of non-viable soft tissue from the wound to promote healing.  Anesthesia: none Post-Debridement Wound Measurements: 0.6 x 0.3 x 0.3 cm Type of Debridement: Sharp Excisional Tissue Removed: Non-viable soft tissue Depth of Debridement: subcutaneous tissue. Technique: Sharp excisional debridement to bleeding, viable wound base.   Dressing: Dry, sterile, compression dressing. Disposition: Patient tolerated procedure well.           Return in about 2 weeks (around 02/11/2022) for wound care.

## 2022-01-31 DIAGNOSIS — E1122 Type 2 diabetes mellitus with diabetic chronic kidney disease: Secondary | ICD-10-CM | POA: Diagnosis not present

## 2022-02-04 ENCOUNTER — Encounter (INDEPENDENT_AMBULATORY_CARE_PROVIDER_SITE_OTHER): Payer: PPO | Admitting: Ophthalmology

## 2022-02-04 DIAGNOSIS — E113392 Type 2 diabetes mellitus with moderate nonproliferative diabetic retinopathy without macular edema, left eye: Secondary | ICD-10-CM | POA: Diagnosis not present

## 2022-02-04 DIAGNOSIS — H35033 Hypertensive retinopathy, bilateral: Secondary | ICD-10-CM | POA: Diagnosis not present

## 2022-02-04 DIAGNOSIS — H43813 Vitreous degeneration, bilateral: Secondary | ICD-10-CM | POA: Diagnosis not present

## 2022-02-04 DIAGNOSIS — H34831 Tributary (branch) retinal vein occlusion, right eye, with macular edema: Secondary | ICD-10-CM | POA: Diagnosis not present

## 2022-02-04 DIAGNOSIS — I10 Essential (primary) hypertension: Secondary | ICD-10-CM | POA: Diagnosis not present

## 2022-02-04 DIAGNOSIS — H353112 Nonexudative age-related macular degeneration, right eye, intermediate dry stage: Secondary | ICD-10-CM

## 2022-02-11 ENCOUNTER — Ambulatory Visit: Payer: PPO | Admitting: Podiatry

## 2022-02-11 DIAGNOSIS — L97512 Non-pressure chronic ulcer of other part of right foot with fat layer exposed: Secondary | ICD-10-CM

## 2022-02-11 MED ORDER — CEPHALEXIN 500 MG PO CAPS: 500.0000 mg | ORAL_CAPSULE | ORAL | 0 refills | Status: DC

## 2022-02-11 NOTE — Progress Notes (Signed)
  Subjective:  Patient ID: Leah Walsh, female    DOB: December 06, 1940,  MRN: [id number]  Pain and swelling around callus right great toe  81 y.o. female presents with the above complaint. History confirmed with patient.  She completed the antibiotic has noticed drainage persistent  Objective:  Physical Exam: warm, good capillary refill, no trophic changes or ulcerative lesions, normal DP and PT pulses, and large medial hyperkeratosis medial right hallux, debridement of this reveals ulceration deep to it measuring 2 distinct areas today 0.4 x 0.2 x 0.2 cm and 0.3 x 0.3 x 0.3 cm proximal and dorsal to this, there is bloody drainage drainge as well.  Minor erythema again here today      Assessment:   1. Skin ulcer of toe of right foot with fat layer exposed ([place])      Plan:  Patient was evaluated and treated and all questions answered.  Ulcer right great toe -We discussed the etiology and factors that are a part of the wound healing process.  We also discussed the risk of infection both soft tissue and osteomyelitis from open ulceration.  Discussed the risk of limb loss if this happens or worsens. -Debridement as below. -Dressed with Iodosorb, DSD. -Continue home dressing changes daily with 4 x 4 gauze and mupirocin ointment -Vascular testing currently not indicated -Refilled Keflex with the amount of erythema and drainage she has today -Offload with surgical shoe which was dispensed today as well -If not improved by next visit plan for MRI  Procedure: Excisional Debridement of Wound Rationale: Removal of non-viable soft tissue from the wound to promote healing.  Anesthesia: none Post-Debridement Wound Measurements: 0.4 x 0.2 x 0.2 cm and 0.3 x 0.3 x 0.3 cm Type of Debridement: Sharp Excisional Tissue Removed: Non-viable soft tissue Depth of Debridement: subcutaneous tissue. Technique: Sharp excisional debridement to bleeding, viable wound base.  Dressing: Dry, sterile, compression  dressing. Disposition: Patient tolerated procedure well.           Return in about 3 weeks (around 03/04/2022) for wound care.

## 2022-02-18 DIAGNOSIS — Z6827 Body mass index (BMI) 27.0-27.9, adult: Secondary | ICD-10-CM | POA: Diagnosis not present

## 2022-02-18 DIAGNOSIS — M545 Low back pain, unspecified: Secondary | ICD-10-CM | POA: Diagnosis not present

## 2022-02-18 DIAGNOSIS — Z1211 Encounter for screening for malignant neoplasm of colon: Secondary | ICD-10-CM | POA: Diagnosis not present

## 2022-02-18 DIAGNOSIS — L819 Disorder of pigmentation, unspecified: Secondary | ICD-10-CM | POA: Diagnosis not present

## 2022-02-22 DIAGNOSIS — E1122 Type 2 diabetes mellitus with diabetic chronic kidney disease: Secondary | ICD-10-CM | POA: Diagnosis not present

## 2022-02-22 DIAGNOSIS — Z992 Dependence on renal dialysis: Secondary | ICD-10-CM | POA: Diagnosis not present

## 2022-02-22 DIAGNOSIS — I10 Essential (primary) hypertension: Secondary | ICD-10-CM | POA: Diagnosis not present

## 2022-02-22 DIAGNOSIS — N186 End stage renal disease: Secondary | ICD-10-CM | POA: Diagnosis not present

## 2022-02-22 DIAGNOSIS — E7800 Pure hypercholesterolemia, unspecified: Secondary | ICD-10-CM | POA: Diagnosis not present

## 2022-02-23 DIAGNOSIS — N186 End stage renal disease: Secondary | ICD-10-CM | POA: Diagnosis not present

## 2022-02-23 DIAGNOSIS — N2581 Secondary hyperparathyroidism of renal origin: Secondary | ICD-10-CM | POA: Diagnosis not present

## 2022-02-23 DIAGNOSIS — D509 Iron deficiency anemia, unspecified: Secondary | ICD-10-CM | POA: Diagnosis not present

## 2022-02-23 DIAGNOSIS — D631 Anemia in chronic kidney disease: Secondary | ICD-10-CM | POA: Diagnosis not present

## 2022-02-28 DIAGNOSIS — E1122 Type 2 diabetes mellitus with diabetic chronic kidney disease: Secondary | ICD-10-CM | POA: Diagnosis not present

## 2022-03-04 ENCOUNTER — Encounter (INDEPENDENT_AMBULATORY_CARE_PROVIDER_SITE_OTHER): Payer: PPO | Admitting: Ophthalmology

## 2022-03-04 DIAGNOSIS — H34831 Tributary (branch) retinal vein occlusion, right eye, with macular edema: Secondary | ICD-10-CM | POA: Diagnosis not present

## 2022-03-04 DIAGNOSIS — H35033 Hypertensive retinopathy, bilateral: Secondary | ICD-10-CM

## 2022-03-04 DIAGNOSIS — H353211 Exudative age-related macular degeneration, right eye, with active choroidal neovascularization: Secondary | ICD-10-CM | POA: Diagnosis not present

## 2022-03-04 DIAGNOSIS — H43813 Vitreous degeneration, bilateral: Secondary | ICD-10-CM | POA: Diagnosis not present

## 2022-03-04 DIAGNOSIS — I10 Essential (primary) hypertension: Secondary | ICD-10-CM

## 2022-03-04 DIAGNOSIS — H353122 Nonexudative age-related macular degeneration, left eye, intermediate dry stage: Secondary | ICD-10-CM | POA: Diagnosis not present

## 2022-03-06 ENCOUNTER — Encounter: Payer: Self-pay | Admitting: Podiatry

## 2022-03-06 ENCOUNTER — Ambulatory Visit: Payer: PPO | Admitting: Podiatry

## 2022-03-06 DIAGNOSIS — E1140 Type 2 diabetes mellitus with diabetic neuropathy, unspecified: Secondary | ICD-10-CM | POA: Diagnosis not present

## 2022-03-06 DIAGNOSIS — L97512 Non-pressure chronic ulcer of other part of right foot with fat layer exposed: Secondary | ICD-10-CM

## 2022-03-06 DIAGNOSIS — I999 Unspecified disorder of circulatory system: Secondary | ICD-10-CM | POA: Diagnosis not present

## 2022-03-12 NOTE — Progress Notes (Signed)
  Subjective:  Patient ID: Leah Walsh, female    DOB: Apr 07, 1941,  MRN: [id number]  Pain and swelling around callus right great toe  81 y.o. female presents with the above complaint. History confirmed with patient.  She is doing well.  She has finished her antibiotics.  She denies any other acute complaints.  Objective:  Physical Exam: warm, good capillary refill, no trophic changes or ulcerative lesions, faintly DP and PT pulses, and large medial hyperkeratosis medial right hallux, debridement of this reveals ulceration deep to it measuring 2 distinct areas today 0.4 x 0.2 x 0.2 cm and 0.3 x 0.3 x 0.3 cm proximal and dorsal to this, there is bloody drainage drainge as well.  No erythema today    Assessment:   1. Skin ulcer of toe of right foot with fat layer exposed ([place])      Plan:  Patient was evaluated and treated and all questions answered.  Ulcer right great toe -We discussed the etiology and factors that are a part of the wound healing process.  We also discussed the risk of infection both soft tissue and osteomyelitis from open ulceration.  Discussed the risk of limb loss if this happens or worsens. -Debridement as below. -Dressed with Iodosorb, DSD. -Continue home dressing changes daily with 4 x 4 gauze and mupirocin ointment -Vascular testing currently not indicated -No further erythema noted. -Continue surgical shoe -If not improved by next visit plan for MRI -ABI PVRs were ordered for this patient.  Procedure: Excisional Debridement of Wound Rationale: Removal of non-viable soft tissue from the wound to promote healing.  Anesthesia: none Post-Debridement Wound Measurements: 0.4 x 0.2 x 0.2 cm and 0.3 x 0.3 x 0.3 cm Type of Debridement: Sharp Excisional Tissue Removed: Non-viable soft tissue Depth of Debridement: subcutaneous tissue. Technique: Sharp excisional debridement to bleeding, viable wound base.  Dressing: Dry, sterile, compression dressing. Disposition:  Patient tolerated procedure well.           Return in about 3 weeks (around 03/27/2022) for [place] .

## 2022-03-21 ENCOUNTER — Other Ambulatory Visit: Payer: Self-pay

## 2022-03-21 NOTE — Patient Outreach (Signed)
  Care Management   Follow Up Note   03/21/2022 Name: Leah Walsh MRN: [id number] DOB: 07/20/1941   Referred by: [name], MD Reason for referral : No chief complaint on file.   An unsuccessful telephone outreach was attempted today. The patient was referred to the case management team for assistance with care management and care coordination.   Follow Up Plan:  Care coordination team will call patient back at a later date.  Tomasa Rand, RN, BSN, CEN [facility] [address] [employer] [phone]

## 2022-03-25 ENCOUNTER — Ambulatory Visit: Payer: PPO | Admitting: Podiatry

## 2022-03-25 DIAGNOSIS — E1122 Type 2 diabetes mellitus with diabetic chronic kidney disease: Secondary | ICD-10-CM | POA: Diagnosis not present

## 2022-03-25 DIAGNOSIS — L97512 Non-pressure chronic ulcer of other part of right foot with fat layer exposed: Secondary | ICD-10-CM | POA: Diagnosis not present

## 2022-03-25 DIAGNOSIS — I10 Essential (primary) hypertension: Secondary | ICD-10-CM | POA: Diagnosis not present

## 2022-03-25 DIAGNOSIS — E7800 Pure hypercholesterolemia, unspecified: Secondary | ICD-10-CM | POA: Diagnosis not present

## 2022-03-25 DIAGNOSIS — Z992 Dependence on renal dialysis: Secondary | ICD-10-CM | POA: Diagnosis not present

## 2022-03-25 DIAGNOSIS — N186 End stage renal disease: Secondary | ICD-10-CM | POA: Diagnosis not present

## 2022-03-25 NOTE — Progress Notes (Signed)
  Subjective:  Patient ID: Leah Walsh, female    DOB: 18-Nov-1940,  MRN: [id number]  Pain and swelling around callus right great toe  81 y.o. female presents with the above complaint. History confirmed with patient.  Says it has not improved much  Objective:  Physical Exam: warm, good capillary refill, no trophic changes or ulcerative lesions, normal DP and PT pulses, and large medial hyperkeratosis medial right hallux, debridement of this reveals ulceration deep to it 0.6 x 0.3 x 0.2, there is bloody drainage drainge as well.  No signs of infection        Assessment:   1. Skin ulcer of toe of right foot with fat layer exposed ([place])      Plan:  Patient was evaluated and treated and all questions answered.  Ulcer right great toe -We discussed the etiology and factors that are a part of the wound healing process.  We also discussed the risk of infection both soft tissue and osteomyelitis from open ulceration.  Discussed the risk of limb loss if this happens or worsens. -Debridement as below. -Dressed with Iodosorb, DSD. -Continue home dressing changes daily with 4 x 4 gauze and mupirocin ointment -She will call to schedule her circulation testing for ABIs -Offload with surgical shoe which was dispensed today as well -Has not had significant improvement.  I recommend MRI to evaluate for any deeper chronic infection that is not visible on her plain film x-rays.  We also discussed possible surgical invention with reduction of any prominent bone and/or corrective osteotomies but need to know if there is infection in this prior to proceeding  Procedure: Excisional Debridement of Wound Rationale: Removal of non-viable soft tissue from the wound to promote healing.  Anesthesia: none Post-Debridement Wound Measurements: 0.6 x 0.3 x 0.2 Type of Debridement: Sharp Excisional Tissue Removed: Non-viable soft tissue Depth of Debridement: subcutaneous tissue. Technique: Sharp excisional  debridement to bleeding, viable wound base.  Dressing: Dry, sterile, compression dressing. Disposition: Patient tolerated procedure well.           Return in about 3 weeks (around 04/15/2022) for wound care.

## 2022-03-25 NOTE — Patient Instructions (Addendum)
Call [facility] to schedule your MRI at the below locations.  Please allow at least 1 business day after your visit to process the referral.  It may take longer depending on approval from insurance.  Please let me know if you have issues or problems scheduling the MRI    [facility] [phone] [address], [place] [address]    Call Phone: [phone] to schedule your circulation testing

## 2022-03-26 ENCOUNTER — Ambulatory Visit: Payer: PPO | Admitting: Podiatry

## 2022-03-26 DIAGNOSIS — N186 End stage renal disease: Secondary | ICD-10-CM | POA: Diagnosis not present

## 2022-03-26 DIAGNOSIS — D509 Iron deficiency anemia, unspecified: Secondary | ICD-10-CM | POA: Diagnosis not present

## 2022-03-26 DIAGNOSIS — D631 Anemia in chronic kidney disease: Secondary | ICD-10-CM | POA: Diagnosis not present

## 2022-03-26 DIAGNOSIS — N2581 Secondary hyperparathyroidism of renal origin: Secondary | ICD-10-CM | POA: Diagnosis not present

## 2022-03-27 ENCOUNTER — Ambulatory Visit (HOSPITAL_COMMUNITY)
Admission: RE | Admit: 2022-03-27 | Discharge: 2022-03-27 | Disposition: A | Discharge status: Home / Self Care | Payer: PPO | Source: Ambulatory Visit | Attending: Vascular Surgery | Admitting: Vascular Surgery

## 2022-03-27 ENCOUNTER — Other Ambulatory Visit: Payer: Self-pay

## 2022-03-27 DIAGNOSIS — L97512 Non-pressure chronic ulcer of other part of right foot with fat layer exposed: Secondary | ICD-10-CM | POA: Diagnosis not present

## 2022-03-27 NOTE — Patient Outreach (Signed)
  Care Management   Follow Up Note   03/27/2022 Name: Leah Walsh MRN: [id number] DOB: 1941-08-12   Referred by: [name], MD Reason for referral : Care Coordination (/)   A second unsuccessful telephone outreach was attempted today. The patient was referred to the case management team for assistance with care management and care coordination.   Follow Up Plan: The care management team will reach out to the patient again over the next 7 days.   Tomasa Rand, RN, BSN, CEN [facility] [employer] [phone]

## 2022-03-28 DIAGNOSIS — E1122 Type 2 diabetes mellitus with diabetic chronic kidney disease: Secondary | ICD-10-CM | POA: Diagnosis not present

## 2022-03-29 DIAGNOSIS — D225 Melanocytic nevi of trunk: Secondary | ICD-10-CM | POA: Diagnosis not present

## 2022-03-29 DIAGNOSIS — L821 Other seborrheic keratosis: Secondary | ICD-10-CM | POA: Diagnosis not present

## 2022-03-29 DIAGNOSIS — L739 Follicular disorder, unspecified: Secondary | ICD-10-CM | POA: Diagnosis not present

## 2022-03-29 DIAGNOSIS — D2239 Melanocytic nevi of other parts of face: Secondary | ICD-10-CM | POA: Diagnosis not present

## 2022-03-31 ENCOUNTER — Ambulatory Visit
Admission: RE | Admit: 2022-03-31 | Discharge: 2022-03-31 | Disposition: A | Discharge status: Home / Self Care | Payer: PPO | Source: Ambulatory Visit | Attending: Podiatry | Admitting: Podiatry

## 2022-03-31 DIAGNOSIS — L97512 Non-pressure chronic ulcer of other part of right foot with fat layer exposed: Secondary | ICD-10-CM

## 2022-03-31 DIAGNOSIS — S91101A Unspecified open wound of right great toe without damage to nail, initial encounter: Secondary | ICD-10-CM | POA: Diagnosis not present

## 2022-04-01 ENCOUNTER — Encounter (INDEPENDENT_AMBULATORY_CARE_PROVIDER_SITE_OTHER): Payer: PPO | Admitting: Ophthalmology

## 2022-04-01 DIAGNOSIS — H34831 Tributary (branch) retinal vein occlusion, right eye, with macular edema: Secondary | ICD-10-CM | POA: Diagnosis not present

## 2022-04-01 DIAGNOSIS — H353122 Nonexudative age-related macular degeneration, left eye, intermediate dry stage: Secondary | ICD-10-CM | POA: Diagnosis not present

## 2022-04-01 DIAGNOSIS — H353211 Exudative age-related macular degeneration, right eye, with active choroidal neovascularization: Secondary | ICD-10-CM | POA: Diagnosis not present

## 2022-04-01 DIAGNOSIS — I10 Essential (primary) hypertension: Secondary | ICD-10-CM | POA: Diagnosis not present

## 2022-04-01 DIAGNOSIS — H35033 Hypertensive retinopathy, bilateral: Secondary | ICD-10-CM

## 2022-04-01 DIAGNOSIS — H43813 Vitreous degeneration, bilateral: Secondary | ICD-10-CM

## 2022-04-02 ENCOUNTER — Other Ambulatory Visit: Payer: Self-pay | Admitting: Podiatry

## 2022-04-02 ENCOUNTER — Other Ambulatory Visit: Payer: Self-pay

## 2022-04-02 NOTE — Patient Outreach (Signed)
  Care Coordination   04/02/2022 Name: Leah Walsh MRN: [id number] DOB: 04-May-1941   Care Coordination Outreach Attempts:  A third unsuccessful outreach was attempted today to offer the patient with information about available care coordination services as a benefit of their health plan.   Follow Up Plan:  No further outreach attempts will be made at this time. We have been unable to contact the patient to offer or enroll patient in care coordination services  Encounter Outcome:  No Answer  Care Coordination Interventions Activated:  No   Care Coordination Interventions:  No, not indicated    Tomasa Rand, RN, BSN, CEN [place] Coordinator [phone]

## 2022-04-03 ENCOUNTER — Other Ambulatory Visit: Payer: PPO

## 2022-04-03 ENCOUNTER — Telehealth: Payer: Self-pay | Admitting: Podiatry

## 2022-04-03 NOTE — Telephone Encounter (Signed)
Patient calling to follow up on Vascular results and MRI  results, she seen them in mychart but doesn't understand what its talking about.

## 2022-04-08 DIAGNOSIS — K649 Unspecified hemorrhoids: Secondary | ICD-10-CM | POA: Diagnosis not present

## 2022-04-22 ENCOUNTER — Ambulatory Visit: Payer: PPO | Admitting: Podiatry

## 2022-04-22 DIAGNOSIS — L97512 Non-pressure chronic ulcer of other part of right foot with fat layer exposed: Secondary | ICD-10-CM | POA: Diagnosis not present

## 2022-04-22 NOTE — Progress Notes (Signed)
  Subjective:  Patient ID: Leah Walsh, female    DOB: [birth year],  MRN: [id number]  Pain and swelling around callus right great toe  81 y.o. female presents with the above complaint. History confirmed with patient.  She can lead to the MRI  Objective:  Physical Exam: warm, good capillary refill, no trophic changes or ulcerative lesions, normal DP and PT pulses, and large medial hyperkeratosis medial right hallux, debridement of this reveals ulceration deep to it 0.6 x 0.4 x 0.2, there is bloody drainage drainge as well.  No signs of infection        IMPRESSION: 1. No evidence of osteomyelitis.  No abscess. 2. Chronic avascular necrosis of the second metatarsal head with mild subchondral collapse. Secondary second MTP joint osteoarthritis. 3. Mild-to-moderate hallux valgus deformity with associated first MTP joint osteoarthritis.     Electronically Signed   By: Titus Dubin M.D.   On: 04/01/2022 14:03   Assessment:   1. Skin ulcer of toe of right foot with fat layer exposed ([place])      Plan:  Patient was evaluated and treated and all questions answered.  Ulcer right great toe -We discussed the etiology and factors that are a part of the wound healing process.  We also discussed the risk of infection both soft tissue and osteomyelitis from open ulceration.  Discussed the risk of limb loss if this happens or worsens. -Debridement as below. -Dressed with Iodosorb, DSD. -Continue home dressing changes daily with 4 x 4 gauze and Prisma collagen matrix leave every 2 to 3 days -ABIs show no significant abnormalities -Offload with surgical shoe which was dispensed today as well -MRI reviewed.  No signs of infection.  Procedure: Excisional Debridement of Wound Rationale: Removal of non-viable soft tissue from the wound to promote healing.  Anesthesia: none Post-Debridement Wound Measurements: 0.6 x 0.4 x 0.2 Type of Debridement: Sharp Excisional Tissue Removed: Non-viable  soft tissue Depth of Debridement: subcutaneous tissue. Technique: Sharp excisional debridement to bleeding, viable wound base.  Dressing: Dry, sterile, compression dressing. Disposition: Patient tolerated procedure well.           Return in about 4 weeks (around 05/20/2022) for wound care.

## 2022-04-25 DIAGNOSIS — Z992 Dependence on renal dialysis: Secondary | ICD-10-CM | POA: Diagnosis not present

## 2022-04-25 DIAGNOSIS — N186 End stage renal disease: Secondary | ICD-10-CM | POA: Diagnosis not present

## 2022-04-25 DIAGNOSIS — E7800 Pure hypercholesterolemia, unspecified: Secondary | ICD-10-CM | POA: Diagnosis not present

## 2022-04-25 DIAGNOSIS — I10 Essential (primary) hypertension: Secondary | ICD-10-CM | POA: Diagnosis not present

## 2022-04-25 DIAGNOSIS — E1122 Type 2 diabetes mellitus with diabetic chronic kidney disease: Secondary | ICD-10-CM | POA: Diagnosis not present

## 2022-04-27 DIAGNOSIS — D631 Anemia in chronic kidney disease: Secondary | ICD-10-CM | POA: Diagnosis not present

## 2022-04-27 DIAGNOSIS — Z23 Encounter for immunization: Secondary | ICD-10-CM | POA: Diagnosis not present

## 2022-04-27 DIAGNOSIS — N186 End stage renal disease: Secondary | ICD-10-CM | POA: Diagnosis not present

## 2022-04-27 DIAGNOSIS — N2581 Secondary hyperparathyroidism of renal origin: Secondary | ICD-10-CM | POA: Diagnosis not present

## 2022-04-27 DIAGNOSIS — D509 Iron deficiency anemia, unspecified: Secondary | ICD-10-CM | POA: Diagnosis not present

## 2022-05-01 ENCOUNTER — Encounter (INDEPENDENT_AMBULATORY_CARE_PROVIDER_SITE_OTHER): Payer: PPO | Admitting: Ophthalmology

## 2022-05-01 DIAGNOSIS — H353122 Nonexudative age-related macular degeneration, left eye, intermediate dry stage: Secondary | ICD-10-CM | POA: Diagnosis not present

## 2022-05-01 DIAGNOSIS — H34831 Tributary (branch) retinal vein occlusion, right eye, with macular edema: Secondary | ICD-10-CM

## 2022-05-01 DIAGNOSIS — H353211 Exudative age-related macular degeneration, right eye, with active choroidal neovascularization: Secondary | ICD-10-CM | POA: Diagnosis not present

## 2022-05-01 DIAGNOSIS — H43813 Vitreous degeneration, bilateral: Secondary | ICD-10-CM

## 2022-05-01 DIAGNOSIS — H35033 Hypertensive retinopathy, bilateral: Secondary | ICD-10-CM

## 2022-05-01 DIAGNOSIS — I10 Essential (primary) hypertension: Secondary | ICD-10-CM

## 2022-05-02 DIAGNOSIS — E1122 Type 2 diabetes mellitus with diabetic chronic kidney disease: Secondary | ICD-10-CM | POA: Diagnosis not present

## 2022-05-20 ENCOUNTER — Encounter: Payer: Self-pay | Admitting: Podiatry

## 2022-05-20 ENCOUNTER — Ambulatory Visit: Payer: PPO | Admitting: Podiatry

## 2022-05-20 ENCOUNTER — Ambulatory Visit (INDEPENDENT_AMBULATORY_CARE_PROVIDER_SITE_OTHER): Payer: PPO

## 2022-05-20 DIAGNOSIS — L97512 Non-pressure chronic ulcer of other part of right foot with fat layer exposed: Secondary | ICD-10-CM

## 2022-05-20 NOTE — Progress Notes (Signed)
  Subjective:  Patient ID: Leah Walsh, female    DOB: 17-Jun-1941,  MRN: [id number]  Pain and swelling around callus right great toe  81 y.o. female presents with the above complaint. History confirmed with patient.  Says has been painful for her recently  Objective:  Physical Exam: warm, good capillary refill, no trophic changes or ulcerative lesions, normal DP and PT pulses, and large medial hyperkeratosis medial right hallux, debridement of this reveals ulceration deep to it 0.4 x 0.4 x 0.2, there is bloody drainage drainge as well.  No signs of infection         IMPRESSION: 1. No evidence of osteomyelitis.  No abscess. 2. Chronic avascular necrosis of the second metatarsal head with mild subchondral collapse. Secondary second MTP joint osteoarthritis. 3. Mild-to-moderate hallux valgus deformity with associated first MTP joint osteoarthritis.     Electronically Signed   By: Titus Dubin M.D.   On: 04/01/2022 14:03   New radiographs taken today show plantar medial bony eminence deep to the ulcer site that is noted with a skin marker   Assessment:   1. Skin ulcer of toe of right foot with fat layer exposed ([place])      Plan:  Patient was evaluated and treated and all questions answered.  Ulcer right great toe -We discussed the etiology and factors that are a part of the wound healing process.  We also discussed the risk of infection both soft tissue and osteomyelitis from open ulceration.  Discussed the risk of limb loss if this happens or worsens. -Debridement as below. -Dressed with Iodosorb, DSD. -Continue home dressing changes daily with 4 x 4 gauze and Prisma collagen matrix leave every 2 to 3 days -We can discuss continuing her further wound care.  Discussed that she has not made much progress so far.  I do think there is a large prominent exostosis that is present deep to the ulceration is noted on her radiographs today.  We discussed surgical resection and  excision of this through minimally invasive approach.  I do think her bunion deformity is a large contributing factor as well but addressing this would be a much larger undertaking.  She will consider these risks that we discussed today including the risk of infection.  She would also require cardiac clearance prior to surgery.  We will consider it at her next visit again  Procedure: Excisional Debridement of Wound Rationale: Removal of non-viable soft tissue from the wound to promote healing.  Anesthesia: none Post-Debridement Wound Measurements: 0.4 x 0.4 x 0.2 Type of Debridement: Sharp Excisional Tissue Removed: Non-viable soft tissue Depth of Debridement: subcutaneous tissue. Technique: Sharp excisional debridement to bleeding, viable wound base.  Dressing: Dry, sterile, compression dressing. Disposition: Patient tolerated procedure well.           Return in about 3 weeks (around 06/10/2022) for wound care.

## 2022-05-24 DIAGNOSIS — K644 Residual hemorrhoidal skin tags: Secondary | ICD-10-CM | POA: Diagnosis not present

## 2022-05-24 DIAGNOSIS — I509 Heart failure, unspecified: Secondary | ICD-10-CM | POA: Diagnosis not present

## 2022-05-24 DIAGNOSIS — D126 Benign neoplasm of colon, unspecified: Secondary | ICD-10-CM | POA: Diagnosis not present

## 2022-05-24 DIAGNOSIS — K635 Polyp of colon: Secondary | ICD-10-CM | POA: Diagnosis not present

## 2022-05-24 DIAGNOSIS — K5730 Diverticulosis of large intestine without perforation or abscess without bleeding: Secondary | ICD-10-CM | POA: Diagnosis not present

## 2022-05-24 DIAGNOSIS — I110 Hypertensive heart disease with heart failure: Secondary | ICD-10-CM | POA: Diagnosis not present

## 2022-05-24 DIAGNOSIS — K648 Other hemorrhoids: Secondary | ICD-10-CM | POA: Diagnosis not present

## 2022-05-24 DIAGNOSIS — K921 Melena: Secondary | ICD-10-CM | POA: Diagnosis not present

## 2022-05-24 DIAGNOSIS — D122 Benign neoplasm of ascending colon: Secondary | ICD-10-CM | POA: Diagnosis not present

## 2022-05-24 DIAGNOSIS — K5732 Diverticulitis of large intestine without perforation or abscess without bleeding: Secondary | ICD-10-CM | POA: Diagnosis not present

## 2022-05-24 DIAGNOSIS — Z86010 Personal history of colonic polyps: Secondary | ICD-10-CM | POA: Diagnosis not present

## 2022-05-24 DIAGNOSIS — Z1211 Encounter for screening for malignant neoplasm of colon: Secondary | ICD-10-CM | POA: Diagnosis not present

## 2022-05-24 DIAGNOSIS — N189 Chronic kidney disease, unspecified: Secondary | ICD-10-CM | POA: Diagnosis not present

## 2022-05-24 DIAGNOSIS — I129 Hypertensive chronic kidney disease with stage 1 through stage 4 chronic kidney disease, or unspecified chronic kidney disease: Secondary | ICD-10-CM | POA: Diagnosis not present

## 2022-05-25 DIAGNOSIS — E1122 Type 2 diabetes mellitus with diabetic chronic kidney disease: Secondary | ICD-10-CM | POA: Diagnosis not present

## 2022-05-25 DIAGNOSIS — N186 End stage renal disease: Secondary | ICD-10-CM | POA: Diagnosis not present

## 2022-05-25 DIAGNOSIS — I10 Essential (primary) hypertension: Secondary | ICD-10-CM | POA: Diagnosis not present

## 2022-05-25 DIAGNOSIS — Z992 Dependence on renal dialysis: Secondary | ICD-10-CM | POA: Diagnosis not present

## 2022-05-25 DIAGNOSIS — E7800 Pure hypercholesterolemia, unspecified: Secondary | ICD-10-CM | POA: Diagnosis not present

## 2022-05-28 DIAGNOSIS — U071 COVID-19: Secondary | ICD-10-CM | POA: Diagnosis not present

## 2022-05-28 DIAGNOSIS — D631 Anemia in chronic kidney disease: Secondary | ICD-10-CM | POA: Diagnosis not present

## 2022-05-28 DIAGNOSIS — N2581 Secondary hyperparathyroidism of renal origin: Secondary | ICD-10-CM | POA: Diagnosis not present

## 2022-05-28 DIAGNOSIS — D509 Iron deficiency anemia, unspecified: Secondary | ICD-10-CM | POA: Diagnosis not present

## 2022-05-28 DIAGNOSIS — N186 End stage renal disease: Secondary | ICD-10-CM | POA: Diagnosis not present

## 2022-05-30 DIAGNOSIS — E1122 Type 2 diabetes mellitus with diabetic chronic kidney disease: Secondary | ICD-10-CM | POA: Diagnosis not present

## 2022-06-03 ENCOUNTER — Encounter (INDEPENDENT_AMBULATORY_CARE_PROVIDER_SITE_OTHER): Payer: PPO | Admitting: Ophthalmology

## 2022-06-03 DIAGNOSIS — I10 Essential (primary) hypertension: Secondary | ICD-10-CM | POA: Diagnosis not present

## 2022-06-03 DIAGNOSIS — H353211 Exudative age-related macular degeneration, right eye, with active choroidal neovascularization: Secondary | ICD-10-CM | POA: Diagnosis not present

## 2022-06-03 DIAGNOSIS — H43813 Vitreous degeneration, bilateral: Secondary | ICD-10-CM

## 2022-06-03 DIAGNOSIS — H34831 Tributary (branch) retinal vein occlusion, right eye, with macular edema: Secondary | ICD-10-CM | POA: Diagnosis not present

## 2022-06-03 DIAGNOSIS — H35033 Hypertensive retinopathy, bilateral: Secondary | ICD-10-CM | POA: Diagnosis not present

## 2022-06-03 DIAGNOSIS — H353122 Nonexudative age-related macular degeneration, left eye, intermediate dry stage: Secondary | ICD-10-CM

## 2022-06-05 ENCOUNTER — Telehealth: Payer: Self-pay | Admitting: *Deleted

## 2022-06-05 MED ORDER — DOXYCYCLINE HYCLATE 100 MG PO TABS: 100.0000 mg | ORAL_TABLET | Freq: Two times a day (BID) | ORAL | 0 refills | Status: DC

## 2022-06-05 NOTE — Telephone Encounter (Signed)
Patient is calling because her right great toe has a wound that is ,swollen,not looking good, can an antibiotic be called in?

## 2022-06-07 NOTE — Telephone Encounter (Signed)
Called , no answer, could not leave a voice message.

## 2022-06-17 ENCOUNTER — Telehealth: Payer: Self-pay

## 2022-06-17 ENCOUNTER — Ambulatory Visit: Payer: PPO | Admitting: Podiatry

## 2022-06-17 DIAGNOSIS — L97512 Non-pressure chronic ulcer of other part of right foot with fat layer exposed: Secondary | ICD-10-CM | POA: Diagnosis not present

## 2022-06-17 NOTE — Progress Notes (Signed)
  Subjective:  Patient ID: Leah Walsh, female    DOB: 12-Jun-1941,  MRN: [id number]  Pain and swelling around callus right great toe  81 y.o. female presents with the above complaint. History confirmed with patient.  Her husband is present here today with her.  He says after he cleans that he is trimmed away some of the dead skin.  She has not been wearing the surgical shoe  Objective:  Physical Exam: warm, good capillary refill, no trophic changes or ulcerative lesions, normal DP and PT pulses, and large medial hyperkeratosis medial right hallux, debridement of this reveals ulceration deep to it 1.3 x 0.8 x 0.5, there is bloody drainage drainge as well.  No signs of infection          IMPRESSION: 1. No evidence of osteomyelitis.  No abscess. 2. Chronic avascular necrosis of the second metatarsal head with mild subchondral collapse. Secondary second MTP joint osteoarthritis. 3. Mild-to-moderate hallux valgus deformity with associated first MTP joint osteoarthritis.     Electronically Signed   By: Titus Dubin M.D.   On: 04/01/2022 14:03   New radiographs taken today show plantar medial bony eminence deep to the ulcer site that is noted with a skin marker   Assessment:   1. Skin ulcer of toe of right foot with fat layer exposed ([place])      Plan:  Patient was evaluated and treated and all questions answered.  Ulcer right great toe -We discussed the etiology and factors that are a part of the wound healing process.  We also discussed the risk of infection both soft tissue and osteomyelitis from open ulceration.  Discussed the risk of limb loss if this happens or worsens. -Debridement as below. -Dressed with Iodosorb, DSD. -Continue home dressing changes daily with 4 x 4 gauze and Prisma collagen matrix leave every 2 to 3 days -We again discussed surgical treatment.  I discussed this with her husband who is present today.  We also discussed getting a second opinion at  the wound care center and they would like to do this at the [facility] wound center in [place].  I do think he likely has been reading tissue he should not be at home and I advised him against this today.  Also discussed with him that offloading continues to be important that I would recommend she remain in the surgical shoe, her regular shoe gear likely will not be conducive to wound healing.  Wound care referral sent  Procedure: Excisional Debridement of Wound Rationale: Removal of non-viable soft tissue from the wound to promote healing.  Anesthesia: none Post-Debridement Wound Measurements: 1.0 x 0.8 x 0.5 cm Type of Debridement: Sharp Excisional Tissue Removed: Non-viable soft tissue Depth of Debridement: subcutaneous tissue. Technique: Sharp excisional debridement to bleeding, viable wound base.  Dressing: Dry, sterile, compression dressing. Disposition: Patient tolerated procedure well.           Return in about 3 weeks (around 07/08/2022) for wound care.

## 2022-06-17 NOTE — Telephone Encounter (Signed)
Referral, order sheet, demographics and office note faxed to [facility] for second opinion on nonhealing diabetic ulcer

## 2022-06-25 DIAGNOSIS — Z992 Dependence on renal dialysis: Secondary | ICD-10-CM | POA: Diagnosis not present

## 2022-06-25 DIAGNOSIS — I10 Essential (primary) hypertension: Secondary | ICD-10-CM | POA: Diagnosis not present

## 2022-06-25 DIAGNOSIS — E7800 Pure hypercholesterolemia, unspecified: Secondary | ICD-10-CM | POA: Diagnosis not present

## 2022-06-25 DIAGNOSIS — E1122 Type 2 diabetes mellitus with diabetic chronic kidney disease: Secondary | ICD-10-CM | POA: Diagnosis not present

## 2022-06-25 DIAGNOSIS — N186 End stage renal disease: Secondary | ICD-10-CM | POA: Diagnosis not present

## 2022-06-27 DIAGNOSIS — D509 Iron deficiency anemia, unspecified: Secondary | ICD-10-CM | POA: Diagnosis not present

## 2022-06-27 DIAGNOSIS — E1122 Type 2 diabetes mellitus with diabetic chronic kidney disease: Secondary | ICD-10-CM | POA: Diagnosis not present

## 2022-06-27 DIAGNOSIS — N186 End stage renal disease: Secondary | ICD-10-CM | POA: Diagnosis not present

## 2022-06-27 DIAGNOSIS — N2581 Secondary hyperparathyroidism of renal origin: Secondary | ICD-10-CM | POA: Diagnosis not present

## 2022-06-27 DIAGNOSIS — D631 Anemia in chronic kidney disease: Secondary | ICD-10-CM | POA: Diagnosis not present

## 2022-07-01 ENCOUNTER — Ambulatory Visit: Payer: PPO | Admitting: Podiatry

## 2022-07-01 DIAGNOSIS — L97512 Non-pressure chronic ulcer of other part of right foot with fat layer exposed: Secondary | ICD-10-CM

## 2022-07-01 DIAGNOSIS — E1140 Type 2 diabetes mellitus with diabetic neuropathy, unspecified: Secondary | ICD-10-CM

## 2022-07-01 NOTE — Progress Notes (Signed)
  Subjective:  Patient ID: Leah Walsh, female    DOB: 1941-06-29,  MRN: [id number]  Pain and swelling around callus right great toe  81 y.o. female presents with the above complaint. History confirmed with patient.  She returns for follow-up she has not heard from the wound care center Objective:  Physical Exam: warm, good capillary refill, no trophic changes or ulcerative lesions, normal DP and PT pulses, and large medial hyperkeratosis medial right hallux, debridement of this reveals ulceration deep to it 0.7 x 0.8 x 0.3, there is bloody drainage drainge as well.  No signs of infection          IMPRESSION: 1. No evidence of osteomyelitis.  No abscess. 2. Chronic avascular necrosis of the second metatarsal head with mild subchondral collapse. Secondary second MTP joint osteoarthritis. 3. Mild-to-moderate hallux valgus deformity with associated first MTP joint osteoarthritis.     Electronically Signed   By: Titus Dubin M.D.   On: 04/01/2022 14:03   New radiographs taken today show plantar medial bony eminence deep to the ulcer site that is noted with a skin marker   Assessment:   1. Skin ulcer of toe of right foot with fat layer exposed ([place])   2. Type 2 diabetes mellitus with diabetic neuropathy, unspecified whether long term insulin use ([place])      Plan:  Patient was evaluated and treated and all questions answered.  Ulcer right great toe -We discussed the etiology and factors that are a part of the wound healing process.  We also discussed the risk of infection both soft tissue and osteomyelitis from open ulceration.  Discussed the risk of limb loss if this happens or worsens. -Debridement as below. -Dressed with Iodosorb, DSD. -Continue home dressing changes daily with 4 x 4 gauze and Prisma collagen matrix leave every 2 to 3 days -I do still think would be beneficial to follow-up with the wound care center for a second opinion on further treatment, we still  may consider exostectomy  Procedure: Excisional Debridement of Wound Rationale: Removal of non-viable soft tissue from the wound to promote healing.  Anesthesia: none Post-Debridement Wound Measurements: 0.7 x 0.8 x 0.3 cm Type of Debridement: Sharp Excisional Tissue Removed: Non-viable soft tissue Depth of Debridement: subcutaneous tissue. Technique: Sharp excisional debridement to bleeding, viable wound base.  Dressing: Dry, sterile, compression dressing. Disposition: Patient tolerated procedure well.           Return in about 4 weeks (around 07/29/2022) for wound care.

## 2022-07-02 NOTE — Progress Notes (Signed)
It was faxed over to the wrong fax number. The fax number for them is [phone]. Everything was re-faxed yesterday on 07/02/22

## 2022-07-03 ENCOUNTER — Encounter (INDEPENDENT_AMBULATORY_CARE_PROVIDER_SITE_OTHER): Payer: PPO | Admitting: Ophthalmology

## 2022-07-03 DIAGNOSIS — I10 Essential (primary) hypertension: Secondary | ICD-10-CM | POA: Diagnosis not present

## 2022-07-03 DIAGNOSIS — H353211 Exudative age-related macular degeneration, right eye, with active choroidal neovascularization: Secondary | ICD-10-CM

## 2022-07-03 DIAGNOSIS — H353122 Nonexudative age-related macular degeneration, left eye, intermediate dry stage: Secondary | ICD-10-CM | POA: Diagnosis not present

## 2022-07-03 DIAGNOSIS — H43813 Vitreous degeneration, bilateral: Secondary | ICD-10-CM | POA: Diagnosis not present

## 2022-07-03 DIAGNOSIS — H34831 Tributary (branch) retinal vein occlusion, right eye, with macular edema: Secondary | ICD-10-CM | POA: Diagnosis not present

## 2022-07-03 DIAGNOSIS — H35033 Hypertensive retinopathy, bilateral: Secondary | ICD-10-CM

## 2022-07-16 DIAGNOSIS — L97512 Non-pressure chronic ulcer of other part of right foot with fat layer exposed: Secondary | ICD-10-CM | POA: Diagnosis not present

## 2022-07-16 DIAGNOSIS — M19071 Primary osteoarthritis, right ankle and foot: Secondary | ICD-10-CM | POA: Diagnosis not present

## 2022-07-16 DIAGNOSIS — L97519 Non-pressure chronic ulcer of other part of right foot with unspecified severity: Secondary | ICD-10-CM | POA: Diagnosis not present

## 2022-07-16 DIAGNOSIS — E11621 Type 2 diabetes mellitus with foot ulcer: Secondary | ICD-10-CM | POA: Diagnosis not present

## 2022-07-22 DIAGNOSIS — L97512 Non-pressure chronic ulcer of other part of right foot with fat layer exposed: Secondary | ICD-10-CM | POA: Diagnosis not present

## 2022-07-25 DIAGNOSIS — N186 End stage renal disease: Secondary | ICD-10-CM | POA: Diagnosis not present

## 2022-07-25 DIAGNOSIS — Z992 Dependence on renal dialysis: Secondary | ICD-10-CM | POA: Diagnosis not present

## 2022-07-27 DIAGNOSIS — N2581 Secondary hyperparathyroidism of renal origin: Secondary | ICD-10-CM | POA: Diagnosis not present

## 2022-07-27 DIAGNOSIS — D631 Anemia in chronic kidney disease: Secondary | ICD-10-CM | POA: Diagnosis not present

## 2022-07-27 DIAGNOSIS — N186 End stage renal disease: Secondary | ICD-10-CM | POA: Diagnosis not present

## 2022-07-27 DIAGNOSIS — D509 Iron deficiency anemia, unspecified: Secondary | ICD-10-CM | POA: Diagnosis not present

## 2022-07-29 ENCOUNTER — Ambulatory Visit: Payer: PPO | Admitting: Podiatry

## 2022-07-29 DIAGNOSIS — L97512 Non-pressure chronic ulcer of other part of right foot with fat layer exposed: Secondary | ICD-10-CM | POA: Diagnosis not present

## 2022-07-29 DIAGNOSIS — E11621 Type 2 diabetes mellitus with foot ulcer: Secondary | ICD-10-CM | POA: Diagnosis not present

## 2022-07-29 DIAGNOSIS — L97519 Non-pressure chronic ulcer of other part of right foot with unspecified severity: Secondary | ICD-10-CM | POA: Diagnosis not present

## 2022-08-01 DIAGNOSIS — E1122 Type 2 diabetes mellitus with diabetic chronic kidney disease: Secondary | ICD-10-CM | POA: Diagnosis not present

## 2022-08-05 DIAGNOSIS — L97512 Non-pressure chronic ulcer of other part of right foot with fat layer exposed: Secondary | ICD-10-CM | POA: Diagnosis not present

## 2022-08-07 ENCOUNTER — Encounter (INDEPENDENT_AMBULATORY_CARE_PROVIDER_SITE_OTHER): Payer: PPO | Admitting: Ophthalmology

## 2022-08-07 DIAGNOSIS — H35033 Hypertensive retinopathy, bilateral: Secondary | ICD-10-CM

## 2022-08-07 DIAGNOSIS — H43813 Vitreous degeneration, bilateral: Secondary | ICD-10-CM | POA: Diagnosis not present

## 2022-08-07 DIAGNOSIS — H34831 Tributary (branch) retinal vein occlusion, right eye, with macular edema: Secondary | ICD-10-CM | POA: Diagnosis not present

## 2022-08-07 DIAGNOSIS — I10 Essential (primary) hypertension: Secondary | ICD-10-CM

## 2022-08-07 DIAGNOSIS — H353132 Nonexudative age-related macular degeneration, bilateral, intermediate dry stage: Secondary | ICD-10-CM

## 2022-08-07 DIAGNOSIS — H353211 Exudative age-related macular degeneration, right eye, with active choroidal neovascularization: Secondary | ICD-10-CM | POA: Diagnosis not present

## 2022-08-14 DIAGNOSIS — Z6828 Body mass index (BMI) 28.0-28.9, adult: Secondary | ICD-10-CM | POA: Diagnosis not present

## 2022-08-14 DIAGNOSIS — M109 Gout, unspecified: Secondary | ICD-10-CM | POA: Diagnosis not present

## 2022-08-14 DIAGNOSIS — E559 Vitamin D deficiency, unspecified: Secondary | ICD-10-CM | POA: Diagnosis not present

## 2022-08-14 DIAGNOSIS — E1122 Type 2 diabetes mellitus with diabetic chronic kidney disease: Secondary | ICD-10-CM | POA: Diagnosis not present

## 2022-08-25 DIAGNOSIS — Z992 Dependence on renal dialysis: Secondary | ICD-10-CM | POA: Diagnosis not present

## 2022-08-25 DIAGNOSIS — N186 End stage renal disease: Secondary | ICD-10-CM | POA: Diagnosis not present

## 2022-08-26 DIAGNOSIS — J101 Influenza due to other identified influenza virus with other respiratory manifestations: Secondary | ICD-10-CM | POA: Diagnosis not present

## 2022-08-26 DIAGNOSIS — R509 Fever, unspecified: Secondary | ICD-10-CM | POA: Diagnosis not present

## 2022-08-29 DIAGNOSIS — I10 Essential (primary) hypertension: Secondary | ICD-10-CM | POA: Diagnosis not present

## 2022-08-29 DIAGNOSIS — N2581 Secondary hyperparathyroidism of renal origin: Secondary | ICD-10-CM | POA: Diagnosis not present

## 2022-08-29 DIAGNOSIS — E1122 Type 2 diabetes mellitus with diabetic chronic kidney disease: Secondary | ICD-10-CM | POA: Diagnosis not present

## 2022-08-29 DIAGNOSIS — Z1159 Encounter for screening for other viral diseases: Secondary | ICD-10-CM | POA: Diagnosis not present

## 2022-08-29 DIAGNOSIS — D631 Anemia in chronic kidney disease: Secondary | ICD-10-CM | POA: Diagnosis not present

## 2022-08-29 DIAGNOSIS — D509 Iron deficiency anemia, unspecified: Secondary | ICD-10-CM | POA: Diagnosis not present

## 2022-08-29 DIAGNOSIS — N186 End stage renal disease: Secondary | ICD-10-CM | POA: Diagnosis not present

## 2022-09-04 ENCOUNTER — Ambulatory Visit: Payer: PPO | Admitting: Podiatry

## 2022-09-11 ENCOUNTER — Other Ambulatory Visit: Payer: Self-pay

## 2022-09-11 ENCOUNTER — Other Ambulatory Visit (HOSPITAL_COMMUNITY): Payer: Self-pay

## 2022-09-11 ENCOUNTER — Other Ambulatory Visit: Payer: Self-pay | Admitting: Internal Medicine

## 2022-09-11 ENCOUNTER — Telehealth: Payer: Self-pay | Admitting: Podiatry

## 2022-09-11 ENCOUNTER — Ambulatory Visit: Payer: PPO | Admitting: Podiatry

## 2022-09-11 ENCOUNTER — Telehealth: Payer: Self-pay | Admitting: Internal Medicine

## 2022-09-11 ENCOUNTER — Ambulatory Visit (INDEPENDENT_AMBULATORY_CARE_PROVIDER_SITE_OTHER): Payer: PPO

## 2022-09-11 DIAGNOSIS — M869 Osteomyelitis, unspecified: Secondary | ICD-10-CM

## 2022-09-11 DIAGNOSIS — L97512 Non-pressure chronic ulcer of other part of right foot with fat layer exposed: Secondary | ICD-10-CM

## 2022-09-11 MED ORDER — ALBUTEROL SULFATE HFA 108 (90 BASE) MCG/ACT IN AERS: 2.0000 | INHALATION_SPRAY | Freq: Four times a day (QID) | RESPIRATORY_TRACT | 6 refills | Status: DC | PRN

## 2022-09-11 MED ORDER — DOXYCYCLINE HYCLATE 100 MG PO TABS: 100.0000 mg | ORAL_TABLET | Freq: Two times a day (BID) | ORAL | 0 refills | Status: DC

## 2022-09-11 MED ORDER — BUDESONIDE-FORMOTEROL FUMARATE 80-4.5 MCG/ACT IN AERO: INHALATION_SPRAY | RESPIRATORY_TRACT | 6 refills | Status: DC

## 2022-09-11 NOTE — Telephone Encounter (Signed)
Spoke to patient advised symbicort and albuterol inhaler has been filled. Okay'd per Dr. Annamaria Boots. Nothing further needed

## 2022-09-11 NOTE — Telephone Encounter (Addendum)
DOS: 09/13/2022  [employer]  Amputation Toe MPJ Joint Hallux Rt ([id number])  Out-of-Pocket: $3,450  Authorization #: [id number] Authorization Valid: 09/13/2022 - 12/12/2022

## 2022-09-11 NOTE — Telephone Encounter (Signed)
[employer] seems to be a current covered alternative for this patient.

## 2022-09-11 NOTE — Progress Notes (Addendum)
  Subjective:  Patient ID: Leah Walsh, female    DOB: 1941-03-08,  MRN: [id number]  Pain and swelling around callus right great toe  82 y.o. female presents with the above complaint. History confirmed with patient.  She returns for follow-up after undergoing local wound care at the [facility] wound care center.  She had to reschedule her appointment with me for follow-up due to having the flu recently.  The toe has become increasingly red swollen Objective:  Physical Exam: warm, good capillary refill, no trophic changes or ulcerative lesions, normal DP and PT pulses, and large medial ulceration right hallux with necrotic tissue, serous drainage toe is edematous and erythematous to the level of the MPJ     IMPRESSION: 1. No evidence of osteomyelitis.  No abscess. 2. Chronic avascular necrosis of the second metatarsal head with mild subchondral collapse. Secondary second MTP joint osteoarthritis. 3. Mild-to-moderate hallux valgus deformity with associated first MTP joint osteoarthritis.     Electronically Signed   By: Titus Dubin M.D.   On: 04/01/2022 14:03   New radiographs taken today show marked cortical and cancellous obstruction of the proximal and distal phalanx   Assessment:   1. Skin ulcer of toe of right foot with fat layer exposed ([place])   2. Type 2 diabetes mellitus with diabetic neuropathy, unspecified whether long term insulin use ([place])      Plan:  Patient was evaluated and treated and all questions answered.  Osteomyelitis of right great toe with ulceration Unfortunately her ulceration and toe has continued to worsen and I suspect she has osteomyelitis today.  There is serous drainage and a culture of this was taken.  I placed her on doxycycline.  I do not think MRI is necessary at this point due to the significant amount of bone destruction noted on her x-rays and my clinical exam findings.  There is high likelihood of osteomyelitis.  I do not think that  antibiotics will be able to alleviate this at this point I have recommended amputation of the right hallux.  Due to her cardiopulmonary status and urgent nature of such a surgery we likely will do this under local anesthesia.  I discussed this with her as well as the risk benefits and potential complications that could arise from surgery.  She understands and wishes to proceed.  Surgery will be scheduled for this Friday.   Rx for doxycycline sent to pharmacy         Return in about 4 weeks (around 07/29/2022) for wound care.

## 2022-09-11 NOTE — Addendum Note (Signed)
Addended bySherryle Lis, Taija Mathias R on: 09/11/2022 02:19 PM   Modules accepted: Orders

## 2022-09-12 ENCOUNTER — Other Ambulatory Visit: Payer: Self-pay | Admitting: Internal Medicine

## 2022-09-12 MED ORDER — FLUTICASONE-SALMETEROL 100-50 MCG/ACT IN AEPB: INHALATION_SPRAY | RESPIRATORY_TRACT | 12 refills | Status: DC

## 2022-09-13 ENCOUNTER — Ambulatory Visit: Payer: PPO | Admitting: Internal Medicine

## 2022-09-13 ENCOUNTER — Other Ambulatory Visit: Payer: Self-pay | Admitting: Podiatry

## 2022-09-13 DIAGNOSIS — I96 Gangrene, not elsewhere classified: Secondary | ICD-10-CM | POA: Diagnosis not present

## 2022-09-13 DIAGNOSIS — M86471 Chronic osteomyelitis with draining sinus, right ankle and foot: Secondary | ICD-10-CM | POA: Diagnosis not present

## 2022-09-13 DIAGNOSIS — M86671 Other chronic osteomyelitis, right ankle and foot: Secondary | ICD-10-CM | POA: Diagnosis not present

## 2022-09-13 DIAGNOSIS — M86171 Other acute osteomyelitis, right ankle and foot: Secondary | ICD-10-CM | POA: Diagnosis not present

## 2022-09-13 MED ORDER — TRAMADOL HCL 50 MG PO TABS: 50.0000 mg | ORAL_TABLET | Freq: Four times a day (QID) | ORAL | 0 refills | Status: AC | PRN

## 2022-09-13 MED ORDER — ACETAMINOPHEN 500 MG PO TABS: 1000.0000 mg | ORAL_TABLET | Freq: Four times a day (QID) | ORAL | 0 refills | Status: AC | PRN

## 2022-09-13 NOTE — Progress Notes (Signed)
09/13/22 R hallux amputation

## 2022-09-15 LAB — WOUND CULTURE
MICRO NUMBER:: 14439122
SPECIMEN QUALITY:: ADEQUATE

## 2022-09-18 ENCOUNTER — Encounter (INDEPENDENT_AMBULATORY_CARE_PROVIDER_SITE_OTHER): Payer: PPO | Admitting: Ophthalmology

## 2022-09-19 ENCOUNTER — Ambulatory Visit: Payer: PPO | Admitting: Podiatry

## 2022-09-19 DIAGNOSIS — L97512 Non-pressure chronic ulcer of other part of right foot with fat layer exposed: Secondary | ICD-10-CM

## 2022-09-19 NOTE — Progress Notes (Signed)
  Subjective:  Patient ID: Leah Walsh, female    DOB: 10/20/1940,  MRN: [id number]  Chief Complaint  Patient presents with   Routine Post Op    POV #1 DOS 09/13/2022 RT GREAT TOE AMPUTATION     82 y.o. female returns for post-op check.  Doing well not having much pain  Review of Systems: Negative except as noted in the HPI. Denies N/V/F/Ch.   Objective:  There were no vitals filed for this visit. There is no height or weight on file to calculate BMI. Constitutional Well developed. Well nourished.  Vascular Foot warm and well perfused. Capillary refill normal to all digits.  Calf is soft and supple, no posterior calf or knee pain, negative Homans' sign  Neurologic Normal speech. Oriented to person, place, and time. Epicritic sensation to light touch grossly absent bilaterally.  Dermatologic Skin healing well, no signs of infection there is some delayed healing in the central lateral portion  Orthopedic: She has no tenderness to palpation noted about the surgical site.    Assessment:   1. Skin ulcer of toe of right foot with fat layer exposed ([place])    Plan:  Patient was evaluated and treated and all questions answered.  S/p foot surgery right -Progressing as expected post-operatively. -WBAT in surgical shoe -Plan to return in 2 weeks for suture removal -Change dressing every other day with Betadine to delayed area of healing  Return in about 2 weeks (around 10/03/2022) for post op (no x-rays), suture removal.

## 2022-09-20 ENCOUNTER — Telehealth: Payer: Self-pay | Admitting: Podiatry

## 2022-09-20 NOTE — Telephone Encounter (Signed)
Pt was seen in the office yesterday; she wants to know if she will another round of antibiotics. She will finish the Rx she has now today. Please advise.

## 2022-09-20 NOTE — Telephone Encounter (Signed)
Patient husband states that she has noticed that the patient is having some discoloration(Black) in her toe next to the one that was amputated and wanted to know if that was normal. No redness, swelling or drainage

## 2022-09-20 NOTE — Telephone Encounter (Signed)
The patients husband states that it looks bruised but not dried blood.

## 2022-09-24 DIAGNOSIS — M86471 Chronic osteomyelitis with draining sinus, right ankle and foot: Secondary | ICD-10-CM | POA: Diagnosis not present

## 2022-09-25 DIAGNOSIS — N186 End stage renal disease: Secondary | ICD-10-CM | POA: Diagnosis not present

## 2022-09-25 DIAGNOSIS — Z992 Dependence on renal dialysis: Secondary | ICD-10-CM | POA: Diagnosis not present

## 2022-09-26 DIAGNOSIS — D509 Iron deficiency anemia, unspecified: Secondary | ICD-10-CM | POA: Diagnosis not present

## 2022-09-26 DIAGNOSIS — D631 Anemia in chronic kidney disease: Secondary | ICD-10-CM | POA: Diagnosis not present

## 2022-09-26 DIAGNOSIS — N186 End stage renal disease: Secondary | ICD-10-CM | POA: Diagnosis not present

## 2022-09-26 DIAGNOSIS — N2581 Secondary hyperparathyroidism of renal origin: Secondary | ICD-10-CM | POA: Diagnosis not present

## 2022-10-01 DIAGNOSIS — R7989 Other specified abnormal findings of blood chemistry: Secondary | ICD-10-CM | POA: Diagnosis not present

## 2022-10-01 DIAGNOSIS — Z8673 Personal history of transient ischemic attack (TIA), and cerebral infarction without residual deficits: Secondary | ICD-10-CM | POA: Diagnosis not present

## 2022-10-01 DIAGNOSIS — W01198A Fall on same level from slipping, tripping and stumbling with subsequent striking against other object, initial encounter: Secondary | ICD-10-CM | POA: Diagnosis not present

## 2022-10-01 DIAGNOSIS — Z1159 Encounter for screening for other viral diseases: Secondary | ICD-10-CM | POA: Diagnosis not present

## 2022-10-01 DIAGNOSIS — R220 Localized swelling, mass and lump, head: Secondary | ICD-10-CM | POA: Diagnosis not present

## 2022-10-01 DIAGNOSIS — Z992 Dependence on renal dialysis: Secondary | ICD-10-CM | POA: Diagnosis not present

## 2022-10-01 DIAGNOSIS — Y998 Other external cause status: Secondary | ICD-10-CM | POA: Diagnosis not present

## 2022-10-01 DIAGNOSIS — S0003XA Contusion of scalp, initial encounter: Secondary | ICD-10-CM | POA: Diagnosis not present

## 2022-10-02 DIAGNOSIS — E1122 Type 2 diabetes mellitus with diabetic chronic kidney disease: Secondary | ICD-10-CM | POA: Diagnosis not present

## 2022-10-03 ENCOUNTER — Ambulatory Visit (INDEPENDENT_AMBULATORY_CARE_PROVIDER_SITE_OTHER): Payer: PPO | Admitting: *Deleted

## 2022-10-03 DIAGNOSIS — M869 Osteomyelitis, unspecified: Secondary | ICD-10-CM

## 2022-10-03 DIAGNOSIS — Z89411 Acquired absence of right great toe: Secondary | ICD-10-CM

## 2022-10-03 NOTE — Progress Notes (Signed)
Patient presents today for post op visit # 2, patient of Dr. Sherryle Lis.   POV #2 DOS 09/13/2022 RT GREAT TOE AMPUTATION    Sutures removed today. Patient states she is not having any pain and has been doing very well post operatively.  Recommended she start using neosporin and a bandaid through the day to loose up the scabbed areas and leave uncovered at night. We also discussed showering and shoe gear. Informed her she could shower, but not to do any soaking. She can start back in regular shoes when she feels comfortable. Also, I recommended she get set up with our diabetic shoe department and get measured for diabetic shoes and insoles with a toe filler to the right side. She was interested in this and I will have that department give her a call and set that up.    Reviewed icing and elevation. Patient will follow up with Dr. Sherryle Lis for POV# 2 in 3 weeks.

## 2022-10-12 DIAGNOSIS — Z1159 Encounter for screening for other viral diseases: Secondary | ICD-10-CM | POA: Diagnosis not present

## 2022-10-14 ENCOUNTER — Encounter (INDEPENDENT_AMBULATORY_CARE_PROVIDER_SITE_OTHER): Payer: PPO | Admitting: Ophthalmology

## 2022-10-14 DIAGNOSIS — H34831 Tributary (branch) retinal vein occlusion, right eye, with macular edema: Secondary | ICD-10-CM | POA: Diagnosis not present

## 2022-10-14 DIAGNOSIS — H353211 Exudative age-related macular degeneration, right eye, with active choroidal neovascularization: Secondary | ICD-10-CM

## 2022-10-14 DIAGNOSIS — E113392 Type 2 diabetes mellitus with moderate nonproliferative diabetic retinopathy without macular edema, left eye: Secondary | ICD-10-CM

## 2022-10-14 DIAGNOSIS — H43813 Vitreous degeneration, bilateral: Secondary | ICD-10-CM

## 2022-10-14 DIAGNOSIS — H35033 Hypertensive retinopathy, bilateral: Secondary | ICD-10-CM | POA: Diagnosis not present

## 2022-10-14 DIAGNOSIS — H353122 Nonexudative age-related macular degeneration, left eye, intermediate dry stage: Secondary | ICD-10-CM | POA: Diagnosis not present

## 2022-10-14 DIAGNOSIS — I10 Essential (primary) hypertension: Secondary | ICD-10-CM

## 2022-10-16 DIAGNOSIS — H353211 Exudative age-related macular degeneration, right eye, with active choroidal neovascularization: Secondary | ICD-10-CM | POA: Diagnosis not present

## 2022-10-16 DIAGNOSIS — E113312 Type 2 diabetes mellitus with moderate nonproliferative diabetic retinopathy with macular edema, left eye: Secondary | ICD-10-CM | POA: Diagnosis not present

## 2022-10-16 DIAGNOSIS — E113391 Type 2 diabetes mellitus with moderate nonproliferative diabetic retinopathy without macular edema, right eye: Secondary | ICD-10-CM | POA: Diagnosis not present

## 2022-10-16 DIAGNOSIS — H40013 Open angle with borderline findings, low risk, bilateral: Secondary | ICD-10-CM | POA: Diagnosis not present

## 2022-10-16 DIAGNOSIS — H524 Presbyopia: Secondary | ICD-10-CM | POA: Diagnosis not present

## 2022-10-16 DIAGNOSIS — H353122 Nonexudative age-related macular degeneration, left eye, intermediate dry stage: Secondary | ICD-10-CM | POA: Diagnosis not present

## 2022-10-24 ENCOUNTER — Ambulatory Visit (INDEPENDENT_AMBULATORY_CARE_PROVIDER_SITE_OTHER): Payer: PPO | Admitting: Podiatry

## 2022-10-24 ENCOUNTER — Ambulatory Visit (INDEPENDENT_AMBULATORY_CARE_PROVIDER_SITE_OTHER): Payer: PPO

## 2022-10-24 DIAGNOSIS — M869 Osteomyelitis, unspecified: Secondary | ICD-10-CM

## 2022-10-24 DIAGNOSIS — M2140 Flat foot [pes planus] (acquired), unspecified foot: Secondary | ICD-10-CM

## 2022-10-24 DIAGNOSIS — Z89411 Acquired absence of right great toe: Secondary | ICD-10-CM

## 2022-10-24 DIAGNOSIS — L97512 Non-pressure chronic ulcer of other part of right foot with fat layer exposed: Secondary | ICD-10-CM

## 2022-10-24 DIAGNOSIS — E1140 Type 2 diabetes mellitus with diabetic neuropathy, unspecified: Secondary | ICD-10-CM

## 2022-10-24 DIAGNOSIS — Z992 Dependence on renal dialysis: Secondary | ICD-10-CM | POA: Diagnosis not present

## 2022-10-24 DIAGNOSIS — E11621 Type 2 diabetes mellitus with foot ulcer: Secondary | ICD-10-CM | POA: Diagnosis not present

## 2022-10-24 DIAGNOSIS — N186 End stage renal disease: Secondary | ICD-10-CM | POA: Diagnosis not present

## 2022-10-24 NOTE — Progress Notes (Signed)
  Subjective:  Patient ID: Leah Walsh, female    DOB: Dec 27, 1940,  MRN: [id number]  Chief Complaint  Patient presents with   Routine Post Op    POV #3 DOS 09/13/2022 RT GREAT TOE AMPUTATION/diabetic shoe appt at 9am     82 y.o. female returns for post-op check.  Doing well there is still some opening area Review of Systems: Negative except as noted in the HPI. Denies N/V/F/Ch.   Objective:  There were no vitals filed for this visit. There is no height or weight on file to calculate BMI. Constitutional Well developed. Well nourished.  Vascular Foot warm and well perfused. Capillary refill normal to all digits.  Calf is soft and supple, no posterior calf or knee pain, negative Homans' sign  Neurologic Normal speech. Oriented to person, place, and time. Epicritic sensation to light touch grossly absent bilaterally.  Dermatologic Residual ulceration measures 1.2 x 1.0 x 0.4 cm, exposed subcutaneous tissue, serous drainage  Orthopedic: She has no tenderness to palpation noted about the surgical site.    Assessment:   1. Osteomyelitis of great toe of right foot ([place])   2. Skin ulcer of toe of right foot with fat layer exposed ([place])    Plan:  Patient was evaluated and treated and all questions answered.  S/p foot surgery right -Still has some residual open ulceration with exposed subcutaneous tissue.  Full-thickness excisional debridement was carried out in an excisional manner with a sharp scalpel to remove slough biofilm and fibrous tissue.  Postdebridement measurements are noted above.  Begin local wound care with collagen dressing applications and silicone bordered foam dressings.  Return in 3 weeks for ongoing wound care.  Referral sent to [facility] for home wound care supplies to change daily for collagen and silicone foam border dressings  No follow-ups on file.

## 2022-10-24 NOTE — Progress Notes (Signed)
Patient presents to the office today for diabetic shoe and insole measuring.  Patient has not completely healed from amputation and will return in 3 weeks for follow up wit Dr Sherryle Lis. At that appointment we will confirm whether she is to get a to filler or traditional insert.   Documentation of medical necessity will be sent to patient's treating diabetic doctor to verify and sign.   Patient's diabetic provider: [name]  NPI: [id number]  Shoes and insoles will be ordered at that time and patient will be notified for an appointment for fitting when they arrive.   Patient shoe selection-   1st   Shoe choice:   [id number]

## 2022-10-25 DIAGNOSIS — Z23 Encounter for immunization: Secondary | ICD-10-CM | POA: Diagnosis not present

## 2022-10-25 DIAGNOSIS — D631 Anemia in chronic kidney disease: Secondary | ICD-10-CM | POA: Diagnosis not present

## 2022-10-25 DIAGNOSIS — N186 End stage renal disease: Secondary | ICD-10-CM | POA: Diagnosis not present

## 2022-10-25 DIAGNOSIS — N2581 Secondary hyperparathyroidism of renal origin: Secondary | ICD-10-CM | POA: Diagnosis not present

## 2022-10-25 DIAGNOSIS — D509 Iron deficiency anemia, unspecified: Secondary | ICD-10-CM | POA: Diagnosis not present

## 2022-10-31 DIAGNOSIS — E1122 Type 2 diabetes mellitus with diabetic chronic kidney disease: Secondary | ICD-10-CM | POA: Diagnosis not present

## 2022-11-13 ENCOUNTER — Ambulatory Visit (INDEPENDENT_AMBULATORY_CARE_PROVIDER_SITE_OTHER): Payer: PPO | Admitting: Podiatry

## 2022-11-13 DIAGNOSIS — L97519 Non-pressure chronic ulcer of other part of right foot with unspecified severity: Secondary | ICD-10-CM | POA: Diagnosis not present

## 2022-11-13 DIAGNOSIS — E11621 Type 2 diabetes mellitus with foot ulcer: Secondary | ICD-10-CM

## 2022-11-14 ENCOUNTER — Telehealth: Payer: Self-pay | Admitting: Podiatry

## 2022-11-14 NOTE — Telephone Encounter (Signed)
Lvm for pt to call back regarding diabetic shoe order. Called to see if pt would have time to be casted the day she follows up with Dr Sherryle Lis so we can get her added to the schedule.

## 2022-11-17 NOTE — Progress Notes (Signed)
  Subjective:  Patient ID: Leah Walsh, female    DOB: 08/21/1941,  MRN: [id number]  Chief Complaint  Patient presents with   Routine Post Op    POV #4 DOS 09/13/2022 RT GREAT TOE AMPUTATION     82 y.o. female returns for post-op check.  Doing well she is changing the dressing daily with collagen   Review of Systems: Negative except as noted in the HPI. Denies N/V/F/Ch.   Objective:  There were no vitals filed for this visit. There is no height or weight on file to calculate BMI. Constitutional Well developed. Well nourished.  Vascular Foot warm and well perfused. Capillary refill normal to all digits.  Calf is soft and supple, no posterior calf or knee pain, negative Homans' sign  Neurologic Normal speech. Oriented to person, place, and time. Epicritic sensation to light touch grossly absent bilaterally.  Dermatologic Residual ulceration measures 0.7 x 0.3 x 0.3 cm, exposed subcutaneous tissue, no erythema or drainage, surrounding hyperkeratosis, fibrogranular wound bed  Orthopedic: She has no tenderness to palpation noted about the surgical site.    Assessment:   1. Ulcer of right foot due to type 2 diabetes mellitus ([place])    Plan:  Patient was evaluated and treated and all questions answered.  S/p foot surgery right -Still has some residual open ulceration with exposed subcutaneous tissue.  Full-thickness excisional debridement was carried out in an excisional manner with a sharp scalpel to remove slough biofilm and fibrous tissue.  Postdebridement measurements are noted above.  Continue home collagen dressing applications and silicone bordered foam dressings.  Return in 3 weeks for ongoing wound care.   Significant improvement today  Return in about 3 weeks (around 12/04/2022) for wound care.

## 2022-11-18 ENCOUNTER — Encounter (INDEPENDENT_AMBULATORY_CARE_PROVIDER_SITE_OTHER): Payer: PPO | Admitting: Ophthalmology

## 2022-11-18 DIAGNOSIS — E113393 Type 2 diabetes mellitus with moderate nonproliferative diabetic retinopathy without macular edema, bilateral: Secondary | ICD-10-CM | POA: Diagnosis not present

## 2022-11-18 DIAGNOSIS — H43813 Vitreous degeneration, bilateral: Secondary | ICD-10-CM | POA: Diagnosis not present

## 2022-11-18 DIAGNOSIS — H353122 Nonexudative age-related macular degeneration, left eye, intermediate dry stage: Secondary | ICD-10-CM

## 2022-11-18 DIAGNOSIS — I10 Essential (primary) hypertension: Secondary | ICD-10-CM

## 2022-11-18 DIAGNOSIS — H35033 Hypertensive retinopathy, bilateral: Secondary | ICD-10-CM | POA: Diagnosis not present

## 2022-11-18 DIAGNOSIS — H34831 Tributary (branch) retinal vein occlusion, right eye, with macular edema: Secondary | ICD-10-CM | POA: Diagnosis not present

## 2022-11-24 DIAGNOSIS — N186 End stage renal disease: Secondary | ICD-10-CM | POA: Diagnosis not present

## 2022-11-24 DIAGNOSIS — Z992 Dependence on renal dialysis: Secondary | ICD-10-CM | POA: Diagnosis not present

## 2022-11-26 DIAGNOSIS — N2581 Secondary hyperparathyroidism of renal origin: Secondary | ICD-10-CM | POA: Diagnosis not present

## 2022-11-26 DIAGNOSIS — D631 Anemia in chronic kidney disease: Secondary | ICD-10-CM | POA: Diagnosis not present

## 2022-11-26 DIAGNOSIS — D509 Iron deficiency anemia, unspecified: Secondary | ICD-10-CM | POA: Diagnosis not present

## 2022-11-26 DIAGNOSIS — N186 End stage renal disease: Secondary | ICD-10-CM | POA: Diagnosis not present

## 2022-11-28 DIAGNOSIS — E1122 Type 2 diabetes mellitus with diabetic chronic kidney disease: Secondary | ICD-10-CM | POA: Diagnosis not present

## 2022-12-04 ENCOUNTER — Ambulatory Visit: Payer: PPO | Admitting: Podiatry

## 2022-12-04 DIAGNOSIS — L97519 Non-pressure chronic ulcer of other part of right foot with unspecified severity: Secondary | ICD-10-CM

## 2022-12-04 DIAGNOSIS — E11621 Type 2 diabetes mellitus with foot ulcer: Secondary | ICD-10-CM

## 2022-12-08 ENCOUNTER — Encounter: Payer: Self-pay | Admitting: Podiatry

## 2022-12-08 NOTE — Progress Notes (Signed)
  Subjective:  Patient ID: Leah Walsh, female    DOB: 08/13/1941,  MRN: [id number]  Chief Complaint  Patient presents with   Foot Ulcer    3 week follow up right foot     83 y.o. female returns for post-op check.  Doing well she is changing the dressing daily with collagen   Review of Systems: Negative except as noted in the HPI. Denies N/V/F/Ch.   Objective:  There were no vitals filed for this visit. There is no height or weight on file to calculate BMI. Constitutional Well developed. Well nourished.  Vascular Foot warm and well perfused. Capillary refill normal to all digits.  Calf is soft and supple, no posterior calf or knee pain, negative Homans' sign  Neurologic Normal speech. Oriented to person, place, and time. Epicritic sensation to light touch grossly absent bilaterally.  Dermatologic Residual ulceration measures 0.3 x 0.3 x 0.3 cm, exposed subcutaneous tissue, no erythema or drainage, surrounding hyperkeratosis, fibrogranular wound bed  Orthopedic: She has no tenderness to palpation noted about the surgical site.    Assessment:   1. Ulcer of right foot due to type 2 diabetes mellitus    Plan:  Patient was evaluated and treated and all questions answered.  S/p foot surgery right -Still has some residual open ulceration with exposed subcutaneous tissue.  Full-thickness excisional debridement was carried out in an excisional manner with a sharp scalpel to remove slough biofilm and fibrous tissue.  Postdebridement measurements are noted above.  Continue home collagen dressing applications and silicone bordered foam dressings.  Return in 3 weeks for ongoing wound care.   Significant improvement today  Return in about 4 weeks (around 01/01/2023) for wound care.

## 2022-12-18 ENCOUNTER — Telehealth: Payer: Self-pay | Admitting: Podiatry

## 2022-12-18 NOTE — Telephone Encounter (Signed)
Pt called and was told to follow up in 4 weeks at her last appt on 4.10. She was scheduled for 4.20.2024. I did offer her an appt on 4.16 but she is on dialysis in [place] on Tuesdays and Thursdays so she could not come. Is it ok for her to wait until 4.20? She would need an appt on Monday or Wednesday which are days you are not here. Other option I asked pt about was changing providers if needed. She was not sure if she wanted to do that. I told pt I would send Dr Lilian Kapur a message but he is out of the office this week so she would not here from me until next week.She said thank you

## 2022-12-18 NOTE — Progress Notes (Signed)
HPI female never smoker followed for OSA, complicated by CAD/MI/stent, asthma (Dr. Lucie Leather), DM 2, CKD4 NPSG 12/05/10([place]) AHI 14.2/ hr, weight 178 lbs.  PFT 11/07/14-  Moderate Diffusion deficit 60%. Normal flows and lung volumes with insignificant response to bronchodilator. a1AT- 10/07/14-  Nl MM 123 OK  --------------------------------------------------------------------------------------------   07/12/21- 82 year old female never smoker followed for OSA, Insomnia, complicated by CAD/MI/stent, Chronic Diastolic dCHF, Asthma (Dr. Lucie Leather), DM 2, HTN, CKD5, Anemia, GERD, -Symbicort 80, albuterol HFA, also has Flovent HFA 110 CPAP auto 5-15/ [facility]/ [facility]  Recently replaced       AirSense 11 AutoSet Download- 93%, AHI 1.4/ hr Body weight today-172 lbs Covid vax-3 Moderna Flu vax-had -----Patient states she is doing better with her sleep, no concerns Using ZzzQuil for sleep when needed.  Comfortable with CPAP.  Download reviewed. Notes occasional wheezing with no significant exacerbations.  Asthma managed by her allergy office but hasn't seen Dr Lucie Leather in 2 years- and she says inhalers work well. She has dialysis access in her left arm but still "on hold".  12/20/22-  82 year old female never smoker followed for OSA, Insomnia, complicated by CAD/MI/stent, Chronic Diastolic dCHF, Asthma,  DM 2, HTN, CKD5, Anemia, GERD, ESRD/Hemodialysis, -Symbicort 80, albuterol HFA, also has Flovent HFA 110 CPAP auto 5-15/ [facility]/ [facility]      AirSense 11 AutoSet Download compliance-57%, AHI 0.9/hr Body weight today-181 lbs -----Pt is doing okay. Pt has been having trouble sleeping -going to sleep and waking up a lot Download reviewed.  CPAP every night with good control but insomnia is interfering with amount of time she uses it.  We discussed sleep habits and management of insomnia.  We are going to try trazodone. Uses rescue inhaler occasionally.  Has not felt need for maintenance inhaler at all. Toe  amputated in January, still wearing orthopedic boot-slow to heal. Now on hemodialysis with access left antecubital space.  ROS see HPI    + = positive  Constitutional:   No-   weight loss, night sweats, fevers, chills, fatigue, lassitude. HEENT:   No-  headaches, difficulty swallowing, tooth/dental problems, sore throat,       No-  sneezing, itching, ear ache, + nasal congestion, post nasal drip,  CV:  No-   chest pain, orthopnea, PND, + swelling in lower extremities, anasarca,                                            dizziness, + palpitations Resp: No-   shortness of breath with exertion or at rest.              No-   productive cough,  No non-productive cough,  No- coughing up of blood.              No-   change in color of mucus.  Wheezing+ occasional Skin: No-   rash or lesions. GI:  No-   heartburn, + indigestion, abdominal pain, nausea, vomiting,  GU:  MS:  + joint pain or swelling.   Neuro-     nothing unusual Psych:  No- change in mood or affect. No depression or anxiety.  No memory loss.  OBJ- Physical Exam General- Alert, Oriented, Affect-appropriate, Distress- none acute, not obese Skin- rash-none, lesions- none, excoriation- none Lymphadenopathy- none Head- atraumatic            Eyes- Gross vision intact, PERRLA, conjunctivae and  secretions clear            Ears- Hearing, canals-normal            Nose- Clear, no-Septal dev, mucus, polyps, erosion, perforation             Throat- Mallampati III , mucosa clear , drainage- none, tonsils- atrophic Neck- flexible , trachea midline, no stridor , thyroid nl, carotid no bruit Chest - symmetrical excursion , unlabored           Heart/CV- RRR , no murmur , no gallop  , no rub, nl s1 s2                           - JVD- none , edema- none, stasis changes- none, varices- none           Lung- clear to P&A, wheeze- none, cough- none , dullness+ minimal at bases, rub- none           Chest wall-  Abd-  Br/ Gen/ Rectal- Not done, not  indicated Extrem- + dialysis Access left arm, +orthopedic boot R foot Neuro- grossly intact to observation

## 2022-12-20 ENCOUNTER — Ambulatory Visit: Payer: PPO | Admitting: Internal Medicine

## 2022-12-20 ENCOUNTER — Encounter: Payer: Self-pay | Admitting: Internal Medicine

## 2022-12-20 VITALS — BP 142/70 | HR 63 | Ht 68.0 in | Wt 181.0 lb

## 2022-12-20 DIAGNOSIS — J4530 Mild persistent asthma, uncomplicated: Secondary | ICD-10-CM

## 2022-12-20 DIAGNOSIS — G4733 Obstructive sleep apnea (adult) (pediatric): Secondary | ICD-10-CM

## 2022-12-20 MED ORDER — TRAZODONE HCL 50 MG PO TABS: ORAL_TABLET | ORAL | 5 refills | Status: DC

## 2022-12-20 NOTE — Assessment & Plan Note (Signed)
Benefits from CPAP with good control.  We will work to help her improve amount of time each night, partly limited by insomnia. Plan-continue auto 5-15

## 2022-12-20 NOTE — Assessment & Plan Note (Signed)
Mild intermittent uncomplicated.  Rescue inhaler has been sufficient for very occasional use without maintenance controller.

## 2022-12-20 NOTE — Telephone Encounter (Signed)
Left message on voicemail asking patient if she wanted toe filler with her diabetic shoes or order the orthotics as normal?

## 2022-12-20 NOTE — Patient Instructions (Addendum)
Script sent to try trazodone for sleep- 1 at bedtime as needed  Try to use your CPAP at least 4 hours per night if you can.  Please call if we can help

## 2022-12-23 ENCOUNTER — Encounter (INDEPENDENT_AMBULATORY_CARE_PROVIDER_SITE_OTHER): Payer: PPO | Admitting: Ophthalmology

## 2022-12-23 DIAGNOSIS — H43813 Vitreous degeneration, bilateral: Secondary | ICD-10-CM

## 2022-12-23 DIAGNOSIS — Z794 Long term (current) use of insulin: Secondary | ICD-10-CM | POA: Diagnosis not present

## 2022-12-23 DIAGNOSIS — H34831 Tributary (branch) retinal vein occlusion, right eye, with macular edema: Secondary | ICD-10-CM

## 2022-12-23 DIAGNOSIS — I10 Essential (primary) hypertension: Secondary | ICD-10-CM | POA: Diagnosis not present

## 2022-12-23 DIAGNOSIS — H35033 Hypertensive retinopathy, bilateral: Secondary | ICD-10-CM | POA: Diagnosis not present

## 2022-12-23 DIAGNOSIS — H353122 Nonexudative age-related macular degeneration, left eye, intermediate dry stage: Secondary | ICD-10-CM

## 2022-12-23 DIAGNOSIS — H353211 Exudative age-related macular degeneration, right eye, with active choroidal neovascularization: Secondary | ICD-10-CM

## 2022-12-24 DIAGNOSIS — Z992 Dependence on renal dialysis: Secondary | ICD-10-CM | POA: Diagnosis not present

## 2022-12-24 DIAGNOSIS — N186 End stage renal disease: Secondary | ICD-10-CM | POA: Diagnosis not present

## 2022-12-26 DIAGNOSIS — D631 Anemia in chronic kidney disease: Secondary | ICD-10-CM | POA: Diagnosis not present

## 2022-12-26 DIAGNOSIS — N186 End stage renal disease: Secondary | ICD-10-CM | POA: Diagnosis not present

## 2022-12-26 DIAGNOSIS — N2581 Secondary hyperparathyroidism of renal origin: Secondary | ICD-10-CM | POA: Diagnosis not present

## 2022-12-26 DIAGNOSIS — D509 Iron deficiency anemia, unspecified: Secondary | ICD-10-CM | POA: Diagnosis not present

## 2022-12-26 DIAGNOSIS — E1122 Type 2 diabetes mellitus with diabetic chronic kidney disease: Secondary | ICD-10-CM | POA: Diagnosis not present

## 2022-12-30 NOTE — Telephone Encounter (Signed)
Patient said surgery incisions has not healed completely yet so she is going to hold off until  she gets the ok with Dr Lilian Kapur

## 2023-01-11 ENCOUNTER — Other Ambulatory Visit: Payer: Self-pay | Admitting: Internal Medicine

## 2023-01-13 ENCOUNTER — Ambulatory Visit: Payer: PPO | Admitting: Podiatry

## 2023-01-13 DIAGNOSIS — M2140 Flat foot [pes planus] (acquired), unspecified foot: Secondary | ICD-10-CM | POA: Diagnosis not present

## 2023-01-13 DIAGNOSIS — E1140 Type 2 diabetes mellitus with diabetic neuropathy, unspecified: Secondary | ICD-10-CM

## 2023-01-13 DIAGNOSIS — M2041 Other hammer toe(s) (acquired), right foot: Secondary | ICD-10-CM

## 2023-01-13 DIAGNOSIS — Z89411 Acquired absence of right great toe: Secondary | ICD-10-CM

## 2023-01-13 NOTE — Telephone Encounter (Signed)
Changes Requested    Name from pharmacy: TRAZODONE 50 MG TABLET       Will file in chart as: traZODone (DESYREL) 50 MG tablet   Sig: TAKE 1 TABLET BY MOUTH AT BEDTIME FOR SLEEP AS NEEDED   Disp: 90 tablet    Refills: 2   Start: 01/11/2023   Class: Normal   Non-formulary   Last ordered: 3 weeks ago (12/20/2022) by Waymon Budge, MD   Last refill: 12/20/2022   Rx #: [id number]   Pharmacy comment: REQUEST FOR 90 DAYS PRESCRIPTION.          Dr. Maple Hudson, please advise.   Allergies  Allergen Reactions   Compazine [Prochlorperazine Edisylate] Other (See Comments)    Stroke like symptoms   Colcrys [Colchicine] Diarrhea   Hydrocodone    Codeine Nausea And Vomiting     Current Outpatient Medications:    albuterol (VENTOLIN HFA) 108 (90 Base) MCG/ACT inhaler, Inhale 2 puffs into the lungs every 6 (six) hours as needed for wheezing or shortness of breath., Disp: 1 each, Rfl: 6   allopurinol (ZYLOPRIM) 100 MG tablet, Take 100 mg by mouth daily., Disp: , Rfl: 3   aspirin 81 MG tablet, Take 81 mg by mouth at bedtime. , Disp: , Rfl:    atorvastatin (LIPITOR) 80 MG tablet, TAKE 1 TABLET (80 MG TOTAL) BY MOUTH AT BEDTIME., Disp: 90 tablet, Rfl: 1   B-D UF III MINI PEN NEEDLES 31G X 5 MM MISC, , Disp: , Rfl: 6   Cholecalciferol (VITAMIN D) 125 MCG (5000 UT) CAPS, Take 1 capsule by mouth daily. , Disp: , Rfl:    Cobalamin Combinations (B12 FOLATE PO), Take 800 mcg by mouth daily., Disp: , Rfl:    Continuous Blood Gluc Receiver (FREESTYLE LIBRE 2 READER) DEVI, USE TO check blood sugar, Disp: , Rfl:    Continuous Blood Gluc Sensor (FREESTYLE LIBRE 14 DAY SENSOR) MISC, USE every 14 DAYS as directed, Disp: , Rfl:    Ergocalciferol 50 MCG (2000 UT) TABS, TUFR, Disp: , Rfl:    ezetimibe (ZETIA) 10 MG tablet, TAKE 1 TABLET (10 MG TOTAL) BY MOUTH DAILY., Disp: 90 tablet, Rfl: 1   furosemide (LASIX) 40 MG tablet, Take 1 tablet (40 mg total) by mouth 2 (two) times daily., Disp: 180 tablet, Rfl: 3    hydrALAZINE (APRESOLINE) 25 MG tablet, Take 25 mg by mouth 3 (three) times daily. Take 3 tablets ( 75 mg ) three times a day, Disp: , Rfl:    Insulin Degludec (TRESIBA FLEXTOUCH [place]), Inject 50 Units into the skin daily., Disp: , Rfl:    insulin lispro (HUMALOG) 100 UNIT/ML injection, Inject into the skin., Disp: , Rfl:    labetalol (NORMODYNE) 100 MG tablet, Take 50 mg by mouth in the morning and at bedtime., Disp: , Rfl:    losartan (COZAAR) 50 MG tablet, Take 50 mg by mouth daily., Disp: , Rfl:    montelukast (SINGULAIR) 10 MG tablet, Take 1 tablet (10 mg total) by mouth daily., Disp: 30 tablet, Rfl: 5   Multiple Vitamin (MULTIVITAMIN) tablet, Take 1 tablet by mouth daily., Disp: , Rfl:    Multiple Vitamins-Minerals (PRESERVISION AREDS 2 PO), Take 1 capsule by mouth 2 (two) times daily. , Disp: , Rfl:    mupirocin ointment (BACTROBAN) 2 %, Apply ONE application. topically TWO times daily, Disp: 22 g, Rfl: 2   nitroGLYCERIN (NITROSTAT) 0.4 MG SL tablet, PLACE 1 TABLET (0.4 MG TOTAL) UNDER THE TONGUE EVERY 5 (FIVE)  MINUTES AS NEEDED FOR CHEST PAIN. (Patient not taking: Reported on 12/20/2022), Disp: 25 tablet, Rfl: 3   omeprazole (PRILOSEC) 20 MG capsule, 40 mg., Disp: , Rfl:    potassium chloride (KLOR-CON) 10 MEQ tablet, Take 10 mEq by mouth every morning., Disp: , Rfl:    sertraline (ZOLOFT) 25 MG tablet, Take 25 mg by mouth daily., Disp: , Rfl:    traZODone (DESYREL) 50 MG tablet, 1 at bedtime for sleep as needed, Disp: 30 tablet, Rfl: 5   trimethoprim-polymyxin b (POLYTRIM) ophthalmic solution, instill ONE DROP IN THE RIGHT EYE FOUR TIMES DAILY FOR 2 DAYS AFTER each monthly eye injection, Disp: , Rfl:    vitamin B-12 (CYANOCOBALAMIN) 1000 MCG tablet, Take 1,000 mcg by mouth every morning., Disp: , Rfl:

## 2023-01-13 NOTE — Telephone Encounter (Signed)
Trazodone refilled.

## 2023-01-13 NOTE — Progress Notes (Signed)
  Subjective:  Patient ID: Leah Walsh, female    DOB: [birth year],  MRN: [id number]  Chief Complaint  Patient presents with   Foot Ulcer    4 week follow up right foot     82 y.o. female returns for post-op check.  She is doing well she says it is healed  Review of Systems: Negative except as noted in the HPI. Denies N/V/F/Ch.   Objective:  There were no vitals filed for this visit. There is no height or weight on file to calculate BMI. Constitutional Well developed. Well nourished.  Vascular Foot warm and well perfused. Capillary refill normal to all digits.  Calf is soft and supple, no posterior calf or knee pain, negative Homans' sign  Neurologic Normal speech. Oriented to person, place, and time. Epicritic sensation to light touch grossly absent bilaterally.  Dermatologic Incision is fully healed.  Callus on second toe  Orthopedic: She has no tenderness to palpation noted about the surgical site.  Hammertoe right second toe    Assessment:   1. Status post amputation of great toe, right ([facility])   2. Pes planus, unspecified laterality   3. Type 2 diabetes mellitus with diabetic neuropathy, unspecified whether long term insulin use ([facility])    Plan:  Patient was evaluated and treated and all questions answered.  S/p foot surgery right -Overall doing very well, wound has healed.  She was fitted for diabetic shoes previously, new cast today were taken to be sent off for foot orthoses with a filler.  She will be notified when these are ready.  We discussed the risk of ulceration on the second toe I would like her to wear a silicone offloading pad on this.  This was dispensed.  I will see her back in 3 months for follow-up and then will transition to at risk diabetic footcare on a regular basis  Return in about 3 months (around 04/15/2023) for amputation follow up, wound check, [facility].

## 2023-01-24 ENCOUNTER — Telehealth: Payer: Self-pay | Admitting: Podiatry

## 2023-01-24 DIAGNOSIS — Z992 Dependence on renal dialysis: Secondary | ICD-10-CM | POA: Diagnosis not present

## 2023-01-24 DIAGNOSIS — N186 End stage renal disease: Secondary | ICD-10-CM | POA: Diagnosis not present

## 2023-01-24 NOTE — Telephone Encounter (Signed)
Pt stated that she has noticed a callus on one of her toes. It's not bothersome but she did want to make you aware that she saw it. Offered to sched an appt but she declined. Please advise

## 2023-01-25 DIAGNOSIS — N2581 Secondary hyperparathyroidism of renal origin: Secondary | ICD-10-CM | POA: Diagnosis not present

## 2023-01-25 DIAGNOSIS — D509 Iron deficiency anemia, unspecified: Secondary | ICD-10-CM | POA: Diagnosis not present

## 2023-01-25 DIAGNOSIS — D631 Anemia in chronic kidney disease: Secondary | ICD-10-CM | POA: Diagnosis not present

## 2023-01-25 DIAGNOSIS — N186 End stage renal disease: Secondary | ICD-10-CM | POA: Diagnosis not present

## 2023-01-27 ENCOUNTER — Encounter (INDEPENDENT_AMBULATORY_CARE_PROVIDER_SITE_OTHER): Payer: PPO | Admitting: Ophthalmology

## 2023-01-27 DIAGNOSIS — H353122 Nonexudative age-related macular degeneration, left eye, intermediate dry stage: Secondary | ICD-10-CM

## 2023-01-27 DIAGNOSIS — Z794 Long term (current) use of insulin: Secondary | ICD-10-CM

## 2023-01-27 DIAGNOSIS — H34831 Tributary (branch) retinal vein occlusion, right eye, with macular edema: Secondary | ICD-10-CM | POA: Diagnosis not present

## 2023-01-27 DIAGNOSIS — H43813 Vitreous degeneration, bilateral: Secondary | ICD-10-CM

## 2023-01-27 DIAGNOSIS — I10 Essential (primary) hypertension: Secondary | ICD-10-CM | POA: Diagnosis not present

## 2023-01-27 DIAGNOSIS — H353211 Exudative age-related macular degeneration, right eye, with active choroidal neovascularization: Secondary | ICD-10-CM | POA: Diagnosis not present

## 2023-01-27 DIAGNOSIS — E113393 Type 2 diabetes mellitus with moderate nonproliferative diabetic retinopathy without macular edema, bilateral: Secondary | ICD-10-CM

## 2023-01-27 DIAGNOSIS — H35033 Hypertensive retinopathy, bilateral: Secondary | ICD-10-CM | POA: Diagnosis not present

## 2023-01-30 DIAGNOSIS — E1122 Type 2 diabetes mellitus with diabetic chronic kidney disease: Secondary | ICD-10-CM | POA: Diagnosis not present

## 2023-01-31 DIAGNOSIS — Z1339 Encounter for screening examination for other mental health and behavioral disorders: Secondary | ICD-10-CM | POA: Diagnosis not present

## 2023-01-31 DIAGNOSIS — Z0000 Encounter for general adult medical examination without abnormal findings: Secondary | ICD-10-CM | POA: Diagnosis not present

## 2023-01-31 DIAGNOSIS — Z1331 Encounter for screening for depression: Secondary | ICD-10-CM | POA: Diagnosis not present

## 2023-01-31 DIAGNOSIS — R300 Dysuria: Secondary | ICD-10-CM | POA: Diagnosis not present

## 2023-01-31 DIAGNOSIS — Z6828 Body mass index (BMI) 28.0-28.9, adult: Secondary | ICD-10-CM | POA: Diagnosis not present

## 2023-02-14 DIAGNOSIS — Z992 Dependence on renal dialysis: Secondary | ICD-10-CM | POA: Diagnosis not present

## 2023-02-14 DIAGNOSIS — J90 Pleural effusion, not elsewhere classified: Secondary | ICD-10-CM | POA: Diagnosis not present

## 2023-02-14 DIAGNOSIS — R0602 Shortness of breath: Secondary | ICD-10-CM | POA: Diagnosis not present

## 2023-02-14 DIAGNOSIS — R059 Cough, unspecified: Secondary | ICD-10-CM | POA: Diagnosis not present

## 2023-02-14 DIAGNOSIS — J9811 Atelectasis: Secondary | ICD-10-CM | POA: Diagnosis not present

## 2023-02-14 DIAGNOSIS — N186 End stage renal disease: Secondary | ICD-10-CM | POA: Diagnosis not present

## 2023-02-21 ENCOUNTER — Telehealth: Payer: Self-pay | Admitting: Podiatry

## 2023-02-21 NOTE — Telephone Encounter (Signed)
Called pt and explained that we had left a message to confirm she was wanting [place] to order the toe filler insert for her right foot.   She was not sure at first but I explained it would help with her balance when wearing the shoes. She decided she wanted to proceed.  Then when ordering the shoes they did not have them in the width she neede. I have got pt looking online at the orthofeet version of the shoe to see if she likes it and we can get the order started. We will also need a prior auth. She is to call me back to confirm the shoe.

## 2023-02-21 NOTE — Telephone Encounter (Signed)
Pt called checking on diabetic shoes, inserts and filler amputated toe.  She was measured 5.20.24  I told pt I would investigate and give her a call back once I have more information.

## 2023-02-23 DIAGNOSIS — Z992 Dependence on renal dialysis: Secondary | ICD-10-CM | POA: Diagnosis not present

## 2023-02-23 DIAGNOSIS — N186 End stage renal disease: Secondary | ICD-10-CM | POA: Diagnosis not present

## 2023-02-24 NOTE — Telephone Encounter (Signed)
Called back Friday afternoon and left message stating the shoes she looked at that I recommended are slip ins and she would like lace ups.   I returned call and left message that the one I asked her to look at do tie up but because of the back heel is called a slip in. I also offered her to look at the apex performance shoe that is black but has white dots on it. That laces up. I asked her to call me back to discuss further.

## 2023-02-25 ENCOUNTER — Telehealth: Payer: Self-pay | Admitting: Podiatry

## 2023-02-25 DIAGNOSIS — D509 Iron deficiency anemia, unspecified: Secondary | ICD-10-CM | POA: Diagnosis not present

## 2023-02-25 DIAGNOSIS — D631 Anemia in chronic kidney disease: Secondary | ICD-10-CM | POA: Diagnosis not present

## 2023-02-25 DIAGNOSIS — N2581 Secondary hyperparathyroidism of renal origin: Secondary | ICD-10-CM | POA: Diagnosis not present

## 2023-02-25 DIAGNOSIS — R58 Hemorrhage, not elsewhere classified: Secondary | ICD-10-CM | POA: Diagnosis not present

## 2023-02-25 DIAGNOSIS — N186 End stage renal disease: Secondary | ICD-10-CM | POA: Diagnosis not present

## 2023-02-25 NOTE — Telephone Encounter (Signed)
Left message for pt to call to discuss shoe choices.

## 2023-02-26 ENCOUNTER — Telehealth: Payer: Self-pay | Admitting: Podiatry

## 2023-02-26 NOTE — Telephone Encounter (Signed)
Pt left message returning my call. We are playing phone tag.  I returned call and spoke to pts husband as pt was not available at the time I called. He was going to have her call me back but I am not at my desk. I told pt to tell pt that I was going to order 2 different black lace up shoes and she could pick which she one she would like.

## 2023-03-03 ENCOUNTER — Encounter (INDEPENDENT_AMBULATORY_CARE_PROVIDER_SITE_OTHER): Payer: PPO | Admitting: Ophthalmology

## 2023-03-03 DIAGNOSIS — H34831 Tributary (branch) retinal vein occlusion, right eye, with macular edema: Secondary | ICD-10-CM

## 2023-03-03 DIAGNOSIS — I10 Essential (primary) hypertension: Secondary | ICD-10-CM | POA: Diagnosis not present

## 2023-03-03 DIAGNOSIS — Z794 Long term (current) use of insulin: Secondary | ICD-10-CM | POA: Diagnosis not present

## 2023-03-03 DIAGNOSIS — H353211 Exudative age-related macular degeneration, right eye, with active choroidal neovascularization: Secondary | ICD-10-CM

## 2023-03-03 DIAGNOSIS — H35033 Hypertensive retinopathy, bilateral: Secondary | ICD-10-CM | POA: Diagnosis not present

## 2023-03-03 DIAGNOSIS — H353122 Nonexudative age-related macular degeneration, left eye, intermediate dry stage: Secondary | ICD-10-CM

## 2023-03-03 DIAGNOSIS — E113392 Type 2 diabetes mellitus with moderate nonproliferative diabetic retinopathy without macular edema, left eye: Secondary | ICD-10-CM

## 2023-03-03 DIAGNOSIS — H43813 Vitreous degeneration, bilateral: Secondary | ICD-10-CM | POA: Diagnosis not present

## 2023-03-05 ENCOUNTER — Telehealth: Payer: Self-pay | Admitting: Podiatry

## 2023-03-05 NOTE — Telephone Encounter (Signed)
Pt called and has decided she did not want the toe filler insert she just wants the regular inserts. She also decided on the orthotfeet kita gray ([id number]) 10 w.   I cxled the old order with the toe filler and resubmitted a new one.

## 2023-03-06 DIAGNOSIS — E1122 Type 2 diabetes mellitus with diabetic chronic kidney disease: Secondary | ICD-10-CM | POA: Diagnosis not present

## 2023-03-26 DIAGNOSIS — N186 End stage renal disease: Secondary | ICD-10-CM | POA: Diagnosis not present

## 2023-03-26 DIAGNOSIS — Z992 Dependence on renal dialysis: Secondary | ICD-10-CM | POA: Diagnosis not present

## 2023-03-27 DIAGNOSIS — D509 Iron deficiency anemia, unspecified: Secondary | ICD-10-CM | POA: Diagnosis not present

## 2023-03-27 DIAGNOSIS — D631 Anemia in chronic kidney disease: Secondary | ICD-10-CM | POA: Diagnosis not present

## 2023-03-27 DIAGNOSIS — N2581 Secondary hyperparathyroidism of renal origin: Secondary | ICD-10-CM | POA: Diagnosis not present

## 2023-03-27 DIAGNOSIS — N186 End stage renal disease: Secondary | ICD-10-CM | POA: Diagnosis not present

## 2023-04-03 DIAGNOSIS — E1122 Type 2 diabetes mellitus with diabetic chronic kidney disease: Secondary | ICD-10-CM | POA: Diagnosis not present

## 2023-04-07 ENCOUNTER — Encounter (INDEPENDENT_AMBULATORY_CARE_PROVIDER_SITE_OTHER): Payer: PPO | Admitting: Ophthalmology

## 2023-04-07 DIAGNOSIS — H353211 Exudative age-related macular degeneration, right eye, with active choroidal neovascularization: Secondary | ICD-10-CM | POA: Diagnosis not present

## 2023-04-07 DIAGNOSIS — H34831 Tributary (branch) retinal vein occlusion, right eye, with macular edema: Secondary | ICD-10-CM

## 2023-04-07 DIAGNOSIS — H353122 Nonexudative age-related macular degeneration, left eye, intermediate dry stage: Secondary | ICD-10-CM

## 2023-04-07 DIAGNOSIS — I10 Essential (primary) hypertension: Secondary | ICD-10-CM

## 2023-04-07 DIAGNOSIS — H43813 Vitreous degeneration, bilateral: Secondary | ICD-10-CM | POA: Diagnosis not present

## 2023-04-07 DIAGNOSIS — H35033 Hypertensive retinopathy, bilateral: Secondary | ICD-10-CM

## 2023-04-16 DIAGNOSIS — H40013 Open angle with borderline findings, low risk, bilateral: Secondary | ICD-10-CM | POA: Diagnosis not present

## 2023-04-21 ENCOUNTER — Ambulatory Visit: Payer: PPO | Admitting: Podiatry

## 2023-04-21 ENCOUNTER — Encounter: Payer: Self-pay | Admitting: Podiatry

## 2023-04-21 DIAGNOSIS — M2041 Other hammer toe(s) (acquired), right foot: Secondary | ICD-10-CM | POA: Diagnosis not present

## 2023-04-21 DIAGNOSIS — M21612 Bunion of left foot: Secondary | ICD-10-CM | POA: Diagnosis not present

## 2023-04-21 DIAGNOSIS — M2012 Hallux valgus (acquired), left foot: Secondary | ICD-10-CM

## 2023-04-21 NOTE — Progress Notes (Signed)
  Subjective:  Patient ID: Leah Walsh, female    DOB: 02/24/1941,  MRN: [id number]  Chief Complaint  Patient presents with   Diabetes    "I'm here for a check up.  My toes are hurting.  I haven't heard anything about my shoes yet.  It's been quite a while."     82 y.o. female returns for post-op check.  Feeling some soreness in the right second toe and side of the left great toe that is rubbing on the second toe  Review of Systems: Negative except as noted in the HPI. Denies N/V/F/Ch.   Objective:  There were no vitals filed for this visit. There is no height or weight on file to calculate BMI. Constitutional Well developed. Well nourished.  Vascular Foot warm and well perfused. Capillary refill normal to all digits.  Calf is soft and supple, no posterior calf or knee pain, negative Homans' sign  Neurologic Normal speech. Oriented to person, place, and time. Epicritic sensation to light touch grossly absent bilaterally.  Dermatologic Incision is fully healed.  Callus on second toe tip on right foot, left foot rubbing on second toe hallux  Orthopedic: She has no tenderness to palpation noted about the surgical site.  Hammertoe right second toe that is reducible.  She has hallux valgus on the left    Assessment:   1. Hammertoe of right foot   2. Hallux valgus with bunions, left     Plan:  Patient was evaluated and treated and all questions answered.  S/p foot surgery right -Her amputation is well-healed, her right second toe is giving her some trouble causing pain in the left hallux is rubbing on the second toe causing some discomfort as well.  I recommended offloading with silicone padding and a toe crest and digital corn pad on the left was dispensed.  Utilize these to offload pressure.  Discussed percutaneous tenotomy on the right second toe could offer some relief if needed.  I will see her back in 3 months to reevaluate sooner if this is giving her further trouble.  If  still painful at that point may consider tenotomy at that time  Return in about 3 months (around 07/22/2023) for follow up on R 2nd toe and left big toe.

## 2023-04-26 DIAGNOSIS — Z992 Dependence on renal dialysis: Secondary | ICD-10-CM | POA: Diagnosis not present

## 2023-04-26 DIAGNOSIS — N186 End stage renal disease: Secondary | ICD-10-CM | POA: Diagnosis not present

## 2023-04-29 DIAGNOSIS — Z23 Encounter for immunization: Secondary | ICD-10-CM | POA: Diagnosis not present

## 2023-04-29 DIAGNOSIS — D509 Iron deficiency anemia, unspecified: Secondary | ICD-10-CM | POA: Diagnosis not present

## 2023-04-29 DIAGNOSIS — D631 Anemia in chronic kidney disease: Secondary | ICD-10-CM | POA: Diagnosis not present

## 2023-04-29 DIAGNOSIS — N2581 Secondary hyperparathyroidism of renal origin: Secondary | ICD-10-CM | POA: Diagnosis not present

## 2023-04-29 DIAGNOSIS — N186 End stage renal disease: Secondary | ICD-10-CM | POA: Diagnosis not present

## 2023-05-01 DIAGNOSIS — E1122 Type 2 diabetes mellitus with diabetic chronic kidney disease: Secondary | ICD-10-CM | POA: Diagnosis not present

## 2023-05-05 ENCOUNTER — Encounter (INDEPENDENT_AMBULATORY_CARE_PROVIDER_SITE_OTHER): Payer: PPO | Admitting: Ophthalmology

## 2023-05-05 DIAGNOSIS — H353211 Exudative age-related macular degeneration, right eye, with active choroidal neovascularization: Secondary | ICD-10-CM | POA: Diagnosis not present

## 2023-05-05 DIAGNOSIS — H43813 Vitreous degeneration, bilateral: Secondary | ICD-10-CM

## 2023-05-05 DIAGNOSIS — H34831 Tributary (branch) retinal vein occlusion, right eye, with macular edema: Secondary | ICD-10-CM

## 2023-05-05 DIAGNOSIS — H35033 Hypertensive retinopathy, bilateral: Secondary | ICD-10-CM

## 2023-05-05 DIAGNOSIS — I10 Essential (primary) hypertension: Secondary | ICD-10-CM

## 2023-05-05 DIAGNOSIS — H353122 Nonexudative age-related macular degeneration, left eye, intermediate dry stage: Secondary | ICD-10-CM | POA: Diagnosis not present

## 2023-05-05 DIAGNOSIS — Z794 Long term (current) use of insulin: Secondary | ICD-10-CM

## 2023-05-05 DIAGNOSIS — E113392 Type 2 diabetes mellitus with moderate nonproliferative diabetic retinopathy without macular edema, left eye: Secondary | ICD-10-CM | POA: Diagnosis not present

## 2023-05-14 DIAGNOSIS — Z6826 Body mass index (BMI) 26.0-26.9, adult: Secondary | ICD-10-CM | POA: Diagnosis not present

## 2023-05-14 DIAGNOSIS — R300 Dysuria: Secondary | ICD-10-CM | POA: Diagnosis not present

## 2023-05-26 DIAGNOSIS — Z992 Dependence on renal dialysis: Secondary | ICD-10-CM | POA: Diagnosis not present

## 2023-05-26 DIAGNOSIS — N186 End stage renal disease: Secondary | ICD-10-CM | POA: Diagnosis not present

## 2023-05-27 DIAGNOSIS — N2581 Secondary hyperparathyroidism of renal origin: Secondary | ICD-10-CM | POA: Diagnosis not present

## 2023-05-27 DIAGNOSIS — D631 Anemia in chronic kidney disease: Secondary | ICD-10-CM | POA: Diagnosis not present

## 2023-05-27 DIAGNOSIS — D509 Iron deficiency anemia, unspecified: Secondary | ICD-10-CM | POA: Diagnosis not present

## 2023-05-27 DIAGNOSIS — N186 End stage renal disease: Secondary | ICD-10-CM | POA: Diagnosis not present

## 2023-05-28 ENCOUNTER — Other Ambulatory Visit: Payer: PPO

## 2023-05-29 DIAGNOSIS — E1122 Type 2 diabetes mellitus with diabetic chronic kidney disease: Secondary | ICD-10-CM | POA: Diagnosis not present

## 2023-06-02 ENCOUNTER — Encounter (INDEPENDENT_AMBULATORY_CARE_PROVIDER_SITE_OTHER): Payer: PPO | Admitting: Ophthalmology

## 2023-06-02 DIAGNOSIS — H34831 Tributary (branch) retinal vein occlusion, right eye, with macular edema: Secondary | ICD-10-CM

## 2023-06-02 DIAGNOSIS — E113392 Type 2 diabetes mellitus with moderate nonproliferative diabetic retinopathy without macular edema, left eye: Secondary | ICD-10-CM

## 2023-06-02 DIAGNOSIS — H43813 Vitreous degeneration, bilateral: Secondary | ICD-10-CM

## 2023-06-02 DIAGNOSIS — H353211 Exudative age-related macular degeneration, right eye, with active choroidal neovascularization: Secondary | ICD-10-CM | POA: Diagnosis not present

## 2023-06-02 DIAGNOSIS — H35033 Hypertensive retinopathy, bilateral: Secondary | ICD-10-CM

## 2023-06-02 DIAGNOSIS — I10 Essential (primary) hypertension: Secondary | ICD-10-CM

## 2023-06-02 DIAGNOSIS — Z794 Long term (current) use of insulin: Secondary | ICD-10-CM | POA: Diagnosis not present

## 2023-06-02 DIAGNOSIS — H353122 Nonexudative age-related macular degeneration, left eye, intermediate dry stage: Secondary | ICD-10-CM | POA: Diagnosis not present

## 2023-06-03 ENCOUNTER — Telehealth: Payer: Self-pay

## 2023-06-03 NOTE — Telephone Encounter (Signed)
Clearance has been sent to the requesting office for procedure type and number of teeth.

## 2023-06-05 ENCOUNTER — Telehealth: Payer: Self-pay

## 2023-06-05 NOTE — Telephone Encounter (Signed)
Pre-operative Risk Assessment    Patient Name: Leah Walsh  DOB: 09/01/1940 MRN: [id number]     Request for Surgical Clearance    Procedure:  Dental Extraction - Amount of Teeth to be Pulled:  1  Date of Surgery:  Clearance TBD                                 Surgeon:   Surgeon's Group or Practice Name:  [facility] Phone number:  [phone] Fax number:  [phone]   Type of Clearance Requested:   - Medical    Type of Anesthesia:  Local    Additional requests/questions:    SignedDione Housekeeper   06/05/2023, 11:30 AM

## 2023-06-05 NOTE — Telephone Encounter (Signed)
Patient Name: Leah Walsh  DOB: 04-Jan-1941 MRN: [id number]  Primary Cardiologist: None  Chart reviewed as part of pre-operative protocol coverage.   Simple dental extractions (i.e. 1-2 teeth) are considered low risk procedures per guidelines and generally do not require any specific cardiac clearance. It is also generally accepted that for simple extractions and dental cleanings, there is no need to interrupt blood thinner therapy.  SBE prophylaxis is not required for the patient from a cardiac standpoint.  I will route this recommendation to the requesting party via Epic fax function and remove from pre-op pool.  Please call with questions.  Joylene Grapes, NP 06/05/2023, 12:46 PM

## 2023-06-09 DIAGNOSIS — M79605 Pain in left leg: Secondary | ICD-10-CM | POA: Diagnosis not present

## 2023-06-09 DIAGNOSIS — E1140 Type 2 diabetes mellitus with diabetic neuropathy, unspecified: Secondary | ICD-10-CM | POA: Diagnosis not present

## 2023-06-09 DIAGNOSIS — M7120 Synovial cyst of popliteal space [Baker], unspecified knee: Secondary | ICD-10-CM | POA: Diagnosis not present

## 2023-06-09 DIAGNOSIS — N186 End stage renal disease: Secondary | ICD-10-CM | POA: Diagnosis not present

## 2023-06-09 DIAGNOSIS — E1122 Type 2 diabetes mellitus with diabetic chronic kidney disease: Secondary | ICD-10-CM | POA: Diagnosis not present

## 2023-06-09 DIAGNOSIS — Z992 Dependence on renal dialysis: Secondary | ICD-10-CM | POA: Diagnosis not present

## 2023-06-09 DIAGNOSIS — G629 Polyneuropathy, unspecified: Secondary | ICD-10-CM | POA: Diagnosis not present

## 2023-06-09 DIAGNOSIS — M79661 Pain in right lower leg: Secondary | ICD-10-CM | POA: Diagnosis not present

## 2023-06-09 DIAGNOSIS — M7989 Other specified soft tissue disorders: Secondary | ICD-10-CM | POA: Diagnosis not present

## 2023-06-09 DIAGNOSIS — M79604 Pain in right leg: Secondary | ICD-10-CM | POA: Diagnosis not present

## 2023-06-09 DIAGNOSIS — I10 Essential (primary) hypertension: Secondary | ICD-10-CM | POA: Diagnosis not present

## 2023-06-09 DIAGNOSIS — M7122 Synovial cyst of popliteal space [Baker], left knee: Secondary | ICD-10-CM | POA: Diagnosis not present

## 2023-06-09 DIAGNOSIS — I120 Hypertensive chronic kidney disease with stage 5 chronic kidney disease or end stage renal disease: Secondary | ICD-10-CM | POA: Diagnosis not present

## 2023-06-11 ENCOUNTER — Other Ambulatory Visit: Payer: PPO

## 2023-06-20 DIAGNOSIS — R079 Chest pain, unspecified: Secondary | ICD-10-CM | POA: Diagnosis not present

## 2023-06-20 DIAGNOSIS — I10 Essential (primary) hypertension: Secondary | ICD-10-CM | POA: Diagnosis not present

## 2023-06-20 DIAGNOSIS — R918 Other nonspecific abnormal finding of lung field: Secondary | ICD-10-CM | POA: Diagnosis not present

## 2023-06-20 DIAGNOSIS — I959 Hypotension, unspecified: Secondary | ICD-10-CM | POA: Diagnosis not present

## 2023-06-20 DIAGNOSIS — I517 Cardiomegaly: Secondary | ICD-10-CM | POA: Diagnosis not present

## 2023-06-20 DIAGNOSIS — R0789 Other chest pain: Secondary | ICD-10-CM | POA: Diagnosis not present

## 2023-06-20 DIAGNOSIS — J810 Acute pulmonary edema: Secondary | ICD-10-CM | POA: Diagnosis not present

## 2023-06-20 DIAGNOSIS — R0989 Other specified symptoms and signs involving the circulatory and respiratory systems: Secondary | ICD-10-CM | POA: Diagnosis not present

## 2023-06-20 DIAGNOSIS — R0902 Hypoxemia: Secondary | ICD-10-CM | POA: Diagnosis not present

## 2023-06-21 DIAGNOSIS — N186 End stage renal disease: Secondary | ICD-10-CM | POA: Diagnosis not present

## 2023-06-21 DIAGNOSIS — Z8701 Personal history of pneumonia (recurrent): Secondary | ICD-10-CM | POA: Diagnosis not present

## 2023-06-21 DIAGNOSIS — J810 Acute pulmonary edema: Secondary | ICD-10-CM | POA: Diagnosis not present

## 2023-06-21 DIAGNOSIS — J159 Unspecified bacterial pneumonia: Secondary | ICD-10-CM | POA: Diagnosis not present

## 2023-06-21 DIAGNOSIS — R0902 Hypoxemia: Secondary | ICD-10-CM | POA: Diagnosis not present

## 2023-06-21 DIAGNOSIS — I5033 Acute on chronic diastolic (congestive) heart failure: Secondary | ICD-10-CM | POA: Diagnosis not present

## 2023-06-21 DIAGNOSIS — J189 Pneumonia, unspecified organism: Secondary | ICD-10-CM | POA: Diagnosis not present

## 2023-06-21 DIAGNOSIS — Z20822 Contact with and (suspected) exposure to covid-19: Secondary | ICD-10-CM | POA: Diagnosis not present

## 2023-06-21 DIAGNOSIS — I517 Cardiomegaly: Secondary | ICD-10-CM | POA: Diagnosis not present

## 2023-06-21 DIAGNOSIS — I3139 Other pericardial effusion (noninflammatory): Secondary | ICD-10-CM | POA: Diagnosis not present

## 2023-06-21 DIAGNOSIS — R0789 Other chest pain: Secondary | ICD-10-CM | POA: Diagnosis not present

## 2023-06-21 DIAGNOSIS — I2510 Atherosclerotic heart disease of native coronary artery without angina pectoris: Secondary | ICD-10-CM | POA: Diagnosis not present

## 2023-06-21 DIAGNOSIS — Z955 Presence of coronary angioplasty implant and graft: Secondary | ICD-10-CM | POA: Diagnosis not present

## 2023-06-21 DIAGNOSIS — R079 Chest pain, unspecified: Secondary | ICD-10-CM | POA: Diagnosis not present

## 2023-06-21 DIAGNOSIS — R918 Other nonspecific abnormal finding of lung field: Secondary | ICD-10-CM | POA: Diagnosis not present

## 2023-06-21 DIAGNOSIS — Z7982 Long term (current) use of aspirin: Secondary | ICD-10-CM | POA: Diagnosis not present

## 2023-06-21 DIAGNOSIS — I701 Atherosclerosis of renal artery: Secondary | ICD-10-CM | POA: Diagnosis not present

## 2023-06-21 DIAGNOSIS — D631 Anemia in chronic kidney disease: Secondary | ICD-10-CM | POA: Diagnosis not present

## 2023-06-21 DIAGNOSIS — R0602 Shortness of breath: Secondary | ICD-10-CM | POA: Diagnosis not present

## 2023-06-21 DIAGNOSIS — I120 Hypertensive chronic kidney disease with stage 5 chronic kidney disease or end stage renal disease: Secondary | ICD-10-CM | POA: Diagnosis not present

## 2023-06-21 DIAGNOSIS — I081 Rheumatic disorders of both mitral and tricuspid valves: Secondary | ICD-10-CM | POA: Diagnosis not present

## 2023-06-21 DIAGNOSIS — E871 Hypo-osmolality and hyponatremia: Secondary | ICD-10-CM | POA: Diagnosis not present

## 2023-06-21 DIAGNOSIS — J90 Pleural effusion, not elsewhere classified: Secondary | ICD-10-CM | POA: Diagnosis not present

## 2023-06-21 DIAGNOSIS — R9431 Abnormal electrocardiogram [ECG] [EKG]: Secondary | ICD-10-CM | POA: Diagnosis not present

## 2023-06-21 DIAGNOSIS — I509 Heart failure, unspecified: Secondary | ICD-10-CM | POA: Diagnosis not present

## 2023-06-21 DIAGNOSIS — R0989 Other specified symptoms and signs involving the circulatory and respiratory systems: Secondary | ICD-10-CM | POA: Diagnosis not present

## 2023-06-21 DIAGNOSIS — I2489 Other forms of acute ischemic heart disease: Secondary | ICD-10-CM | POA: Diagnosis not present

## 2023-06-21 DIAGNOSIS — E1122 Type 2 diabetes mellitus with diabetic chronic kidney disease: Secondary | ICD-10-CM | POA: Diagnosis not present

## 2023-06-21 DIAGNOSIS — Z79899 Other long term (current) drug therapy: Secondary | ICD-10-CM | POA: Diagnosis not present

## 2023-06-21 DIAGNOSIS — I700 Atherosclerosis of aorta: Secondary | ICD-10-CM | POA: Diagnosis not present

## 2023-06-21 DIAGNOSIS — I272 Pulmonary hypertension, unspecified: Secondary | ICD-10-CM | POA: Diagnosis not present

## 2023-06-21 DIAGNOSIS — Z794 Long term (current) use of insulin: Secondary | ICD-10-CM | POA: Diagnosis not present

## 2023-06-21 DIAGNOSIS — I252 Old myocardial infarction: Secondary | ICD-10-CM | POA: Diagnosis not present

## 2023-06-21 DIAGNOSIS — Z992 Dependence on renal dialysis: Secondary | ICD-10-CM | POA: Diagnosis not present

## 2023-06-21 DIAGNOSIS — Z905 Acquired absence of kidney: Secondary | ICD-10-CM | POA: Diagnosis not present

## 2023-06-21 DIAGNOSIS — I132 Hypertensive heart and chronic kidney disease with heart failure and with stage 5 chronic kidney disease, or end stage renal disease: Secondary | ICD-10-CM | POA: Diagnosis not present

## 2023-06-26 DIAGNOSIS — N186 End stage renal disease: Secondary | ICD-10-CM | POA: Diagnosis not present

## 2023-06-26 DIAGNOSIS — Z992 Dependence on renal dialysis: Secondary | ICD-10-CM | POA: Diagnosis not present

## 2023-06-28 DIAGNOSIS — N2581 Secondary hyperparathyroidism of renal origin: Secondary | ICD-10-CM | POA: Diagnosis not present

## 2023-06-28 DIAGNOSIS — N186 End stage renal disease: Secondary | ICD-10-CM | POA: Diagnosis not present

## 2023-06-28 DIAGNOSIS — D631 Anemia in chronic kidney disease: Secondary | ICD-10-CM | POA: Diagnosis not present

## 2023-06-28 DIAGNOSIS — D509 Iron deficiency anemia, unspecified: Secondary | ICD-10-CM | POA: Diagnosis not present

## 2023-06-30 ENCOUNTER — Encounter (INDEPENDENT_AMBULATORY_CARE_PROVIDER_SITE_OTHER): Payer: PPO | Admitting: Ophthalmology

## 2023-06-30 DIAGNOSIS — H43813 Vitreous degeneration, bilateral: Secondary | ICD-10-CM

## 2023-06-30 DIAGNOSIS — I10 Essential (primary) hypertension: Secondary | ICD-10-CM | POA: Diagnosis not present

## 2023-06-30 DIAGNOSIS — H34831 Tributary (branch) retinal vein occlusion, right eye, with macular edema: Secondary | ICD-10-CM

## 2023-06-30 DIAGNOSIS — H353211 Exudative age-related macular degeneration, right eye, with active choroidal neovascularization: Secondary | ICD-10-CM

## 2023-06-30 DIAGNOSIS — H35033 Hypertensive retinopathy, bilateral: Secondary | ICD-10-CM | POA: Diagnosis not present

## 2023-06-30 DIAGNOSIS — H353122 Nonexudative age-related macular degeneration, left eye, intermediate dry stage: Secondary | ICD-10-CM

## 2023-07-02 ENCOUNTER — Ambulatory Visit (INDEPENDENT_AMBULATORY_CARE_PROVIDER_SITE_OTHER): Payer: PPO

## 2023-07-02 DIAGNOSIS — Z89411 Acquired absence of right great toe: Secondary | ICD-10-CM

## 2023-07-02 DIAGNOSIS — M2041 Other hammer toe(s) (acquired), right foot: Secondary | ICD-10-CM

## 2023-07-02 DIAGNOSIS — E1140 Type 2 diabetes mellitus with diabetic neuropathy, unspecified: Secondary | ICD-10-CM

## 2023-07-02 DIAGNOSIS — I999 Unspecified disorder of circulatory system: Secondary | ICD-10-CM

## 2023-07-02 DIAGNOSIS — M2012 Hallux valgus (acquired), left foot: Secondary | ICD-10-CM | POA: Diagnosis not present

## 2023-07-02 DIAGNOSIS — M2140 Flat foot [pes planus] (acquired), unspecified foot: Secondary | ICD-10-CM

## 2023-07-02 DIAGNOSIS — E11621 Type 2 diabetes mellitus with foot ulcer: Secondary | ICD-10-CM

## 2023-07-02 NOTE — Progress Notes (Signed)
Patient presents today to pick up diabetic shoes and insoles.  Patient was dispensed 1 pair of diabetic shoes and 3 pairs of foam casted diabetic insoles. Fit was satisfactory. Instructions for break-in and wear was reviewed and a copy was given to the patient.   Re-appointment for regularly scheduled diabetic foot care visits or if they should experience any trouble with the shoes or insoles.  Patient was very happy with fit will wear and let us know if any problems arise  Addison Bailey Cped, CFo, CFm

## 2023-07-03 DIAGNOSIS — E1122 Type 2 diabetes mellitus with diabetic chronic kidney disease: Secondary | ICD-10-CM | POA: Diagnosis not present

## 2023-07-04 DIAGNOSIS — G2581 Restless legs syndrome: Secondary | ICD-10-CM | POA: Diagnosis not present

## 2023-07-04 DIAGNOSIS — Z794 Long term (current) use of insulin: Secondary | ICD-10-CM | POA: Diagnosis not present

## 2023-07-04 DIAGNOSIS — I2489 Other forms of acute ischemic heart disease: Secondary | ICD-10-CM | POA: Diagnosis not present

## 2023-07-04 DIAGNOSIS — I5033 Acute on chronic diastolic (congestive) heart failure: Secondary | ICD-10-CM | POA: Diagnosis not present

## 2023-07-04 DIAGNOSIS — R0789 Other chest pain: Secondary | ICD-10-CM | POA: Diagnosis not present

## 2023-07-04 DIAGNOSIS — I2510 Atherosclerotic heart disease of native coronary artery without angina pectoris: Secondary | ICD-10-CM | POA: Diagnosis not present

## 2023-07-04 DIAGNOSIS — R0902 Hypoxemia: Secondary | ICD-10-CM | POA: Diagnosis not present

## 2023-07-04 DIAGNOSIS — J9601 Acute respiratory failure with hypoxia: Secondary | ICD-10-CM | POA: Diagnosis not present

## 2023-07-04 DIAGNOSIS — D3500 Benign neoplasm of unspecified adrenal gland: Secondary | ICD-10-CM | POA: Diagnosis not present

## 2023-07-04 DIAGNOSIS — Z7409 Other reduced mobility: Secondary | ICD-10-CM | POA: Diagnosis not present

## 2023-07-04 DIAGNOSIS — Z79899 Other long term (current) drug therapy: Secondary | ICD-10-CM | POA: Diagnosis not present

## 2023-07-04 DIAGNOSIS — E1122 Type 2 diabetes mellitus with diabetic chronic kidney disease: Secondary | ICD-10-CM | POA: Diagnosis not present

## 2023-07-04 DIAGNOSIS — I252 Old myocardial infarction: Secondary | ICD-10-CM | POA: Diagnosis not present

## 2023-07-04 DIAGNOSIS — R197 Diarrhea, unspecified: Secondary | ICD-10-CM | POA: Diagnosis not present

## 2023-07-04 DIAGNOSIS — R918 Other nonspecific abnormal finding of lung field: Secondary | ICD-10-CM | POA: Diagnosis not present

## 2023-07-04 DIAGNOSIS — I509 Heart failure, unspecified: Secondary | ICD-10-CM | POA: Diagnosis not present

## 2023-07-04 DIAGNOSIS — I110 Hypertensive heart disease with heart failure: Secondary | ICD-10-CM | POA: Diagnosis not present

## 2023-07-04 DIAGNOSIS — R079 Chest pain, unspecified: Secondary | ICD-10-CM | POA: Diagnosis not present

## 2023-07-04 DIAGNOSIS — D631 Anemia in chronic kidney disease: Secondary | ICD-10-CM | POA: Diagnosis not present

## 2023-07-04 DIAGNOSIS — J90 Pleural effusion, not elsewhere classified: Secondary | ICD-10-CM | POA: Diagnosis not present

## 2023-07-04 DIAGNOSIS — R0603 Acute respiratory distress: Secondary | ICD-10-CM | POA: Diagnosis not present

## 2023-07-04 DIAGNOSIS — I120 Hypertensive chronic kidney disease with stage 5 chronic kidney disease or end stage renal disease: Secondary | ICD-10-CM | POA: Diagnosis not present

## 2023-07-04 DIAGNOSIS — Z8701 Personal history of pneumonia (recurrent): Secondary | ICD-10-CM | POA: Diagnosis not present

## 2023-07-04 DIAGNOSIS — I10 Essential (primary) hypertension: Secondary | ICD-10-CM | POA: Diagnosis not present

## 2023-07-04 DIAGNOSIS — Z955 Presence of coronary angioplasty implant and graft: Secondary | ICD-10-CM | POA: Diagnosis not present

## 2023-07-04 DIAGNOSIS — D72829 Elevated white blood cell count, unspecified: Secondary | ICD-10-CM | POA: Diagnosis not present

## 2023-07-04 DIAGNOSIS — I517 Cardiomegaly: Secondary | ICD-10-CM | POA: Diagnosis not present

## 2023-07-04 DIAGNOSIS — N186 End stage renal disease: Secondary | ICD-10-CM | POA: Diagnosis not present

## 2023-07-04 DIAGNOSIS — K219 Gastro-esophageal reflux disease without esophagitis: Secondary | ICD-10-CM | POA: Diagnosis not present

## 2023-07-04 DIAGNOSIS — I700 Atherosclerosis of aorta: Secondary | ICD-10-CM | POA: Diagnosis not present

## 2023-07-04 DIAGNOSIS — M25519 Pain in unspecified shoulder: Secondary | ICD-10-CM | POA: Diagnosis not present

## 2023-07-04 DIAGNOSIS — Z992 Dependence on renal dialysis: Secondary | ICD-10-CM | POA: Diagnosis not present

## 2023-07-04 DIAGNOSIS — I132 Hypertensive heart and chronic kidney disease with heart failure and with stage 5 chronic kidney disease, or end stage renal disease: Secondary | ICD-10-CM | POA: Diagnosis not present

## 2023-07-04 DIAGNOSIS — D367 Benign neoplasm of other specified sites: Secondary | ICD-10-CM | POA: Diagnosis not present

## 2023-07-04 DIAGNOSIS — R9431 Abnormal electrocardiogram [ECG] [EKG]: Secondary | ICD-10-CM | POA: Diagnosis not present

## 2023-07-04 DIAGNOSIS — E7800 Pure hypercholesterolemia, unspecified: Secondary | ICD-10-CM | POA: Diagnosis not present

## 2023-07-09 DIAGNOSIS — N179 Acute kidney failure, unspecified: Secondary | ICD-10-CM | POA: Diagnosis not present

## 2023-07-09 DIAGNOSIS — Z794 Long term (current) use of insulin: Secondary | ICD-10-CM | POA: Diagnosis not present

## 2023-07-09 DIAGNOSIS — E079 Disorder of thyroid, unspecified: Secondary | ICD-10-CM | POA: Diagnosis not present

## 2023-07-09 DIAGNOSIS — G4733 Obstructive sleep apnea (adult) (pediatric): Secondary | ICD-10-CM | POA: Diagnosis not present

## 2023-07-09 DIAGNOSIS — I132 Hypertensive heart and chronic kidney disease with heart failure and with stage 5 chronic kidney disease, or end stage renal disease: Secondary | ICD-10-CM | POA: Diagnosis not present

## 2023-07-09 DIAGNOSIS — J302 Other seasonal allergic rhinitis: Secondary | ICD-10-CM | POA: Diagnosis not present

## 2023-07-09 DIAGNOSIS — N186 End stage renal disease: Secondary | ICD-10-CM | POA: Diagnosis not present

## 2023-07-09 DIAGNOSIS — Z992 Dependence on renal dialysis: Secondary | ICD-10-CM | POA: Diagnosis not present

## 2023-07-09 DIAGNOSIS — R918 Other nonspecific abnormal finding of lung field: Secondary | ICD-10-CM | POA: Diagnosis not present

## 2023-07-09 DIAGNOSIS — I2510 Atherosclerotic heart disease of native coronary artery without angina pectoris: Secondary | ICD-10-CM | POA: Diagnosis not present

## 2023-07-09 DIAGNOSIS — D72829 Elevated white blood cell count, unspecified: Secondary | ICD-10-CM | POA: Diagnosis not present

## 2023-07-09 DIAGNOSIS — J181 Lobar pneumonia, unspecified organism: Secondary | ICD-10-CM | POA: Diagnosis not present

## 2023-07-09 DIAGNOSIS — Z556 Problems related to health literacy: Secondary | ICD-10-CM | POA: Diagnosis not present

## 2023-07-09 DIAGNOSIS — D631 Anemia in chronic kidney disease: Secondary | ICD-10-CM | POA: Diagnosis not present

## 2023-07-09 DIAGNOSIS — I5033 Acute on chronic diastolic (congestive) heart failure: Secondary | ICD-10-CM | POA: Diagnosis not present

## 2023-07-09 DIAGNOSIS — E7800 Pure hypercholesterolemia, unspecified: Secondary | ICD-10-CM | POA: Diagnosis not present

## 2023-07-09 DIAGNOSIS — M109 Gout, unspecified: Secondary | ICD-10-CM | POA: Diagnosis not present

## 2023-07-09 DIAGNOSIS — Z955 Presence of coronary angioplasty implant and graft: Secondary | ICD-10-CM | POA: Diagnosis not present

## 2023-07-09 DIAGNOSIS — I700 Atherosclerosis of aorta: Secondary | ICD-10-CM | POA: Diagnosis not present

## 2023-07-09 DIAGNOSIS — E1122 Type 2 diabetes mellitus with diabetic chronic kidney disease: Secondary | ICD-10-CM | POA: Diagnosis not present

## 2023-07-09 DIAGNOSIS — Z7982 Long term (current) use of aspirin: Secondary | ICD-10-CM | POA: Diagnosis not present

## 2023-07-09 DIAGNOSIS — K449 Diaphragmatic hernia without obstruction or gangrene: Secondary | ICD-10-CM | POA: Diagnosis not present

## 2023-07-09 DIAGNOSIS — K219 Gastro-esophageal reflux disease without esophagitis: Secondary | ICD-10-CM | POA: Diagnosis not present

## 2023-07-16 DIAGNOSIS — I509 Heart failure, unspecified: Secondary | ICD-10-CM | POA: Diagnosis not present

## 2023-07-16 DIAGNOSIS — Z6826 Body mass index (BMI) 26.0-26.9, adult: Secondary | ICD-10-CM | POA: Diagnosis not present

## 2023-07-21 DIAGNOSIS — I5033 Acute on chronic diastolic (congestive) heart failure: Secondary | ICD-10-CM | POA: Diagnosis not present

## 2023-07-21 DIAGNOSIS — I252 Old myocardial infarction: Secondary | ICD-10-CM | POA: Diagnosis not present

## 2023-07-21 DIAGNOSIS — Z794 Long term (current) use of insulin: Secondary | ICD-10-CM | POA: Diagnosis not present

## 2023-07-21 DIAGNOSIS — E7800 Pure hypercholesterolemia, unspecified: Secondary | ICD-10-CM | POA: Diagnosis not present

## 2023-07-21 DIAGNOSIS — I2510 Atherosclerotic heart disease of native coronary artery without angina pectoris: Secondary | ICD-10-CM | POA: Diagnosis not present

## 2023-07-21 DIAGNOSIS — I498 Other specified cardiac arrhythmias: Secondary | ICD-10-CM | POA: Diagnosis not present

## 2023-07-21 DIAGNOSIS — E1159 Type 2 diabetes mellitus with other circulatory complications: Secondary | ICD-10-CM | POA: Diagnosis not present

## 2023-07-21 DIAGNOSIS — E8779 Other fluid overload: Secondary | ICD-10-CM | POA: Diagnosis not present

## 2023-07-21 DIAGNOSIS — I120 Hypertensive chronic kidney disease with stage 5 chronic kidney disease or end stage renal disease: Secondary | ICD-10-CM | POA: Diagnosis not present

## 2023-07-21 DIAGNOSIS — N185 Chronic kidney disease, stage 5: Secondary | ICD-10-CM | POA: Diagnosis not present

## 2023-07-26 DIAGNOSIS — N186 End stage renal disease: Secondary | ICD-10-CM | POA: Diagnosis not present

## 2023-07-26 DIAGNOSIS — Z992 Dependence on renal dialysis: Secondary | ICD-10-CM | POA: Diagnosis not present

## 2023-07-28 ENCOUNTER — Ambulatory Visit (INDEPENDENT_AMBULATORY_CARE_PROVIDER_SITE_OTHER): Payer: PPO | Admitting: Podiatry

## 2023-07-28 ENCOUNTER — Encounter: Payer: Self-pay | Admitting: Podiatry

## 2023-07-28 ENCOUNTER — Encounter (INDEPENDENT_AMBULATORY_CARE_PROVIDER_SITE_OTHER): Payer: PPO | Admitting: Ophthalmology

## 2023-07-28 DIAGNOSIS — H35033 Hypertensive retinopathy, bilateral: Secondary | ICD-10-CM

## 2023-07-28 DIAGNOSIS — H34831 Tributary (branch) retinal vein occlusion, right eye, with macular edema: Secondary | ICD-10-CM | POA: Diagnosis not present

## 2023-07-28 DIAGNOSIS — B351 Tinea unguium: Secondary | ICD-10-CM | POA: Diagnosis not present

## 2023-07-28 DIAGNOSIS — H353231 Exudative age-related macular degeneration, bilateral, with active choroidal neovascularization: Secondary | ICD-10-CM | POA: Diagnosis not present

## 2023-07-28 DIAGNOSIS — I10 Essential (primary) hypertension: Secondary | ICD-10-CM

## 2023-07-28 DIAGNOSIS — E1140 Type 2 diabetes mellitus with diabetic neuropathy, unspecified: Secondary | ICD-10-CM | POA: Diagnosis not present

## 2023-07-28 DIAGNOSIS — H43813 Vitreous degeneration, bilateral: Secondary | ICD-10-CM

## 2023-07-28 NOTE — Progress Notes (Signed)
  Subjective:  Patient ID: Leah Walsh, female    DOB: 21-Oct-1940,  MRN: [id number]  Chief Complaint  Patient presents with   Diabetes    "Check my feet.  I got my new shoes."     82 y.o. female returns for post-op check.  Nails are thickened and elongated again, her shoes have been comfortable  Review of Systems: Negative except as noted in the HPI. Denies N/V/F/Ch.   Objective:  There were no vitals filed for this visit. There is no height or weight on file to calculate BMI. Constitutional Well developed. Well nourished.  Vascular Foot warm and well perfused. Capillary refill normal to all digits.  Calf is soft and supple, no posterior calf or knee pain, negative Homans' sign  Neurologic Normal speech. Oriented to person, place, and time. Epicritic sensation to light touch grossly absent bilaterally.  Dermatologic Multiple nails are dystrophic thickened elongated yellow discolored and mycotic x 9  Orthopedic: She has no tenderness to palpation noted about the surgical site.  Hammertoe right second toe that is reducible.  She has hallux valgus on the left    Assessment:   1. Onychomycosis   2. Type 2 diabetes mellitus with diabetic neuropathy, unspecified whether long term insulin use (HCC)     Plan:  Patient was evaluated and treated and all questions answered.  S/p foot surgery right Doing well her painful mycotic nails were debrided in length and thickness today with a sharp nail nipper to a tolerable level.  Due to her diabetes and polyneuropathy this is medically necessary.  Her contracted right second toe likely will also benefit from a flexor tenotomy to reduce the contracture reduce risk of recurrence and ulcer.  She would like to wait until after the holidays for this.  She has an upcoming A1c check in the next couple weeks she will let me know with the level as long as it is 7.5% or lower we may proceed with tenotomy.  Return in about 5 weeks (around 09/01/2023) for  right 2nd toe tenotomy.

## 2023-07-29 DIAGNOSIS — N2581 Secondary hyperparathyroidism of renal origin: Secondary | ICD-10-CM | POA: Diagnosis not present

## 2023-07-29 DIAGNOSIS — D509 Iron deficiency anemia, unspecified: Secondary | ICD-10-CM | POA: Diagnosis not present

## 2023-07-29 DIAGNOSIS — N186 End stage renal disease: Secondary | ICD-10-CM | POA: Diagnosis not present

## 2023-07-29 DIAGNOSIS — D631 Anemia in chronic kidney disease: Secondary | ICD-10-CM | POA: Diagnosis not present

## 2023-07-31 DIAGNOSIS — I5033 Acute on chronic diastolic (congestive) heart failure: Secondary | ICD-10-CM | POA: Diagnosis not present

## 2023-07-31 DIAGNOSIS — I132 Hypertensive heart and chronic kidney disease with heart failure and with stage 5 chronic kidney disease, or end stage renal disease: Secondary | ICD-10-CM | POA: Diagnosis not present

## 2023-07-31 DIAGNOSIS — E1122 Type 2 diabetes mellitus with diabetic chronic kidney disease: Secondary | ICD-10-CM | POA: Diagnosis not present

## 2023-08-06 DIAGNOSIS — I2510 Atherosclerotic heart disease of native coronary artery without angina pectoris: Secondary | ICD-10-CM | POA: Diagnosis not present

## 2023-08-06 DIAGNOSIS — E1159 Type 2 diabetes mellitus with other circulatory complications: Secondary | ICD-10-CM | POA: Diagnosis not present

## 2023-08-06 DIAGNOSIS — E7800 Pure hypercholesterolemia, unspecified: Secondary | ICD-10-CM | POA: Diagnosis not present

## 2023-08-06 DIAGNOSIS — Z794 Long term (current) use of insulin: Secondary | ICD-10-CM | POA: Diagnosis not present

## 2023-08-09 NOTE — Progress Notes (Signed)
HPI female never smoker followed for OSA, complicated by CAD/MI/stent, asthma (Dr. Lucie Leather), DM 2, CKD4 NPSG 12/05/10([place]) AHI 14.2/ hr, weight 178 lbs.  PFT 11/07/14-  Moderate Diffusion deficit 60%. Normal flows and lung volumes with insignificant response to bronchodilator. a1AT- 10/07/14-  Nl MM 123 OK  --------------------------------------------------------------------------------------------   12/20/22-  82 year old female never smoker followed for OSA, Insomnia, complicated by CAD/MI/stent, Chronic Diastolic dCHF, Asthma,  DM 2, HTN, CKD5, Anemia, GERD, ESRD/Hemodialysis, -Symbicort 80, albuterol HFA, also has Flovent HFA 110 CPAP auto 5-15/ [facility]/ [facility]      AirSense 11 AutoSet Download compliance-57%, AHI 0.9/hr Body weight today-181 lbs -----Pt is doing okay. Pt has been having trouble sleeping -going to sleep and waking up a lot Download reviewed.  CPAP every night with good control but insomnia is interfering with amount of time she uses it.  We discussed sleep habits and management of insomnia.  We are going to try trazodone. Uses rescue inhaler occasionally.  Has not felt need for maintenance inhaler at all. Toe amputated in January, still wearing orthopedic boot-slow to heal. Now on hemodialysis with access left antecubital space.  08/11/23- 82 year old female never smoker followed for OSA, Insomnia, complicated by CAD/MI/stent, Chronic Diastolic dCHF, Asthma,  DM 2, HTN, CKD5, Anemia, GERD, ESRD/Hemodialysis,  albuterol HFA, Singulair,  CPAP auto 5-15/ [facility]/ [facility]      AirSense 11 AutoSet Download compliance- 83%, AHI 0.6/hr Body weight today-173 lbs [facility] November [facility]- Acute Hypoxic respiratory Failure, CHF -----Hospitalized [facility] for pneumonia end of November, then again for CHF first week of December.  No sx noted today. Download reviewed. Doing well with CPAP. Started hemodialysis.  Breathing is good now. Had flu vax.  ROS see HPI     + = positive  Constitutional:   No-   weight loss, night sweats, fevers, chills, fatigue, lassitude. HEENT:   No-  headaches, difficulty swallowing, tooth/dental problems, sore throat,       No-  sneezing, itching, ear ache, + nasal congestion, post nasal drip,  CV:  No-   chest pain, orthopnea, PND, + swelling in lower extremities, anasarca,                                            dizziness, + palpitations Resp: No-   shortness of breath with exertion or at rest.              No-   productive cough,  No non-productive cough,  No- coughing up of blood.              No-   change in color of mucus.  Wheezing+ occasional Skin: No-   rash or lesions. GI:  No-   heartburn, + indigestion, abdominal pain, nausea, vomiting,  GU:  MS:  + joint pain or swelling.   Neuro-     nothing unusual Psych:  No- change in mood or affect. No depression or anxiety.  No memory loss.  OBJ- Physical Exam General- Alert, Oriented, Affect-appropriate, Distress- none acute, not obese Skin- rash-none, lesions- none, excoriation- none Lymphadenopathy- none Head- atraumatic            Eyes- Gross vision intact, PERRLA, conjunctivae and secretions clear            Ears- Hearing, canals-normal            Nose- Clear,  no-Septal dev, mucus, polyps, erosion, perforation             Throat- Mallampati III , mucosa clear , drainage- none, tonsils- atrophic Neck- flexible , trachea midline, no stridor , thyroid nl, carotid no bruit Chest - symmetrical excursion , unlabored           Heart/CV- RRR , no murmur , no gallop  , no rub, nl s1 s2                           - JVD- none , edema- none, stasis changes- none, varices- none           Lung- clear to P&A, wheeze- none, cough- none , dullness+ minimal at bases, rub- none           Chest wall-  Abd-  Br/ Gen/ Rectal- Not done, not indicated Extrem- + dialysis Access left arm,  Neuro- grossly intact to observation

## 2023-08-11 ENCOUNTER — Ambulatory Visit: Payer: PPO | Admitting: Internal Medicine

## 2023-08-11 ENCOUNTER — Encounter: Payer: Self-pay | Admitting: Internal Medicine

## 2023-08-11 VITALS — BP 124/54 | HR 68 | Temp 98.0°F | Ht 68.0 in | Wt 173.2 lb

## 2023-08-11 DIAGNOSIS — G4733 Obstructive sleep apnea (adult) (pediatric): Secondary | ICD-10-CM

## 2023-08-11 DIAGNOSIS — N184 Chronic kidney disease, stage 4 (severe): Secondary | ICD-10-CM

## 2023-08-11 DIAGNOSIS — I5032 Chronic diastolic (congestive) heart failure: Secondary | ICD-10-CM

## 2023-08-11 DIAGNOSIS — J4530 Mild persistent asthma, uncomplicated: Secondary | ICD-10-CM

## 2023-08-11 NOTE — Patient Instructions (Signed)
Order DME [employer]- please continue CPAP auto 5-15, mask of choice, humidifier, supplies, AirView/ card  Please call if we can help

## 2023-08-26 DIAGNOSIS — Z992 Dependence on renal dialysis: Secondary | ICD-10-CM | POA: Diagnosis not present

## 2023-08-26 DIAGNOSIS — N186 End stage renal disease: Secondary | ICD-10-CM | POA: Diagnosis not present

## 2023-08-28 DIAGNOSIS — N186 End stage renal disease: Secondary | ICD-10-CM | POA: Diagnosis not present

## 2023-08-28 DIAGNOSIS — D631 Anemia in chronic kidney disease: Secondary | ICD-10-CM | POA: Diagnosis not present

## 2023-08-28 DIAGNOSIS — D509 Iron deficiency anemia, unspecified: Secondary | ICD-10-CM | POA: Diagnosis not present

## 2023-08-28 DIAGNOSIS — N2581 Secondary hyperparathyroidism of renal origin: Secondary | ICD-10-CM | POA: Diagnosis not present

## 2023-08-29 ENCOUNTER — Encounter (INDEPENDENT_AMBULATORY_CARE_PROVIDER_SITE_OTHER): Payer: PPO | Admitting: Ophthalmology

## 2023-09-01 ENCOUNTER — Encounter (INDEPENDENT_AMBULATORY_CARE_PROVIDER_SITE_OTHER): Payer: PPO | Admitting: Ophthalmology

## 2023-09-01 DIAGNOSIS — I10 Essential (primary) hypertension: Secondary | ICD-10-CM | POA: Diagnosis not present

## 2023-09-01 DIAGNOSIS — H353122 Nonexudative age-related macular degeneration, left eye, intermediate dry stage: Secondary | ICD-10-CM | POA: Diagnosis not present

## 2023-09-01 DIAGNOSIS — H43813 Vitreous degeneration, bilateral: Secondary | ICD-10-CM | POA: Diagnosis not present

## 2023-09-01 DIAGNOSIS — H34831 Tributary (branch) retinal vein occlusion, right eye, with macular edema: Secondary | ICD-10-CM

## 2023-09-01 DIAGNOSIS — H35033 Hypertensive retinopathy, bilateral: Secondary | ICD-10-CM

## 2023-09-01 DIAGNOSIS — H353211 Exudative age-related macular degeneration, right eye, with active choroidal neovascularization: Secondary | ICD-10-CM

## 2023-09-02 DIAGNOSIS — Z1159 Encounter for screening for other viral diseases: Secondary | ICD-10-CM | POA: Diagnosis not present

## 2023-09-04 DIAGNOSIS — E1122 Type 2 diabetes mellitus with diabetic chronic kidney disease: Secondary | ICD-10-CM | POA: Diagnosis not present

## 2023-09-08 ENCOUNTER — Ambulatory Visit: Payer: PPO | Admitting: Podiatry

## 2023-09-08 ENCOUNTER — Encounter: Payer: Self-pay | Admitting: Podiatry

## 2023-09-08 VITALS — Ht 68.0 in | Wt 173.2 lb

## 2023-09-08 DIAGNOSIS — M2041 Other hammer toe(s) (acquired), right foot: Secondary | ICD-10-CM

## 2023-09-08 NOTE — Progress Notes (Signed)
  Subjective:  Patient ID: Leah Walsh, female    DOB: [birth year],  MRN: [id number]  Chief Complaint  Patient presents with    2nd toe tenotomy.     She is here for the 2nd tenotomy. My A1C is 7.2      83 y.o. female returns for post-op check.  She returns for follow-up she is doing well her A1c has improved  Review of Systems: Negative except as noted in the HPI. Denies N/V/F/Ch.   Objective:  There were no vitals filed for this visit. Body mass index is 26.33 kg/m. Constitutional Well developed. Well nourished.  Vascular Foot warm and well perfused. Capillary refill normal to all digits.  Calf is soft and supple, no posterior calf or knee pain, negative Homans' sign  Neurologic Normal speech. Oriented to person, place, and time. Epicritic sensation to light touch grossly absent bilaterally.  Dermatologic Multiple nails are dystrophic thickened elongated yellow discolored and mycotic x 9  Orthopedic: She has no tenderness to palpation noted about the surgical site.  Hammertoe right second toe that is reducible.  She has hallux valgus on the left    Assessment:   1. Hammertoe of right foot     Plan:  Patient was evaluated and treated and all questions answered.  We again discussed flexor tenotomy to treat her hammertoe contracture and reduce her risk of future ulceration.  We discussed the risks and benefits.  Following consent the digit was anesthetized with lidocaine and Marcaine.  It was prepared with Betadine and aseptic technique and tourniquet secured around the base of the toe.  #11 blade was used to transect the long flexor tendon at the level of the middle phalanx and the digit was dorsiflexed to reduce the contracture.  She tolerated this well.  It was dressed with Silvadene and a compression bandage.  Post care instructions given she will remove the dressing tomorrow evening and apply Neosporin and a Band-Aid for the next 2 to 3 weeks.  Follow-up with me in 1 month  to reevaluate.  We will take new x-rays at that time.  Return in about 1 month (around 10/09/2023) for post op (new x-rays).

## 2023-09-17 NOTE — Assessment & Plan Note (Signed)
Benefits from CPAP with good compliance and control Plan- continue auto 5-15 

## 2023-09-17 NOTE — Assessment & Plan Note (Signed)
ESRD on hemodialysis.

## 2023-09-17 NOTE — Assessment & Plan Note (Signed)
Managed by cardiology 

## 2023-09-22 DIAGNOSIS — J189 Pneumonia, unspecified organism: Secondary | ICD-10-CM | POA: Diagnosis not present

## 2023-09-26 DIAGNOSIS — N186 End stage renal disease: Secondary | ICD-10-CM | POA: Diagnosis not present

## 2023-09-26 DIAGNOSIS — Z992 Dependence on renal dialysis: Secondary | ICD-10-CM | POA: Diagnosis not present

## 2023-09-27 DIAGNOSIS — D631 Anemia in chronic kidney disease: Secondary | ICD-10-CM | POA: Diagnosis not present

## 2023-09-27 DIAGNOSIS — N2581 Secondary hyperparathyroidism of renal origin: Secondary | ICD-10-CM | POA: Diagnosis not present

## 2023-09-27 DIAGNOSIS — D509 Iron deficiency anemia, unspecified: Secondary | ICD-10-CM | POA: Diagnosis not present

## 2023-09-27 DIAGNOSIS — N186 End stage renal disease: Secondary | ICD-10-CM | POA: Diagnosis not present

## 2023-10-02 DIAGNOSIS — E1122 Type 2 diabetes mellitus with diabetic chronic kidney disease: Secondary | ICD-10-CM | POA: Diagnosis not present

## 2023-10-03 ENCOUNTER — Encounter (INDEPENDENT_AMBULATORY_CARE_PROVIDER_SITE_OTHER): Payer: PPO | Admitting: Ophthalmology

## 2023-10-03 DIAGNOSIS — H34831 Tributary (branch) retinal vein occlusion, right eye, with macular edema: Secondary | ICD-10-CM | POA: Diagnosis not present

## 2023-10-03 DIAGNOSIS — H353122 Nonexudative age-related macular degeneration, left eye, intermediate dry stage: Secondary | ICD-10-CM

## 2023-10-03 DIAGNOSIS — H35033 Hypertensive retinopathy, bilateral: Secondary | ICD-10-CM

## 2023-10-03 DIAGNOSIS — I10 Essential (primary) hypertension: Secondary | ICD-10-CM

## 2023-10-03 DIAGNOSIS — H353211 Exudative age-related macular degeneration, right eye, with active choroidal neovascularization: Secondary | ICD-10-CM | POA: Diagnosis not present

## 2023-10-03 DIAGNOSIS — H43813 Vitreous degeneration, bilateral: Secondary | ICD-10-CM

## 2023-10-06 ENCOUNTER — Ambulatory Visit: Payer: PPO

## 2023-10-06 ENCOUNTER — Ambulatory Visit (INDEPENDENT_AMBULATORY_CARE_PROVIDER_SITE_OTHER): Payer: PPO

## 2023-10-06 ENCOUNTER — Encounter: Payer: Self-pay | Admitting: Podiatry

## 2023-10-06 ENCOUNTER — Ambulatory Visit (INDEPENDENT_AMBULATORY_CARE_PROVIDER_SITE_OTHER): Payer: PPO | Admitting: Podiatry

## 2023-10-06 DIAGNOSIS — M2041 Other hammer toe(s) (acquired), right foot: Secondary | ICD-10-CM

## 2023-10-06 NOTE — Progress Notes (Signed)
  Subjective:  Patient ID: Leah Walsh, female    DOB: 03/29/1941,  MRN: [id number]  Chief Complaint  Patient presents with   Routine Post Op    Pt is here for routine post for right foot, pt states her foot is fine and has no complaints.    DOS: 09/08/2023 Procedure: Right second toe flexor tenotomy  83 y.o. female returns for post-op check.  She notes it is doing well and is nearly fully healed  Review of Systems: Negative except as noted in the HPI. Denies N/V/F/Ch.   Objective:  There were no vitals filed for this visit. Body mass index is 26.33 kg/m. Constitutional Well developed. Well nourished.  Vascular Foot warm and well perfused. Capillary refill normal to all digits.  Calf is soft and supple, no posterior calf or knee pain, negative Homans' sign  Neurologic Normal speech. Oriented to person, place, and time. Epicritic sensation to light touch grossly reduced bilaterally.  Dermatologic Skin healing well without signs of infection. Skin edges well coapted without signs of infection.  Orthopedic: No pain to palpation noted about the surgical site.   Multiple view plain film radiographs: Improved appearance of toe alignment Assessment:   1. Hammertoe of right foot    Plan:  Patient was evaluated and treated and all questions answered.  S/p foot surgery right -Progressing as expected post-operatively.  Second toe is nearly fully healed.  May leave open to air and apply lotion or moisturizing ointment. -She also received denial of benefits for diabetic shoes from last year, her insurance told her that we did not complete the paperwork correctly but said it was medically necessary.  We will look into this for her.  Return if symptoms worsen or fail to improve.

## 2023-10-07 ENCOUNTER — Encounter: Payer: Self-pay | Admitting: Podiatry

## 2023-10-08 DIAGNOSIS — Z6826 Body mass index (BMI) 26.0-26.9, adult: Secondary | ICD-10-CM | POA: Diagnosis not present

## 2023-10-08 DIAGNOSIS — Z89429 Acquired absence of other toe(s), unspecified side: Secondary | ICD-10-CM | POA: Diagnosis not present

## 2023-10-08 DIAGNOSIS — R2689 Other abnormalities of gait and mobility: Secondary | ICD-10-CM | POA: Diagnosis not present

## 2023-10-14 DIAGNOSIS — Z114 Encounter for screening for human immunodeficiency virus [HIV]: Secondary | ICD-10-CM | POA: Diagnosis not present

## 2023-10-20 DIAGNOSIS — E119 Type 2 diabetes mellitus without complications: Secondary | ICD-10-CM | POA: Diagnosis not present

## 2023-10-20 DIAGNOSIS — I2510 Atherosclerotic heart disease of native coronary artery without angina pectoris: Secondary | ICD-10-CM | POA: Diagnosis not present

## 2023-10-20 DIAGNOSIS — Z794 Long term (current) use of insulin: Secondary | ICD-10-CM | POA: Diagnosis not present

## 2023-10-20 DIAGNOSIS — D631 Anemia in chronic kidney disease: Secondary | ICD-10-CM | POA: Diagnosis not present

## 2023-10-20 DIAGNOSIS — I132 Hypertensive heart and chronic kidney disease with heart failure and with stage 5 chronic kidney disease, or end stage renal disease: Secondary | ICD-10-CM | POA: Diagnosis not present

## 2023-10-20 DIAGNOSIS — Z955 Presence of coronary angioplasty implant and graft: Secondary | ICD-10-CM | POA: Diagnosis not present

## 2023-10-20 DIAGNOSIS — I701 Atherosclerosis of renal artery: Secondary | ICD-10-CM | POA: Diagnosis not present

## 2023-10-20 DIAGNOSIS — I252 Old myocardial infarction: Secondary | ICD-10-CM | POA: Diagnosis not present

## 2023-10-20 DIAGNOSIS — I5033 Acute on chronic diastolic (congestive) heart failure: Secondary | ICD-10-CM | POA: Diagnosis not present

## 2023-10-20 DIAGNOSIS — N185 Chronic kidney disease, stage 5: Secondary | ICD-10-CM | POA: Diagnosis not present

## 2023-10-24 DIAGNOSIS — N186 End stage renal disease: Secondary | ICD-10-CM | POA: Diagnosis not present

## 2023-10-24 DIAGNOSIS — Z992 Dependence on renal dialysis: Secondary | ICD-10-CM | POA: Diagnosis not present

## 2023-10-25 DIAGNOSIS — N186 End stage renal disease: Secondary | ICD-10-CM | POA: Diagnosis not present

## 2023-10-25 DIAGNOSIS — D631 Anemia in chronic kidney disease: Secondary | ICD-10-CM | POA: Diagnosis not present

## 2023-10-25 DIAGNOSIS — N2581 Secondary hyperparathyroidism of renal origin: Secondary | ICD-10-CM | POA: Diagnosis not present

## 2023-10-25 DIAGNOSIS — D509 Iron deficiency anemia, unspecified: Secondary | ICD-10-CM | POA: Diagnosis not present

## 2023-10-27 DIAGNOSIS — H524 Presbyopia: Secondary | ICD-10-CM | POA: Diagnosis not present

## 2023-10-27 DIAGNOSIS — E113391 Type 2 diabetes mellitus with moderate nonproliferative diabetic retinopathy without macular edema, right eye: Secondary | ICD-10-CM | POA: Diagnosis not present

## 2023-10-27 DIAGNOSIS — H353211 Exudative age-related macular degeneration, right eye, with active choroidal neovascularization: Secondary | ICD-10-CM | POA: Diagnosis not present

## 2023-10-27 DIAGNOSIS — H40013 Open angle with borderline findings, low risk, bilateral: Secondary | ICD-10-CM | POA: Diagnosis not present

## 2023-10-27 DIAGNOSIS — H353122 Nonexudative age-related macular degeneration, left eye, intermediate dry stage: Secondary | ICD-10-CM | POA: Diagnosis not present

## 2023-10-30 DIAGNOSIS — E1122 Type 2 diabetes mellitus with diabetic chronic kidney disease: Secondary | ICD-10-CM | POA: Diagnosis not present

## 2023-10-31 ENCOUNTER — Encounter (INDEPENDENT_AMBULATORY_CARE_PROVIDER_SITE_OTHER): Payer: PPO | Admitting: Ophthalmology

## 2023-10-31 DIAGNOSIS — H34831 Tributary (branch) retinal vein occlusion, right eye, with macular edema: Secondary | ICD-10-CM | POA: Diagnosis not present

## 2023-10-31 DIAGNOSIS — H35033 Hypertensive retinopathy, bilateral: Secondary | ICD-10-CM

## 2023-10-31 DIAGNOSIS — H43813 Vitreous degeneration, bilateral: Secondary | ICD-10-CM | POA: Diagnosis not present

## 2023-10-31 DIAGNOSIS — I10 Essential (primary) hypertension: Secondary | ICD-10-CM

## 2023-10-31 DIAGNOSIS — H353122 Nonexudative age-related macular degeneration, left eye, intermediate dry stage: Secondary | ICD-10-CM

## 2023-10-31 DIAGNOSIS — H353211 Exudative age-related macular degeneration, right eye, with active choroidal neovascularization: Secondary | ICD-10-CM | POA: Diagnosis not present

## 2023-11-03 ENCOUNTER — Ambulatory Visit: Payer: PPO | Admitting: Podiatry

## 2023-11-07 DIAGNOSIS — R2681 Unsteadiness on feet: Secondary | ICD-10-CM | POA: Diagnosis not present

## 2023-11-07 DIAGNOSIS — M6281 Muscle weakness (generalized): Secondary | ICD-10-CM | POA: Diagnosis not present

## 2023-11-13 DIAGNOSIS — D631 Anemia in chronic kidney disease: Secondary | ICD-10-CM | POA: Diagnosis not present

## 2023-11-13 DIAGNOSIS — N2581 Secondary hyperparathyroidism of renal origin: Secondary | ICD-10-CM | POA: Diagnosis not present

## 2023-11-13 DIAGNOSIS — N186 End stage renal disease: Secondary | ICD-10-CM | POA: Diagnosis not present

## 2023-11-24 DIAGNOSIS — Z992 Dependence on renal dialysis: Secondary | ICD-10-CM | POA: Diagnosis not present

## 2023-11-24 DIAGNOSIS — N186 End stage renal disease: Secondary | ICD-10-CM | POA: Diagnosis not present

## 2023-11-25 DIAGNOSIS — N2581 Secondary hyperparathyroidism of renal origin: Secondary | ICD-10-CM | POA: Diagnosis not present

## 2023-11-25 DIAGNOSIS — D509 Iron deficiency anemia, unspecified: Secondary | ICD-10-CM | POA: Diagnosis not present

## 2023-11-25 DIAGNOSIS — N186 End stage renal disease: Secondary | ICD-10-CM | POA: Diagnosis not present

## 2023-11-25 DIAGNOSIS — D631 Anemia in chronic kidney disease: Secondary | ICD-10-CM | POA: Diagnosis not present

## 2023-11-27 DIAGNOSIS — E1122 Type 2 diabetes mellitus with diabetic chronic kidney disease: Secondary | ICD-10-CM | POA: Diagnosis not present

## 2023-11-28 ENCOUNTER — Encounter (INDEPENDENT_AMBULATORY_CARE_PROVIDER_SITE_OTHER): Admitting: Ophthalmology

## 2023-11-28 DIAGNOSIS — H35033 Hypertensive retinopathy, bilateral: Secondary | ICD-10-CM

## 2023-11-28 DIAGNOSIS — H353211 Exudative age-related macular degeneration, right eye, with active choroidal neovascularization: Secondary | ICD-10-CM

## 2023-11-28 DIAGNOSIS — H43813 Vitreous degeneration, bilateral: Secondary | ICD-10-CM | POA: Diagnosis not present

## 2023-11-28 DIAGNOSIS — I10 Essential (primary) hypertension: Secondary | ICD-10-CM | POA: Diagnosis not present

## 2023-11-28 DIAGNOSIS — H353122 Nonexudative age-related macular degeneration, left eye, intermediate dry stage: Secondary | ICD-10-CM

## 2023-11-28 DIAGNOSIS — H34831 Tributary (branch) retinal vein occlusion, right eye, with macular edema: Secondary | ICD-10-CM

## 2023-12-01 ENCOUNTER — Encounter: Payer: Self-pay | Admitting: Podiatry

## 2023-12-01 ENCOUNTER — Ambulatory Visit (INDEPENDENT_AMBULATORY_CARE_PROVIDER_SITE_OTHER): Admitting: Podiatry

## 2023-12-01 VITALS — Ht 68.0 in | Wt 173.2 lb

## 2023-12-01 DIAGNOSIS — E1140 Type 2 diabetes mellitus with diabetic neuropathy, unspecified: Secondary | ICD-10-CM | POA: Diagnosis not present

## 2023-12-01 DIAGNOSIS — B351 Tinea unguium: Secondary | ICD-10-CM | POA: Diagnosis not present

## 2023-12-02 NOTE — Progress Notes (Signed)
  Subjective:  Patient ID: Leah Walsh, female    DOB: 11-Feb-1941,  MRN: [id number]  Chief Complaint  Patient presents with   Nail Problem    Pt is here for [facility].     83 y.o. female returns for followuo.  Nails are thickened and elongated again, her shoes have been comfortable  Review of Systems: Negative except as noted in the HPI. Denies N/V/F/Ch.   Objective:  There were no vitals filed for this visit. Body mass index is 26.33 kg/m. Constitutional Well developed. Well nourished.  Vascular Foot warm and well perfused. Capillary refill normal to all digits.  Calf is soft and supple, no posterior calf or knee pain, negative Homans' sign  Neurologic Normal speech. Oriented to person, place, and time. Epicritic sensation to light touch grossly absent bilaterally.  Dermatologic Multiple nails are dystrophic thickened elongated yellow discolored and mycotic x 9  Orthopedic: She has no tenderness to palpation noted about the surgical site.  Hammertoe right second toe that is reducible.  She has hallux valgus on the left    Assessment:   1. Onychomycosis   2. Type 2 diabetes mellitus with diabetic neuropathy, unspecified whether long term insulin use (HCC)     Plan:  Patient was evaluated and treated and all questions answered.  Discussed the etiology and treatment options for the condition in detail with the patient. Recommended debridement of the nails today. Sharp and mechanical debridement performed of all painful and mycotic nails today. Nails debrided in length and thickness using a nail nipper to level of comfort.    Return in about 10 weeks (around 02/09/2024) for at risk diabetic foot care.

## 2023-12-05 DIAGNOSIS — R2689 Other abnormalities of gait and mobility: Secondary | ICD-10-CM | POA: Diagnosis not present

## 2023-12-13 ENCOUNTER — Other Ambulatory Visit: Payer: Self-pay | Admitting: Internal Medicine

## 2023-12-16 ENCOUNTER — Ambulatory Visit (HOSPITAL_BASED_OUTPATIENT_CLINIC_OR_DEPARTMENT_OTHER)
Admission: EM | Admit: 2023-12-16 | Discharge: 2023-12-16 | Disposition: A | Discharge status: Home / Self Care | Attending: Family Medicine | Admitting: Family Medicine

## 2023-12-16 ENCOUNTER — Ambulatory Visit (HOSPITAL_BASED_OUTPATIENT_CLINIC_OR_DEPARTMENT_OTHER)
Admit: 2023-12-16 | Discharge: 2023-12-16 | Disposition: A | Discharge status: Home / Self Care | Attending: Family Medicine | Admitting: Family Medicine

## 2023-12-16 ENCOUNTER — Encounter (HOSPITAL_BASED_OUTPATIENT_CLINIC_OR_DEPARTMENT_OTHER): Payer: Self-pay

## 2023-12-16 DIAGNOSIS — W1830XA Fall on same level, unspecified, initial encounter: Secondary | ICD-10-CM | POA: Diagnosis not present

## 2023-12-16 DIAGNOSIS — M79602 Pain in left arm: Secondary | ICD-10-CM | POA: Diagnosis not present

## 2023-12-16 DIAGNOSIS — S4992XA Unspecified injury of left shoulder and upper arm, initial encounter: Secondary | ICD-10-CM | POA: Diagnosis not present

## 2023-12-16 NOTE — Discharge Instructions (Signed)
 No fracture on your xray.  No signs  of cellulitis or infection. Keep the area clean and bandaged.  Follow up for severe pain, redness, swelling or draining of the site.

## 2023-12-16 NOTE — ED Triage Notes (Signed)
 Fell approx 1 week ago. Caught left forearm on door trim. Several skin tears to left forearm. Family member has been putting wound cleaner with neosporin on wounds. Patient states arm is very sore. Patient is very tender with palpation to left forearm.

## 2023-12-16 NOTE — ED Provider Notes (Signed)
 Leah Walsh CARE    CSN: [id number] Arrival date & time: 12/16/23  1452      History   Chief Complaint Chief Complaint  Patient presents with   skin tear    HPI Leah Walsh is a 83 y.o. female.   Patient is a 83 year old female presents today for left arm pain.  She had a fall approximate 1 week ago while she was cleaning the toilet and fell backwards on the left arm.She did not hit her head on injure anything else in the fall.   She does have 3 skin tears to the area.  Her husband has been cleaning the areas.  The whole forearm area is still very tender.  She is chronic kidney disease on dialysis.  She had dialysis today with no problems.  Her dialysis shunt is in the left arm. No fever, chills, numbness, tingling, loss of sensation.      Past Medical History:  Diagnosis Date   Acute renal failure superimposed on stage 4 chronic kidney disease (HCC) 11/24/2018   Acute Respiratory failure with hypoxia (HCC) 11/28/2018   AKI (acute kidney injury) (HCC) 12/21/2018   Anemia in stage 4 chronic kidney disease (HCC) 12/17/2018   Asthma    Asthma, mild persistent 08/06/2011   Benign hypertension with CKD (chronic kidney disease) stage IV (HCC) 08/06/2011   CAP (community acquired pneumonia) 11/24/2018   Chronic diastolic heart failure (HCC) 12/16/2018   Is admitted to [facility] 11/23/2020 12/03/2018 with community-acquired pneumonia.  Her case was complicated by severe anemia hemoglobin 6.9 transfused 8.3 and acute superimposed on stage IV CKD.  In hospital she developed volume overload with her weight rising 3 kg in overt congestive heart failure and required IV diuretics.  Although she was volume overloaded and radiographically was in   Chronic kidney disease, stage 4 (severe) (HCC) 08/06/2011   Coronary artery disease involving native coronary artery of native heart without angina pectoris 07/03/2017   1. ASHD (arteriosclerotic heart disease)  2. Old anteroseptal  myocardial infarction  3. Resolute DES to LAD 04/2013   Review of records from care everywhere [facility] regional cardiology in [place] show a history of known CAD previous anterior septal myocardial infarction and PCI and stent LAD drug-eluting in September 2014.   Diabetes (HCC)    Dyspnea on exertion 08/04/2014   History of coronary disease/MI/stent PFT 11/07/2014-moderate diffusion defect 60% predicted with normal lung volumes and spirometry flows.    Elevated troponin 11/24/2018   Low level 0.8 with pneumonia anemia and severe CKD   GERD (gastroesophageal reflux disease) 10/07/2014   Heart attack (HCC) 2014   History of gastroesophageal reflux (GERD) 08/06/2011   Hyperlipidemia    Hyperphosphatemia 08/22/2016   Hypertension    Hypertensive heart and kidney disease with chronic diastolic congestive heart failure and stage 4 chronic kidney disease (HCC) 11/24/2018   Hypokalemia 02/17/2019   Hypomagnesemia 01/02/2012   Hyponatremia 11/24/2018   Insomnia 08/04/2014   Insulin -requiring or dependent type II diabetes mellitus (HCC) 08/06/2011   Iron deficiency anemia 06/18/2018   Kidney disease    Metabolic bone disease 11/23/2015   Microcytic anemia 01/02/2012   Nausea vomiting and diarrhea 11/24/2018   Obstructive sleep apnea syndrome 08/06/2011   NPSG 12/05/10([place]) AHI 14.2/ hr, weight 178 lbs.   Old MI (myocardial infarction) 03/12/2015   Stent   Other specified disorders of adrenal gland (HCC) 09/24/2011   Pelvic mass 12/20/2016   Overview:  Added automatically from request for surgery [id number]  Formatting of this note might be different from the original. Added automatically from request for surgery [id number]   Renal artery stenosis (HCC) 12/03/2018   She had a duplex in September 2019, report is not in Epic but described in office note November 2019: "She does have probably atherosclerotic occlusion of the left renal artery with a small atrophic left kidney canal right renal artery has a proximal  stenosis there is clearly hemodynamically significant. With her last 2 renal duplex study showing velocities of 340 and then a little bit less than 2   Sleep apnea    Using CPAP   Vitamin D deficiency 01/22/2013    Patient Active Problem List   Diagnosis Date Noted   Chest pain 11/01/2020   CHF exacerbation (HCC) 11/01/2020   COVID-19 virus not detected 11/01/2020   Hypertensive urgency 11/01/2020   Syncope 11/01/2020   Thyroid  nodule 11/01/2020   Anemia due to stage 5 chronic kidney disease (HCC) 10/09/2020   Sleep apnea    Kidney disease    Hypertension    Hyperlipidemia    Diabetes (HCC)    Asthma    Hypotension due to drugs 03/15/2020   Hypokalemia 02/17/2019   AKI (acute kidney injury) (HCC) 12/21/2018   Anemia in stage 4 chronic kidney disease (HCC) 12/17/2018   Chronic diastolic heart failure (HCC) 12/16/2018   Renal artery stenosis (HCC) 12/03/2018   Acute Respiratory failure with hypoxia (HCC) 11/28/2018   Acute renal failure superimposed on stage 4 chronic kidney disease (HCC) 11/24/2018   CAP (community acquired pneumonia) 11/24/2018   Hypertensive heart and kidney disease with chronic diastolic congestive heart failure and stage 4 chronic kidney disease (HCC) 11/24/2018   Hyponatremia 11/24/2018   Nausea vomiting and diarrhea 11/24/2018   Elevated troponin 11/24/2018   Iron deficiency anemia 06/18/2018   Coronary artery disease involving native coronary artery of native heart without angina pectoris 07/03/2017   Pelvic mass 12/20/2016   Hyperphosphatemia 08/22/2016   Metabolic bone disease 11/23/2015   Old MI (myocardial infarction) 03/12/2015   GERD (gastroesophageal reflux disease) 10/07/2014   Insomnia 08/04/2014   Dyspnea on exertion 08/04/2014   Vitamin D deficiency 01/22/2013   Heart attack (HCC) 2014   Microcytic anemia 01/02/2012   Hypomagnesemia 01/02/2012   Other specified disorders of adrenal gland (HCC) 09/24/2011   Obstructive sleep apnea  syndrome 08/06/2011   Asthma, mild persistent 08/06/2011   Insulin -requiring or dependent type II diabetes mellitus (HCC) 08/06/2011   Chronic kidney disease, stage 4 (severe) (HCC) 08/06/2011   History of gastroesophageal reflux (GERD) 08/06/2011   Benign hypertension with CKD (chronic kidney disease) stage IV (HCC) 08/06/2011    Past Surgical History:  Procedure Laterality Date   ABDOMINAL HYSTERECTOMY     CAROTID STENT     CHOLECYSTECTOMY     TUBAL LIGATION     TUMOR REMOVAL  12/2016   Tumor on ovary   VESICOVAGINAL FISTULA CLOSURE W/ TAH      OB History   No obstetric history on file.      Home Medications    Prior to Admission medications   Medication Sig Start Date End Date Taking? Authorizing Provider  albuterol  (VENTOLIN  HFA) 108 (90 Base) MCG/ACT inhaler TAKE 2 PUFFS BY MOUTH EVERY 6 HOURS AS NEEDED FOR WHEEZE OR SHORTNESS OF BREATH 12/13/23   Rosa College D, MD  allopurinol (ZYLOPRIM) 100 MG tablet Take 100 mg by mouth daily. 07/20/14   [provider]  aspirin 81 MG tablet Take 81  mg by mouth at bedtime.     [provider]  atorvastatin  (LIPITOR) 80 MG tablet TAKE 1 TABLET (80 MG TOTAL) BY MOUTH AT BEDTIME. 04/06/19   Munley, Margart Shears, MD  B-D UF III MINI PEN NEEDLES 31G X 5 MM MISC  07/09/14   [provider]  Cholecalciferol (VITAMIN D) 125 MCG (5000 UT) CAPS Take 1 capsule by mouth daily.     [provider]  Cobalamin Combinations (B12 FOLATE PO) Take 800 mcg by mouth daily.    [provider]  Continuous Blood Gluc Receiver (FREESTYLE LIBRE 2 READER) DEVI USE TO check blood sugar 07/17/20   [provider]  Continuous Blood Gluc Sensor (FREESTYLE LIBRE 14 DAY SENSOR) MISC USE every 14 DAYS as directed 12/23/19   [provider]  Ergocalciferol 50 MCG (2000 UT) TABS TUFR 02/12/20   [provider]  ezetimibe  (ZETIA ) 10 MG tablet TAKE 1 TABLET (10 MG TOTAL) BY MOUTH DAILY. 04/06/19   Hassan Links, MD  gabapentin (NEURONTIN) 100 MG capsule Take 100 mg by mouth daily. Patient states she is only taking one tablet a day    [provider]  Insulin  Degludec (TRESIBA FLEXTOUCH [place]) Inject 50 Units into the skin daily.    [provider]  insulin  lispro (HUMALOG) 100 UNIT/ML injection Inject into the skin.    [provider]  labetalol  (NORMODYNE ) 100 MG tablet Take 50 mg by mouth in the morning and at bedtime. 02/17/20   [provider]  losartan (COZAAR) 50 MG tablet Take 50 mg by mouth daily. 07/11/21   [provider]  montelukast  (SINGULAIR ) 10 MG tablet Take 1 tablet (10 mg total) by mouth daily. 04/17/17   Kozlow, Eric J, MD  Multiple Vitamin (MULTIVITAMIN) tablet Take 1 tablet by mouth daily.    [provider]  Multiple Vitamins-Minerals (PRESERVISION AREDS 2 PO) Take 1 capsule by mouth 2 (two) times daily.     [provider]  mupirocin  ointment (BACTROBAN ) 2 % Apply ONE application. topically TWO times daily 04/02/22   McDonald, Olive Better, DPM  nitroGLYCERIN  (NITROSTAT ) 0.4 MG SL tablet PLACE 1 TABLET (0.4 MG TOTAL) UNDER THE TONGUE EVERY 5 (FIVE) MINUTES AS NEEDED FOR CHEST PAIN. 04/27/20   Hassan Links, MD  omeprazole (PRILOSEC) 20 MG capsule 40 mg. 11/23/18   [provider]  potassium chloride  (KLOR-CON ) 10 MEQ tablet Take 10 mEq by mouth every morning. 12/07/19   [provider]  sertraline  (ZOLOFT ) 25 MG tablet Take 25 mg by mouth daily.    [provider]  Torsemide  60 MG TABS Take 1 tablet by mouth daily. 07/22/23 01/18/24  [provider]  traZODone  (DESYREL ) 50 MG tablet TAKE 1 TABLET BY MOUTH AT BEDTIME FOR SLEEP AS NEEDED 01/13/23   Faustina Hood, MD  trimethoprim -polymyxin b (POLYTRIM) ophthalmic solution instill ONE DROP IN THE RIGHT EYE FOUR TIMES DAILY FOR 2 DAYS AFTER each monthly eye injection 01/07/20   [provider]  vitamin B-12 (CYANOCOBALAMIN) 1000 MCG  tablet Take 1,000 mcg by mouth every morning. 01/05/20   [provider]    Family History Family History  Problem Relation Age of Onset   Heart disease Mother    Heart disease Father    Heart disease Maternal Grandfather    Heart disease Maternal Grandmother    Heart disease Paternal Grandfather    Heart disease Paternal Grandmother    Lung cancer Brother    Diabetes  Sister    Diabetes Sister     Social History Social History   Tobacco Use   Smoking status: Never   Smokeless tobacco: Never  Vaping Use   Vaping status: Never Used  Substance Use Topics   Alcohol use: No    Alcohol/week: 0.0 standard drinks of alcohol   Drug use: No     Allergies   Compazine [prochlorperazine edisylate], Colcrys [colchicine], Hydrocodone , and Codeine   Review of Systems Review of Systems See HPI  Physical Exam Triage Vital Signs ED Triage Vitals  Encounter Vitals Group     BP 12/16/23 1508 (!) 127/53     Systolic BP Percentile --      Diastolic BP Percentile --      Pulse Rate 12/16/23 1508 62     Resp 12/16/23 1508 20     Temp 12/16/23 1508 98.1 F (36.7 C)     Temp Source 12/16/23 1508 Oral     SpO2 12/16/23 1508 93 %     Weight --      Height --      Head Circumference --      Peak Flow --      Pain Score 12/16/23 1509 8     Pain Loc --      Pain Education --      Exclude from Growth Chart --    No data found.  Updated Vital Signs BP (!) 127/53 (BP Location: Right Arm)   Pulse 62   Temp 98.1 F (36.7 C) (Oral)   Resp 20   SpO2 93%   Visual Acuity Right Eye Distance:   Left Eye Distance:   Bilateral Distance:    Right Eye Near:   Left Eye Near:    Bilateral Near:     Physical Exam Constitutional:      Appearance: Normal appearance.  Pulmonary:     Effort: Pulmonary effort is normal.  Musculoskeletal:        General: Tenderness and signs of injury present.     Comments: 3 small skin tears to the right forearm posterior. No redness,  swelling, deformity. Pt pointing to bone where the pain is.  Dialysis shunt to upper arm. Thrill palpated.   Neurological:     Mental Status: She is alert.  Psychiatric:        Mood and Affect: Mood normal.      UC Treatments / Results  Labs (all labs ordered are listed, but only abnormal results are displayed) Labs Reviewed - No data to display  EKG   Radiology DG Forearm Left Result Date: 12/16/2023 CLINICAL DATA:  arm injury from fall EXAM: LEFT FOREARM - 2 VIEW COMPARISON:  November 29, 2008 FINDINGS: There is no evidence of fracture or other focal bone lesions. Soft tissues are unremarkable. IMPRESSION: No acute fracture or dislocation. Electronically Signed   By: Rance Burrows M.D.   On: 12/16/2023 15:46    Procedures Procedures (including critical care time)  Medications Ordered in UC Medications - No data to display  Initial Impression / Assessment and Plan / UC Course  I have reviewed the triage vital signs and the nursing notes.  Pertinent labs & imaging results that were available during my care of the patient were reviewed by me and considered in my medical decision making (see chart for details).     Arm pain- no signs of infection on exam. Area of skin tears clean. Wrapped here in the clinic with non stick gauze and  kerlex.  Xray negative for fractures.  Return precautions given.  Final Clinical Impressions(s) / UC Diagnoses   Final diagnoses:  Pain of left upper extremity     Discharge Instructions      No fracture on your xray.  No signs  of cellulitis or infection. Keep the area clean and bandaged.  Follow up for severe pain, redness, swelling or draining of the site.       ED Prescriptions   None    PDMP not reviewed this encounter.   Landa Pine, FNP 12/16/23 [phone]

## 2023-12-24 DIAGNOSIS — Z992 Dependence on renal dialysis: Secondary | ICD-10-CM | POA: Diagnosis not present

## 2023-12-24 DIAGNOSIS — N186 End stage renal disease: Secondary | ICD-10-CM | POA: Diagnosis not present

## 2023-12-25 DIAGNOSIS — N186 End stage renal disease: Secondary | ICD-10-CM | POA: Diagnosis not present

## 2023-12-25 DIAGNOSIS — D509 Iron deficiency anemia, unspecified: Secondary | ICD-10-CM | POA: Diagnosis not present

## 2023-12-25 DIAGNOSIS — D631 Anemia in chronic kidney disease: Secondary | ICD-10-CM | POA: Diagnosis not present

## 2023-12-25 DIAGNOSIS — N2581 Secondary hyperparathyroidism of renal origin: Secondary | ICD-10-CM | POA: Diagnosis not present

## 2023-12-26 ENCOUNTER — Encounter (INDEPENDENT_AMBULATORY_CARE_PROVIDER_SITE_OTHER): Admitting: Ophthalmology

## 2023-12-26 DIAGNOSIS — I10 Essential (primary) hypertension: Secondary | ICD-10-CM

## 2023-12-26 DIAGNOSIS — H353122 Nonexudative age-related macular degeneration, left eye, intermediate dry stage: Secondary | ICD-10-CM

## 2023-12-26 DIAGNOSIS — H353211 Exudative age-related macular degeneration, right eye, with active choroidal neovascularization: Secondary | ICD-10-CM

## 2023-12-26 DIAGNOSIS — H34831 Tributary (branch) retinal vein occlusion, right eye, with macular edema: Secondary | ICD-10-CM | POA: Diagnosis not present

## 2023-12-26 DIAGNOSIS — H35033 Hypertensive retinopathy, bilateral: Secondary | ICD-10-CM | POA: Diagnosis not present

## 2023-12-26 DIAGNOSIS — H43813 Vitreous degeneration, bilateral: Secondary | ICD-10-CM | POA: Diagnosis not present

## 2023-12-29 DIAGNOSIS — L03114 Cellulitis of left upper limb: Secondary | ICD-10-CM | POA: Diagnosis not present

## 2023-12-29 DIAGNOSIS — Z6825 Body mass index (BMI) 25.0-25.9, adult: Secondary | ICD-10-CM | POA: Diagnosis not present

## 2024-01-01 DIAGNOSIS — E1122 Type 2 diabetes mellitus with diabetic chronic kidney disease: Secondary | ICD-10-CM | POA: Diagnosis not present

## 2024-01-05 DIAGNOSIS — L03114 Cellulitis of left upper limb: Secondary | ICD-10-CM | POA: Diagnosis not present

## 2024-01-09 DIAGNOSIS — Z6825 Body mass index (BMI) 25.0-25.9, adult: Secondary | ICD-10-CM | POA: Diagnosis not present

## 2024-01-09 DIAGNOSIS — L03114 Cellulitis of left upper limb: Secondary | ICD-10-CM | POA: Diagnosis not present

## 2024-01-23 ENCOUNTER — Encounter (INDEPENDENT_AMBULATORY_CARE_PROVIDER_SITE_OTHER): Admitting: Ophthalmology

## 2024-01-23 DIAGNOSIS — I10 Essential (primary) hypertension: Secondary | ICD-10-CM

## 2024-01-23 DIAGNOSIS — H34831 Tributary (branch) retinal vein occlusion, right eye, with macular edema: Secondary | ICD-10-CM | POA: Diagnosis not present

## 2024-01-23 DIAGNOSIS — H43813 Vitreous degeneration, bilateral: Secondary | ICD-10-CM | POA: Diagnosis not present

## 2024-01-23 DIAGNOSIS — H353211 Exudative age-related macular degeneration, right eye, with active choroidal neovascularization: Secondary | ICD-10-CM | POA: Diagnosis not present

## 2024-01-23 DIAGNOSIS — H35033 Hypertensive retinopathy, bilateral: Secondary | ICD-10-CM

## 2024-01-23 DIAGNOSIS — H353122 Nonexudative age-related macular degeneration, left eye, intermediate dry stage: Secondary | ICD-10-CM

## 2024-01-24 DIAGNOSIS — Z992 Dependence on renal dialysis: Secondary | ICD-10-CM | POA: Diagnosis not present

## 2024-01-24 DIAGNOSIS — N186 End stage renal disease: Secondary | ICD-10-CM | POA: Diagnosis not present

## 2024-01-27 DIAGNOSIS — D509 Iron deficiency anemia, unspecified: Secondary | ICD-10-CM | POA: Diagnosis not present

## 2024-01-27 DIAGNOSIS — N186 End stage renal disease: Secondary | ICD-10-CM | POA: Diagnosis not present

## 2024-01-27 DIAGNOSIS — D631 Anemia in chronic kidney disease: Secondary | ICD-10-CM | POA: Diagnosis not present

## 2024-01-27 DIAGNOSIS — N2581 Secondary hyperparathyroidism of renal origin: Secondary | ICD-10-CM | POA: Diagnosis not present

## 2024-01-29 DIAGNOSIS — E1122 Type 2 diabetes mellitus with diabetic chronic kidney disease: Secondary | ICD-10-CM | POA: Diagnosis not present

## 2024-02-09 ENCOUNTER — Ambulatory Visit: Admitting: Podiatry

## 2024-02-09 DIAGNOSIS — I132 Hypertensive heart and chronic kidney disease with heart failure and with stage 5 chronic kidney disease, or end stage renal disease: Secondary | ICD-10-CM | POA: Diagnosis not present

## 2024-02-09 DIAGNOSIS — I2510 Atherosclerotic heart disease of native coronary artery without angina pectoris: Secondary | ICD-10-CM | POA: Diagnosis not present

## 2024-02-09 DIAGNOSIS — Z955 Presence of coronary angioplasty implant and graft: Secondary | ICD-10-CM | POA: Diagnosis not present

## 2024-02-09 DIAGNOSIS — I252 Old myocardial infarction: Secondary | ICD-10-CM | POA: Diagnosis not present

## 2024-02-09 DIAGNOSIS — Z79899 Other long term (current) drug therapy: Secondary | ICD-10-CM | POA: Diagnosis not present

## 2024-02-09 DIAGNOSIS — E1122 Type 2 diabetes mellitus with diabetic chronic kidney disease: Secondary | ICD-10-CM | POA: Diagnosis not present

## 2024-02-09 DIAGNOSIS — Z794 Long term (current) use of insulin: Secondary | ICD-10-CM | POA: Diagnosis not present

## 2024-02-09 DIAGNOSIS — I5033 Acute on chronic diastolic (congestive) heart failure: Secondary | ICD-10-CM | POA: Diagnosis not present

## 2024-02-09 DIAGNOSIS — N186 End stage renal disease: Secondary | ICD-10-CM | POA: Diagnosis not present

## 2024-02-09 DIAGNOSIS — G4733 Obstructive sleep apnea (adult) (pediatric): Secondary | ICD-10-CM | POA: Diagnosis not present

## 2024-02-09 DIAGNOSIS — Z992 Dependence on renal dialysis: Secondary | ICD-10-CM | POA: Diagnosis not present

## 2024-02-09 DIAGNOSIS — E7800 Pure hypercholesterolemia, unspecified: Secondary | ICD-10-CM | POA: Diagnosis not present

## 2024-02-20 ENCOUNTER — Encounter (INDEPENDENT_AMBULATORY_CARE_PROVIDER_SITE_OTHER): Admitting: Ophthalmology

## 2024-02-20 DIAGNOSIS — H35033 Hypertensive retinopathy, bilateral: Secondary | ICD-10-CM

## 2024-02-20 DIAGNOSIS — H34831 Tributary (branch) retinal vein occlusion, right eye, with macular edema: Secondary | ICD-10-CM | POA: Diagnosis not present

## 2024-02-20 DIAGNOSIS — I10 Essential (primary) hypertension: Secondary | ICD-10-CM | POA: Diagnosis not present

## 2024-02-20 DIAGNOSIS — H353122 Nonexudative age-related macular degeneration, left eye, intermediate dry stage: Secondary | ICD-10-CM | POA: Diagnosis not present

## 2024-02-20 DIAGNOSIS — H43813 Vitreous degeneration, bilateral: Secondary | ICD-10-CM | POA: Diagnosis not present

## 2024-02-20 DIAGNOSIS — H353211 Exudative age-related macular degeneration, right eye, with active choroidal neovascularization: Secondary | ICD-10-CM

## 2024-02-23 DIAGNOSIS — N186 End stage renal disease: Secondary | ICD-10-CM | POA: Diagnosis not present

## 2024-02-23 DIAGNOSIS — Z992 Dependence on renal dialysis: Secondary | ICD-10-CM | POA: Diagnosis not present

## 2024-02-24 DIAGNOSIS — D631 Anemia in chronic kidney disease: Secondary | ICD-10-CM | POA: Diagnosis not present

## 2024-02-24 DIAGNOSIS — N186 End stage renal disease: Secondary | ICD-10-CM | POA: Diagnosis not present

## 2024-02-24 DIAGNOSIS — D509 Iron deficiency anemia, unspecified: Secondary | ICD-10-CM | POA: Diagnosis not present

## 2024-02-24 DIAGNOSIS — N2581 Secondary hyperparathyroidism of renal origin: Secondary | ICD-10-CM | POA: Diagnosis not present

## 2024-02-26 DIAGNOSIS — E1122 Type 2 diabetes mellitus with diabetic chronic kidney disease: Secondary | ICD-10-CM | POA: Diagnosis not present

## 2024-03-19 ENCOUNTER — Encounter (INDEPENDENT_AMBULATORY_CARE_PROVIDER_SITE_OTHER): Admitting: Ophthalmology

## 2024-03-19 DIAGNOSIS — H35033 Hypertensive retinopathy, bilateral: Secondary | ICD-10-CM | POA: Diagnosis not present

## 2024-03-19 DIAGNOSIS — H353211 Exudative age-related macular degeneration, right eye, with active choroidal neovascularization: Secondary | ICD-10-CM | POA: Diagnosis not present

## 2024-03-19 DIAGNOSIS — I10 Essential (primary) hypertension: Secondary | ICD-10-CM

## 2024-03-19 DIAGNOSIS — H353122 Nonexudative age-related macular degeneration, left eye, intermediate dry stage: Secondary | ICD-10-CM

## 2024-03-19 DIAGNOSIS — H43813 Vitreous degeneration, bilateral: Secondary | ICD-10-CM

## 2024-03-19 DIAGNOSIS — H34831 Tributary (branch) retinal vein occlusion, right eye, with macular edema: Secondary | ICD-10-CM | POA: Diagnosis not present

## 2024-03-22 ENCOUNTER — Ambulatory Visit (INDEPENDENT_AMBULATORY_CARE_PROVIDER_SITE_OTHER): Admitting: Podiatry

## 2024-03-22 VITALS — Ht 68.0 in | Wt 173.0 lb

## 2024-03-22 DIAGNOSIS — M21612 Bunion of left foot: Secondary | ICD-10-CM

## 2024-03-22 DIAGNOSIS — M2012 Hallux valgus (acquired), left foot: Secondary | ICD-10-CM

## 2024-03-22 DIAGNOSIS — M2042 Other hammer toe(s) (acquired), left foot: Secondary | ICD-10-CM | POA: Diagnosis not present

## 2024-03-22 NOTE — Progress Notes (Signed)
  Subjective:  Patient ID: Leah Walsh, female    DOB: Jun 26, 1941,  MRN: [id number]  Chief Complaint  Patient presents with   Nail Problem    Rm 21 Patient is here diabetic foot care. No nail trimming today.  Patient is concerned about pain in the left index toe due to rubbing against the left hallux.     83 y.o. female returns for follow up.  Review of Systems: Negative except as noted in the HPI. Denies N/V/F/Ch.   Objective:  There were no vitals filed for this visit. Body mass index is 26.3 kg/m. Constitutional Well developed. Well nourished.  Vascular Foot warm and well perfused. Capillary refill normal to all digits.  Calf is soft and supple, no posterior calf or knee pain, negative Homans' sign  Neurologic Normal speech. Oriented to person, place, and time. Epicritic sensation to light touch grossly absent bilaterally.  Dermatologic Nails relatively normal length and appearance today  Orthopedic: Left foot hammertoe and hallux valgus deformity, no ulceration on either foot    Assessment:   1. Hallux valgus with bunions, left   2. Hammertoe of left foot      Plan:  Patient was evaluated and treated and all questions answered.  Issue today was hallux abutting the second toe on the left foot.  I recommend nonsurgical treatment with a silicone and foam toe spacer this was dispensed and she will purchase these on [employer].  She will follow-up in 6 months for reevaluation.   Return in about 6 months (around 09/22/2024) for diabetic foot exam.

## 2024-03-25 DIAGNOSIS — Z992 Dependence on renal dialysis: Secondary | ICD-10-CM | POA: Diagnosis not present

## 2024-03-25 DIAGNOSIS — N186 End stage renal disease: Secondary | ICD-10-CM | POA: Diagnosis not present

## 2024-03-27 DIAGNOSIS — D509 Iron deficiency anemia, unspecified: Secondary | ICD-10-CM | POA: Diagnosis not present

## 2024-03-27 DIAGNOSIS — N186 End stage renal disease: Secondary | ICD-10-CM | POA: Diagnosis not present

## 2024-03-27 DIAGNOSIS — N2581 Secondary hyperparathyroidism of renal origin: Secondary | ICD-10-CM | POA: Diagnosis not present

## 2024-03-27 DIAGNOSIS — D631 Anemia in chronic kidney disease: Secondary | ICD-10-CM | POA: Diagnosis not present

## 2024-04-01 DIAGNOSIS — E1122 Type 2 diabetes mellitus with diabetic chronic kidney disease: Secondary | ICD-10-CM | POA: Diagnosis not present

## 2024-04-02 ENCOUNTER — Other Ambulatory Visit: Payer: Self-pay | Admitting: Internal Medicine

## 2024-04-03 DIAGNOSIS — E1122 Type 2 diabetes mellitus with diabetic chronic kidney disease: Secondary | ICD-10-CM | POA: Diagnosis not present

## 2024-04-06 DIAGNOSIS — J189 Pneumonia, unspecified organism: Secondary | ICD-10-CM | POA: Diagnosis not present

## 2024-04-06 DIAGNOSIS — R0602 Shortness of breath: Secondary | ICD-10-CM | POA: Diagnosis not present

## 2024-04-06 DIAGNOSIS — R509 Fever, unspecified: Secondary | ICD-10-CM | POA: Diagnosis not present

## 2024-04-14 DIAGNOSIS — J90 Pleural effusion, not elsewhere classified: Secondary | ICD-10-CM | POA: Diagnosis not present

## 2024-04-16 ENCOUNTER — Encounter (INDEPENDENT_AMBULATORY_CARE_PROVIDER_SITE_OTHER): Admitting: Ophthalmology

## 2024-04-16 DIAGNOSIS — H43813 Vitreous degeneration, bilateral: Secondary | ICD-10-CM | POA: Diagnosis not present

## 2024-04-16 DIAGNOSIS — H353122 Nonexudative age-related macular degeneration, left eye, intermediate dry stage: Secondary | ICD-10-CM

## 2024-04-16 DIAGNOSIS — H34831 Tributary (branch) retinal vein occlusion, right eye, with macular edema: Secondary | ICD-10-CM | POA: Diagnosis not present

## 2024-04-16 DIAGNOSIS — H35033 Hypertensive retinopathy, bilateral: Secondary | ICD-10-CM | POA: Diagnosis not present

## 2024-04-16 DIAGNOSIS — H353211 Exudative age-related macular degeneration, right eye, with active choroidal neovascularization: Secondary | ICD-10-CM | POA: Diagnosis not present

## 2024-04-16 DIAGNOSIS — I10 Essential (primary) hypertension: Secondary | ICD-10-CM

## 2024-04-25 DIAGNOSIS — N186 End stage renal disease: Secondary | ICD-10-CM | POA: Diagnosis not present

## 2024-04-25 DIAGNOSIS — Z992 Dependence on renal dialysis: Secondary | ICD-10-CM | POA: Diagnosis not present

## 2024-04-26 DIAGNOSIS — Z992 Dependence on renal dialysis: Secondary | ICD-10-CM | POA: Diagnosis not present

## 2024-04-26 DIAGNOSIS — N186 End stage renal disease: Secondary | ICD-10-CM | POA: Diagnosis not present

## 2024-04-27 DIAGNOSIS — N2581 Secondary hyperparathyroidism of renal origin: Secondary | ICD-10-CM | POA: Diagnosis not present

## 2024-04-27 DIAGNOSIS — N186 End stage renal disease: Secondary | ICD-10-CM | POA: Diagnosis not present

## 2024-04-27 DIAGNOSIS — D509 Iron deficiency anemia, unspecified: Secondary | ICD-10-CM | POA: Diagnosis not present

## 2024-04-27 DIAGNOSIS — D631 Anemia in chronic kidney disease: Secondary | ICD-10-CM | POA: Diagnosis not present

## 2024-05-03 DIAGNOSIS — H40013 Open angle with borderline findings, low risk, bilateral: Secondary | ICD-10-CM | POA: Diagnosis not present

## 2024-05-14 ENCOUNTER — Encounter (INDEPENDENT_AMBULATORY_CARE_PROVIDER_SITE_OTHER): Admitting: Ophthalmology

## 2024-05-14 DIAGNOSIS — H353211 Exudative age-related macular degeneration, right eye, with active choroidal neovascularization: Secondary | ICD-10-CM

## 2024-05-14 DIAGNOSIS — H35033 Hypertensive retinopathy, bilateral: Secondary | ICD-10-CM | POA: Diagnosis not present

## 2024-05-14 DIAGNOSIS — H353122 Nonexudative age-related macular degeneration, left eye, intermediate dry stage: Secondary | ICD-10-CM | POA: Diagnosis not present

## 2024-05-14 DIAGNOSIS — H34831 Tributary (branch) retinal vein occlusion, right eye, with macular edema: Secondary | ICD-10-CM

## 2024-05-14 DIAGNOSIS — I10 Essential (primary) hypertension: Secondary | ICD-10-CM | POA: Diagnosis not present

## 2024-05-14 DIAGNOSIS — H43813 Vitreous degeneration, bilateral: Secondary | ICD-10-CM

## 2024-05-26 DIAGNOSIS — Z992 Dependence on renal dialysis: Secondary | ICD-10-CM | POA: Diagnosis not present

## 2024-05-27 DIAGNOSIS — D509 Iron deficiency anemia, unspecified: Secondary | ICD-10-CM | POA: Diagnosis not present

## 2024-05-27 DIAGNOSIS — N186 End stage renal disease: Secondary | ICD-10-CM | POA: Diagnosis not present

## 2024-05-27 DIAGNOSIS — E1122 Type 2 diabetes mellitus with diabetic chronic kidney disease: Secondary | ICD-10-CM | POA: Diagnosis not present

## 2024-05-27 DIAGNOSIS — N2581 Secondary hyperparathyroidism of renal origin: Secondary | ICD-10-CM | POA: Diagnosis not present

## 2024-05-27 DIAGNOSIS — D631 Anemia in chronic kidney disease: Secondary | ICD-10-CM | POA: Diagnosis not present

## 2024-06-14 ENCOUNTER — Encounter (INDEPENDENT_AMBULATORY_CARE_PROVIDER_SITE_OTHER): Admitting: Ophthalmology

## 2024-06-14 DIAGNOSIS — H35033 Hypertensive retinopathy, bilateral: Secondary | ICD-10-CM | POA: Diagnosis not present

## 2024-06-14 DIAGNOSIS — H353122 Nonexudative age-related macular degeneration, left eye, intermediate dry stage: Secondary | ICD-10-CM

## 2024-06-14 DIAGNOSIS — H34831 Tributary (branch) retinal vein occlusion, right eye, with macular edema: Secondary | ICD-10-CM | POA: Diagnosis not present

## 2024-06-14 DIAGNOSIS — I10 Essential (primary) hypertension: Secondary | ICD-10-CM

## 2024-06-14 DIAGNOSIS — H353211 Exudative age-related macular degeneration, right eye, with active choroidal neovascularization: Secondary | ICD-10-CM

## 2024-06-14 DIAGNOSIS — H43813 Vitreous degeneration, bilateral: Secondary | ICD-10-CM | POA: Diagnosis not present

## 2024-06-26 DIAGNOSIS — N186 End stage renal disease: Secondary | ICD-10-CM | POA: Diagnosis not present

## 2024-06-26 DIAGNOSIS — D509 Iron deficiency anemia, unspecified: Secondary | ICD-10-CM | POA: Diagnosis not present

## 2024-06-26 DIAGNOSIS — Z992 Dependence on renal dialysis: Secondary | ICD-10-CM | POA: Diagnosis not present

## 2024-06-26 DIAGNOSIS — N2581 Secondary hyperparathyroidism of renal origin: Secondary | ICD-10-CM | POA: Diagnosis not present

## 2024-06-26 DIAGNOSIS — D631 Anemia in chronic kidney disease: Secondary | ICD-10-CM | POA: Diagnosis not present

## 2024-07-06 DIAGNOSIS — E1122 Type 2 diabetes mellitus with diabetic chronic kidney disease: Secondary | ICD-10-CM | POA: Diagnosis not present

## 2024-07-14 ENCOUNTER — Encounter (INDEPENDENT_AMBULATORY_CARE_PROVIDER_SITE_OTHER): Admitting: Ophthalmology

## 2024-07-14 DIAGNOSIS — H34831 Tributary (branch) retinal vein occlusion, right eye, with macular edema: Secondary | ICD-10-CM | POA: Diagnosis not present

## 2024-07-14 DIAGNOSIS — H43813 Vitreous degeneration, bilateral: Secondary | ICD-10-CM

## 2024-07-14 DIAGNOSIS — H353211 Exudative age-related macular degeneration, right eye, with active choroidal neovascularization: Secondary | ICD-10-CM

## 2024-07-14 DIAGNOSIS — I10 Essential (primary) hypertension: Secondary | ICD-10-CM

## 2024-07-14 DIAGNOSIS — H35033 Hypertensive retinopathy, bilateral: Secondary | ICD-10-CM | POA: Diagnosis not present

## 2024-07-14 DIAGNOSIS — H353122 Nonexudative age-related macular degeneration, left eye, intermediate dry stage: Secondary | ICD-10-CM | POA: Diagnosis not present

## 2024-07-26 DIAGNOSIS — I5033 Acute on chronic diastolic (congestive) heart failure: Secondary | ICD-10-CM | POA: Diagnosis not present

## 2024-07-26 DIAGNOSIS — Z955 Presence of coronary angioplasty implant and graft: Secondary | ICD-10-CM | POA: Diagnosis not present

## 2024-07-26 DIAGNOSIS — Z992 Dependence on renal dialysis: Secondary | ICD-10-CM | POA: Diagnosis not present

## 2024-07-26 DIAGNOSIS — N186 End stage renal disease: Secondary | ICD-10-CM | POA: Diagnosis not present

## 2024-07-26 DIAGNOSIS — I130 Hypertensive heart and chronic kidney disease with heart failure and stage 1 through stage 4 chronic kidney disease, or unspecified chronic kidney disease: Secondary | ICD-10-CM | POA: Diagnosis not present

## 2024-07-26 DIAGNOSIS — R052 Subacute cough: Secondary | ICD-10-CM | POA: Diagnosis not present

## 2024-07-26 DIAGNOSIS — I2510 Atherosclerotic heart disease of native coronary artery without angina pectoris: Secondary | ICD-10-CM | POA: Diagnosis not present

## 2024-07-26 DIAGNOSIS — E7800 Pure hypercholesterolemia, unspecified: Secondary | ICD-10-CM | POA: Diagnosis not present

## 2024-08-11 ENCOUNTER — Encounter (INDEPENDENT_AMBULATORY_CARE_PROVIDER_SITE_OTHER): Admitting: Ophthalmology

## 2024-08-11 DIAGNOSIS — H353122 Nonexudative age-related macular degeneration, left eye, intermediate dry stage: Secondary | ICD-10-CM | POA: Diagnosis not present

## 2024-08-11 DIAGNOSIS — H353211 Exudative age-related macular degeneration, right eye, with active choroidal neovascularization: Secondary | ICD-10-CM

## 2024-08-11 DIAGNOSIS — H43813 Vitreous degeneration, bilateral: Secondary | ICD-10-CM | POA: Diagnosis not present

## 2024-08-11 DIAGNOSIS — H34831 Tributary (branch) retinal vein occlusion, right eye, with macular edema: Secondary | ICD-10-CM

## 2024-08-11 DIAGNOSIS — I10 Essential (primary) hypertension: Secondary | ICD-10-CM

## 2024-08-11 DIAGNOSIS — H35033 Hypertensive retinopathy, bilateral: Secondary | ICD-10-CM | POA: Diagnosis not present

## 2024-09-06 ENCOUNTER — Encounter: Payer: Self-pay | Admitting: Pulmonary Disease

## 2024-09-06 ENCOUNTER — Ambulatory Visit: Admitting: Pulmonary Disease

## 2024-09-06 VITALS — BP 140/60 | HR 62 | Temp 98.2°F | Ht 67.0 in | Wt 161.0 lb

## 2024-09-06 DIAGNOSIS — G4733 Obstructive sleep apnea (adult) (pediatric): Secondary | ICD-10-CM | POA: Diagnosis not present

## 2024-09-06 DIAGNOSIS — F5101 Primary insomnia: Secondary | ICD-10-CM | POA: Diagnosis not present

## 2024-09-06 MED ORDER — TRAZODONE HCL 50 MG PO TABS: ORAL_TABLET | ORAL | 4 refills | Status: AC

## 2024-09-06 NOTE — Patient Instructions (Addendum)
" °  Please schedule a follow up appointment with me when you check out today.  ----------   You may wish to try halving your dose of trazodone  at night to see if this improves the unsteadiness on your feet during the daytime that you described. If this does not help your unsteadiness and you find you have more difficulty sleeping on the lower dose of trazodone , you may return to your current dose (which is 50 mg nightly). "

## 2024-09-06 NOTE — Progress Notes (Signed)
 "  Established Patient Pulmonology Office Visit   Subjective:  Patient ID: Leah Walsh, female    DOB: 02/01/1941   MRN: [id number]  CC:  Chief Complaint  Patient presents with   Obstructive Sleep Apnea    Yearly visit     Leah Walsh is a 84 y.o. patient with OSA, insomnia, CAD c/b MI s/p PCI, HFpEF, T2DM, HTN, ESRD on iHD who presents for follow up.  Insomnia: - Has been on trazodone  50 mg at bedtime which is working well for her. - She does report some unsteadiness on her feet during the daytime and wonders if this is possibly an effect of the trazodone .  OSA: - Uses CPAP 5-15 cm H2O. - DME: [facility].  CPAP Data: - Usage days >\= 4h: 83% - Median usage (days used): 5 hrs 2 mins - Pressure: Median 7.5, 95% 10.1 - Leak: Median 6.1, 95% 34.4 - Device-estimated AHI: 0.6  Pulm Questionnaires:      No data to display          Asthma Control Test ACT Total Score  12/10/2017  4:00 PM 15  04/23/2017  3:00 PM 13  04/17/2017 11:00 AM 15      ROS  Current Medications[1]      Objective:  BP (!) 140/60   Pulse 62   Temp 98.2 F (36.8 C) (Oral)   Ht 5' 7 (1.702 m)   Wt 161 lb (73 kg)   SpO2 90% Comment: RA  BMI 25.22 kg/m    Physical Exam Vitals reviewed.  Constitutional:      Appearance: Normal appearance.  HENT:     Head: Normocephalic and atraumatic.     Mouth/Throat:     Mouth: Mucous membranes are moist.     Pharynx: Oropharynx is clear. No oropharyngeal exudate or posterior oropharyngeal erythema.     Comments: Several missing teeth. Eyes:     General: No scleral icterus.       Right eye: No discharge.        Left eye: No discharge.     Conjunctiva/sclera: Conjunctivae normal.  Cardiovascular:     Rate and Rhythm: Normal rate and regular rhythm.     Heart sounds: Murmur heard.     No friction rub. No gallop.     Comments: II/VI holosystolic murmur at ULSB Pulmonary:     Effort: Pulmonary effort is normal. No respiratory distress.      Breath sounds: No stridor. No wheezing, rhonchi or rales.  Neurological:     Mental Status: She is alert and oriented to person, place, and time. Mental status is at baseline.  Psychiatric:        Behavior: Behavior normal.        Thought Content: Thought content normal.        Judgment: Judgment normal.      Diagnostic Review:    Serum HCO3: CO2  Date/Time Value Ref Range Status  02/10/2020 05:01 PM 23 22 - 32 mmol/L Final  12/03/2018 09:16 AM 26 22 - 32 mmol/L Final  12/02/2018 02:47 AM 22 22 - 32 mmol/L Final    Hemoglobin A1c: No results found for: HGBA1C  TSH: No results found for: TSH  Iron Studies: Ferritin  Date/Time Value Ref Range Status  11/24/2018 09:37 AM 74 11 - 307 ng/mL Final    Comment:    Performed at [facility], [address], [place], [place] [address]    ABG:    Component Value Date/Time  PHART 7.408 11/27/2018 1020   PCO2ART 32.5 11/27/2018 1020   PO2ART 59.0 (L) 11/27/2018 1020   HCO3 20.1 11/27/2018 1020   ACIDBASEDEF 3.8 (H) 11/27/2018 1020   O2SAT 90.4 11/27/2018 1020     VBG: No results found for: PHVEN, PCO2VEN, PO2VEN      Assessment & Plan:   Assessment & Plan OSA (obstructive sleep apnea) Continue CPAP 5-15 cm H2O with nasal pillows mask (no chin strap). DME: [facility] Patient continues to benefit from ongoing use of CPAP therapy. Congratulated patient on adherence to CPAP use.    Primary insomnia Patient will try reducing trazodone  dose to 25 mg at bedtime to see if this improves the unsteadiness on her feet during the daytime which may or may not be related to the trazodone . She denies any trips to bathroom during the night so unsteadiness at that time has not been observed.      Return in about 1 year (around 09/06/2025) for Follow up: CPAP.   Time spent on day of this encounter (includes time spent face-to-face with the patient as well as time spent the same day as the encounter reviewing  existing data and notes, and/or documenting my findings and the plan of care): 35 minutes  Leah JINNY Dales, MD     [1]  Current Outpatient Medications:    albuterol  (VENTOLIN  HFA) 108 (90 Base) MCG/ACT inhaler, TAKE 2 PUFFS BY MOUTH EVERY 6 HOURS AS NEEDED FOR WHEEZE OR SHORTNESS OF BREATH, Disp: 18 each, Rfl: 6   allopurinol (ZYLOPRIM) 100 MG tablet, Take 100 mg by mouth daily., Disp: , Rfl: 3   aspirin 81 MG tablet, Take 81 mg by mouth at bedtime. , Disp: , Rfl:    atorvastatin  (LIPITOR) 80 MG tablet, TAKE 1 TABLET (80 MG TOTAL) BY MOUTH AT BEDTIME., Disp: 90 tablet, Rfl: 1   B-D UF III MINI PEN NEEDLES 31G X 5 MM MISC, , Disp: , Rfl: 6   Cholecalciferol (VITAMIN D) 125 MCG (5000 UT) CAPS, Take 1 capsule by mouth daily. , Disp: , Rfl:    Cobalamin Combinations (B12 FOLATE PO), Take 800 mcg by mouth daily., Disp: , Rfl:    Continuous Blood Gluc Receiver (FREESTYLE LIBRE 2 READER) DEVI, USE TO check blood sugar, Disp: , Rfl:    Continuous Blood Gluc Sensor (FREESTYLE LIBRE 14 DAY SENSOR) MISC, USE every 14 DAYS as directed, Disp: , Rfl:    Ergocalciferol 50 MCG (2000 UT) TABS, TUFR, Disp: , Rfl:    ezetimibe  (ZETIA ) 10 MG tablet, TAKE 1 TABLET (10 MG TOTAL) BY MOUTH DAILY., Disp: 90 tablet, Rfl: 1   gabapentin (NEURONTIN) 100 MG capsule, Take 100 mg by mouth daily. Patient states she is only taking one tablet a day (Patient taking differently: Take 100 mg by mouth daily. PRN), Disp: , Rfl:    Insulin  Degludec (TRESIBA FLEXTOUCH [place]), Inject 50 Units into the skin daily., Disp: , Rfl:    insulin  lispro (HUMALOG) 100 UNIT/ML injection, Inject into the skin., Disp: , Rfl:    labetalol  (NORMODYNE ) 100 MG tablet, Take 50 mg by mouth in the morning and at bedtime., Disp: , Rfl:    losartan (COZAAR) 50 MG tablet, Take 50 mg by mouth daily., Disp: , Rfl:    montelukast  (SINGULAIR ) 10 MG tablet, Take 1 tablet (10 mg total) by mouth daily., Disp: 30 tablet, Rfl: 5   Multiple Vitamin (MULTIVITAMIN)  tablet, Take 1 tablet by mouth daily., Disp: , Rfl:    Multiple Vitamins-Minerals (  PRESERVISION AREDS 2 PO), Take 1 capsule by mouth 2 (two) times daily. , Disp: , Rfl:    nitroGLYCERIN  (NITROSTAT ) 0.4 MG SL tablet, PLACE 1 TABLET (0.4 MG TOTAL) UNDER THE TONGUE EVERY 5 (FIVE) MINUTES AS NEEDED FOR CHEST PAIN., Disp: 25 tablet, Rfl: 3   omeprazole (PRILOSEC) 20 MG capsule, 40 mg., Disp: , Rfl:    potassium chloride  (KLOR-CON ) 10 MEQ tablet, Take 10 mEq by mouth every morning., Disp: , Rfl:    sertraline  (ZOLOFT ) 25 MG tablet, Take 25 mg by mouth daily., Disp: , Rfl:    traZODone  (DESYREL ) 50 MG tablet, TAKE 1 TABLET BY MOUTH AT BEDTIME FOR SLEEP AS NEEDED, Disp: 90 tablet, Rfl: 4   trimethoprim -polymyxin b (POLYTRIM) ophthalmic solution, instill ONE DROP IN THE RIGHT EYE FOUR TIMES DAILY FOR 2 DAYS AFTER each monthly eye injection, Disp: , Rfl:    vitamin B-12 (CYANOCOBALAMIN) 1000 MCG tablet, Take 1,000 mcg by mouth every morning., Disp: , Rfl:    mupirocin  ointment (BACTROBAN ) 2 %, Apply ONE application. topically TWO times daily, Disp: 22 g, Rfl: 2  "

## 2024-09-06 NOTE — Assessment & Plan Note (Addendum)
 Patient will try reducing trazodone  dose to 25 mg at bedtime to see if this improves the unsteadiness on her feet during the daytime which may or may not be related to the trazodone . She denies any trips to bathroom during the night so unsteadiness at that time has not been observed.

## 2024-09-13 ENCOUNTER — Encounter (INDEPENDENT_AMBULATORY_CARE_PROVIDER_SITE_OTHER): Admitting: Ophthalmology

## 2024-09-13 DIAGNOSIS — H43813 Vitreous degeneration, bilateral: Secondary | ICD-10-CM | POA: Diagnosis not present

## 2024-09-13 DIAGNOSIS — H353231 Exudative age-related macular degeneration, bilateral, with active choroidal neovascularization: Secondary | ICD-10-CM | POA: Diagnosis not present

## 2024-09-13 DIAGNOSIS — I10 Essential (primary) hypertension: Secondary | ICD-10-CM

## 2024-09-13 DIAGNOSIS — H34831 Tributary (branch) retinal vein occlusion, right eye, with macular edema: Secondary | ICD-10-CM | POA: Diagnosis not present

## 2024-09-13 DIAGNOSIS — H35033 Hypertensive retinopathy, bilateral: Secondary | ICD-10-CM | POA: Diagnosis not present

## 2024-10-11 ENCOUNTER — Encounter (INDEPENDENT_AMBULATORY_CARE_PROVIDER_SITE_OTHER): Admitting: Ophthalmology
